# Patient Record
Sex: Male | Born: 1975
Health system: Southern US, Community
[De-identification: ages and names within clinical notes are randomized; demographics above are authoritative.]

## PROBLEM LIST (undated history)

## (undated) DIAGNOSIS — Z8669 Personal history of other diseases of the nervous system and sense organs: Secondary | ICD-10-CM

## (undated) DIAGNOSIS — F909 Attention-deficit hyperactivity disorder, unspecified type: Secondary | ICD-10-CM

## (undated) HISTORY — PX: NASAL SEPTUM SURGERY: SHX37

## (undated) HISTORY — DX: Attention-deficit hyperactivity disorder, unspecified type: F90.9

---

## 2007-07-04 ENCOUNTER — Ambulatory Visit: Admission: RE | Admit: 2007-07-04 | Payer: Self-pay | Source: Ambulatory Visit | Admitting: Plastic Surgery

## 2011-04-16 NOTE — Op Note (Unsigned)
SURGEON:             Babs Bertin, MD      ADMITTED:            07/04/2007      PROCEDURE DATE: 07/04/2007      ROOM NUMBER:         [id number]            CLINICAL NOTE:  This is a pleasant, 35 year old gentleman who has an      annoying enlarging mass above his left brow.  He comes to the operating      room for excision of this.            The patient fully understands the nature of the proposed procedure.  He      understands the possibility of bleeding, infection, scarring, necessity for      secondary surgery, distortion, problems with wound healing, recurrence and      other risks.  Having thus been duly informed, the patient comes in for      indicated procedure.            PREOPERATIVE DIAGNOSIS:  Lipomatous mass, left brow.            PROCEDURE:  Excision of mass.            POSTOPERATIVE DIAGNOSIS:  Lipomatous mass, left brow.            ANESTHESIA:  Local.            OPERATIVE TECHNIQUE:  This patient's face was appropriately prepped and      draped.  An incision was made over the mass.  Deeper dissection was done.      It was submuscular and deep.  This lipomatous-like mass was delivered into      the wound from its deep location over the periosteum and submitted for      pathological study.            Irrigation was done and hemostasis made to be perfect.  Closure was done in      a multilayered plastic manner.  An appropriate dressing was placed, and the      patient returned to the holding area in good condition.            This procedure was done under local anesthesia.  It did go quite smoothly      and a good result is certainly hoped for.  The patient was instructed on      the local care of the wound and will be followed in my office as an      outpatient.                                    ___________________________________     Date Signed: _______________      Babs Bertin, MD                  D 07/04/2007  2:03 P; T 07/05/2007  7:52 A; 4132 - - , [id number], #[id number]      CC:   Moody Bruins, MD           Babs Bertin, MD

## 2011-05-10 ENCOUNTER — Ambulatory Visit (INDEPENDENT_AMBULATORY_CARE_PROVIDER_SITE_OTHER): Payer: No Typology Code available for payment source | Admitting: Family

## 2011-05-10 ENCOUNTER — Ambulatory Visit (INDEPENDENT_AMBULATORY_CARE_PROVIDER_SITE_OTHER): Payer: Self-pay | Admitting: Family Medicine

## 2011-05-10 ENCOUNTER — Encounter (INDEPENDENT_AMBULATORY_CARE_PROVIDER_SITE_OTHER): Payer: Self-pay | Admitting: Family

## 2011-05-10 VITALS — BP 128/84 | HR 79 | Temp 97.8°F | Resp 16 | Ht 68.0 in | Wt 155.8 lb

## 2011-05-10 DIAGNOSIS — J029 Acute pharyngitis, unspecified: Secondary | ICD-10-CM

## 2011-05-10 LAB — POCT RAPID STREP A

## 2011-05-10 MED ORDER — MAGIC MOUTHWASH ORAL SUSPENSION
15.0000 mL | Freq: Three times a day (TID) | ORAL | Status: DC | PRN
Start: 2011-05-10 — End: 2012-02-04

## 2011-05-10 MED ORDER — AMOXICILLIN 875 MG PO TABS
875.00 mg | ORAL_TABLET | Freq: Two times a day (BID) | ORAL | Status: AC
Start: 2011-05-10 — End: 2011-05-20

## 2011-05-10 NOTE — Progress Notes (Signed)
Subjective:       Patient ID: Christopher Trevino is a 35 y.o. male.    Sore Throat   This is a new problem. The current episode started yesterday. The problem has been gradually worsening. There has been no fever. The pain is at a severity of 10/10. The pain is severe. Pertinent negatives include no abdominal pain, congestion, coughing, ear pain, headaches, shortness of breath, trouble swallowing or vomiting. He has had exposure to strep. He has tried nothing for the symptoms. The treatment provided no relief.       The following portions of the patient's history were reviewed and updated as appropriate: allergies, current medications, past family history, past medical history, past social history, past surgical history and problem list.    Review of Systems   Constitutional: Negative for fever, chills and fatigue.   HENT: Negative for ear pain, congestion and trouble swallowing.    Respiratory: Negative for cough and shortness of breath.    Gastrointestinal: Negative for vomiting and abdominal pain.   Musculoskeletal: Positive for myalgias.   Neurological: Negative for headaches.   All other systems reviewed and are negative.            Objective:    Physical Exam   Vitals reviewed.  Constitutional: He is oriented to person, place, and time. He appears well-developed and well-nourished. No distress.   HENT:   Head: Normocephalic.   Right Ear: Tympanic membrane normal.   Left Ear: Tympanic membrane normal.   Mouth/Throat: Uvula is midline and mucous membranes are normal. Posterior oropharyngeal erythema present.   Cardiovascular: Normal rate, regular rhythm and normal heart sounds.    Pulmonary/Chest: Effort normal and breath sounds normal.   Neurological: He is alert and oriented to person, place, and time.   Skin: Skin is warm and dry.   Psychiatric: He has a normal mood and affect. His behavior is normal.           Assessment:       1. Sore throat  Rapid Group A Strep, Upper Respiratory Culture, amoxicillin (AMOXIL)  875 MG tablet, DiphenhydrAMINE HCl (MAGIC MOUTHWASE) suspension           Plan:       Will call with throat culture results  Follow up with PCP within the next week.  Return sooner for any new or concerning symptoms.  Do not take amoxicillin unless you spike a fever of greater than 100.4 or we call you with a positive throat culture.      MEDICATION: AMOXICILLIN  You have been prescribed amoxicillin. Brand names for this drug include: Amoxil and Trimox. This is an antibiotic related to penicillin.  DIRECTIONS FOR USE:  Amoxicillin may be taken with food to prevent upset stomach. Take the medicine at regular intervals. If the label says "every 8 hours", this means3 times per day. Doses do not have to be exactly eight hours apart, but should be spaced out evenly three times a day.  Take all of the medicine until it is gone, even if you are feeling better. This will ensure that the infection is completely cleared up.  WHAT TO WATCH FOR:  POSSIBLE SIDE EFFECTS: Nausea, diarrhea, abdominal pain (contact your doctor if any of these symptoms persist or become severe). Vaginal itching or discharge (contact your doctor).  ALLERGIC REACTION: Rash, itching, swelling, trouble breathing or swallowing (contact your doctor or return to this facility promptly).  MEDICAL CONDITIONS: Before starting this medicine, be sure your  doctor knows if you have any of the following conditions:   Allergic reaction to cephalosporin or penicillin-type drugs in the past   Current infection with mononucleosis   Breastfeeding  DRUG INTERACTION: If you are already taking any of the following medicines, be sure to notify your doctor before taking this drug:   Allopurinol, methotrexate, birth control pills (see below)  WARNING: In rare cases, this medicine may block the effect of birth control pills. Therefore, if you are a woman using birth control pills, you should use another form of contraception during the menstrual cycle(s) in which you are  taking this medicine. Do not stop taking your birth control pills.  [NOTE: This information topic may not include all directions, precautions, medical conditions, drug/food interactions and warnings for this drug. Check with your doctor, nurse, or pharmacist for any questions that you may have.]   2000-2011 [employer], [address] [place], [place] [address]. All rights reserved. This information is not intended as a substitute for professional medical care. Always follow your healthcare professional's instructions.    Self-Care for Sore Throats  Call Your Doctor If You Have:   A temperature over 101.51F   White spots on the throat   Great difficulty swallowing    Trouble breathing   A skin rash   Recent exposure to someone else with strep bacteria   Severe hoarseness and swollen glands in the neck or jaw   Sore throats occur for many reasons, such as colds, allergies, and infections caused by viruses or bacteria. In any case, your throat becomes red and sore. Your goal for self-care is to reduce your discomfort while giving your throat a chance to heal.  Moisten and Soothe Your Throat   Try a sip of water first thing after waking up.   Keep your throat moist by drinking 6 or more glasses of clear liquids every day.   Run a cool-air humidifier in your room overnight.   Suck on throat lozenges, cough drops, hard candy, ice chips, or frozen fruit-juice bars.  Gargle to Ease Irritation  Gargling every hour or two can ease irritation. Try gargling with one of these solutions:   1/4 teaspoon of salt in 1/2 cup of warm water   An over-the-counter anesthetic gargle    Use Medication for More Relief  Over-the-counter medication can reduce sore throat symptoms. Ask your pharmacist if you have questions about which medication to use.   Ease pain with anesthetic sprays. Aspirin or an aspirin substitute also helps. Remember, never give aspirin to anyone 56 or younger.   For sore throats caused by  allergies, try antihistamines to block the allergic reaction.   Remember: unless a sore throat is caused by a bacterial infection, antibiotics won't help you.  Prevent Future Sore Throats   Stop smoking or reduce contact with secondhand smoke. Smoke irritates the tender throat lining.   Limit contact with pets and with allergy-causing substances such as pollen and mold.   When you're around someone with a sore throat or cold, wash your hands frequently to keep viruses or bacteria from spreading.   Don't strain your vocal cords.   [address] [place], [place] [address]. All rights reserved. This information is not intended as a substitute for professional medical care. Always follow your healthcare professional's instructions.

## 2011-05-10 NOTE — Patient Instructions (Addendum)
Will call with throat culture results  Follow up with PCP within the next week.  Return sooner for any new or concerning symptoms.  Do not take amoxicillin unless you spike a fever of greater than 100.4 or we call you with a positive throat culture.      MEDICATION: AMOXICILLIN  You have been prescribed amoxicillin. Brand names for this drug include: Amoxil and Trimox. This is an antibiotic related to penicillin.  DIRECTIONS FOR USE:  Amoxicillin may be taken with food to prevent upset stomach. Take the medicine at regular intervals. If the label says "every 8 hours", this means3 times per day. Doses do not have to be exactly eight hours apart, but should be spaced out evenly three times a day.  Take all of the medicine until it is gone, even if you are feeling better. This will ensure that the infection is completely cleared up.  WHAT TO WATCH FOR:  POSSIBLE SIDE EFFECTS: Nausea, diarrhea, abdominal pain (contact your doctor if any of these symptoms persist or become severe). Vaginal itching or discharge (contact your doctor).  ALLERGIC REACTION: Rash, itching, swelling, trouble breathing or swallowing (contact your doctor or return to this facility promptly).  MEDICAL CONDITIONS: Before starting this medicine, be sure your doctor knows if you have any of the following conditions:   Allergic reaction to cephalosporin or penicillin-type drugs in the past   Current infection with mononucleosis   Breastfeeding  DRUG INTERACTION: If you are already taking any of the following medicines, be sure to notify your doctor before taking this drug:   Allopurinol, methotrexate, birth control pills (see below)  WARNING: In rare cases, this medicine may block the effect of birth control pills. Therefore, if you are a woman using birth control pills, you should use another form of contraception during the menstrual cycle(s) in which you are taking this medicine. Do not stop taking your birth control pills.  [NOTE: This  information topic may not include all directions, precautions, medical conditions, drug/food interactions and warnings for this drug. Check with your doctor, nurse, or pharmacist for any questions that you may have.]   2000-2011 [employer], [address], [place], [place] [address]. All rights reserved. This information is not intended as a substitute for professional medical care. Always follow your healthcare professional's instructions.    Self-Care for Sore Throats  Call Your Doctor If You Have:   A temperature over 101.66F   White spots on the throat   Great difficulty swallowing    Trouble breathing   A skin rash   Recent exposure to someone else with strep bacteria   Severe hoarseness and swollen glands in the neck or jaw   Sore throats occur for many reasons, such as colds, allergies, and infections caused by viruses or bacteria. In any case, your throat becomes red and sore. Your goal for self-care is to reduce your discomfort while giving your throat a chance to heal.  Moisten and Soothe Your Throat   Try a sip of water first thing after waking up.   Keep your throat moist by drinking 6 or more glasses of clear liquids every day.   Run a cool-air humidifier in your room overnight.   Suck on throat lozenges, cough drops, hard candy, ice chips, or frozen fruit-juice bars.  Gargle to Ease Irritation  Gargling every hour or two can ease irritation. Try gargling with one of these solutions:   1/4 teaspoon of salt in 1/2 cup of warm  water   An over-the-counter anesthetic gargle    Use Medication for More Relief  Over-the-counter medication can reduce sore throat symptoms. Ask your pharmacist if you have questions about which medication to use.   Ease pain with anesthetic sprays. Aspirin or an aspirin substitute also helps. Remember, never give aspirin to anyone 78 or younger.   For sore throats caused by allergies, try antihistamines to block the allergic reaction.   Remember: unless a sore  throat is caused by a bacterial infection, antibiotics won't help you.  Prevent Future Sore Throats   Stop smoking or reduce contact with secondhand smoke. Smoke irritates the tender throat lining.   Limit contact with pets and with allergy-causing substances such as pollen and mold.   When you're around someone with a sore throat or cold, wash your hands frequently to keep viruses or bacteria from spreading.   Don't strain your vocal cords.   [address], [address], [place], [place] [address]. All rights reserved. This information is not intended as a substitute for professional medical care. Always follow your healthcare professional's instructions.

## 2011-05-12 LAB — UPPER RESPIRATORY CULTURE

## 2011-05-13 ENCOUNTER — Telehealth (INDEPENDENT_AMBULATORY_CARE_PROVIDER_SITE_OTHER): Payer: Self-pay

## 2011-05-14 NOTE — Telephone Encounter (Signed)
#   listed is disconnected so unable to leave a message on labs

## 2011-07-19 ENCOUNTER — Other Ambulatory Visit (INDEPENDENT_AMBULATORY_CARE_PROVIDER_SITE_OTHER): Payer: Self-pay

## 2011-07-19 NOTE — Telephone Encounter (Signed)
Please advise pt that I do not refill adderall prescription without office visit. Pt will need to be seen before getting rx, due to nature of Rx being controlled category.

## 2011-07-23 ENCOUNTER — Encounter (INDEPENDENT_AMBULATORY_CARE_PROVIDER_SITE_OTHER): Payer: Self-pay | Admitting: Family Medicine

## 2011-07-23 ENCOUNTER — Ambulatory Visit (INDEPENDENT_AMBULATORY_CARE_PROVIDER_SITE_OTHER): Payer: BC Managed Care – PPO | Admitting: Family Medicine

## 2011-07-23 VITALS — BP 132/86 | HR 92 | Ht 67.9 in | Wt 161.2 lb

## 2011-07-23 DIAGNOSIS — F988 Other specified behavioral and emotional disorders with onset usually occurring in childhood and adolescence: Secondary | ICD-10-CM

## 2011-07-23 MED ORDER — AMPHETAMINE-DEXTROAMPHET ER 15 MG PO CP24
15.00 mg | ORAL_CAPSULE | Freq: Every morning | ORAL | Status: DC
Start: 2011-07-23 — End: 2011-08-22

## 2011-07-23 NOTE — Progress Notes (Signed)
Subjective:       Patient ID: Christopher Trevino is a 36 y.o. male.    HPI  This is 36 y/o male with hx of ADD here for first visit with me. Pt states he's taken Adderall during teenage years but has been off for long time. Lately been having difficulty with organizing as well as focusing in afternoons. Pt states he has been involved with multiple projects at same time and having increased stresses as well.   The following portions of the patient's history were reviewed and updated as appropriate: allergies, current medications, past family history, past medical history, past social history, past surgical history and problem list.    Review of Systems  No acute fatigue, no significiant weight changes. Pt denies HA/visual changes/dizziness. No chest pain/SOB/palpitation symptoms. Denies any GI changes. No acute neurologic changes.         Objective:    Physical Exam  Filed Vitals:    07/23/11 1609   BP: 132/86   Pulse: 92   Height: 1.725 m (5' 7.9")   Weight: 73.12 kg (161 lb 3.2 oz)     Normal affect and conversation. NAD. CV: RRR, no murmur or gallops. Resp: CTAB, no rhonchi or crackles. Abdom: Soft, NT/ND. Positive BS. No masses. Ext: no edema. Neuro: Grossly intact.         Assessment:       Encounter Diagnosis   Name Primary?   . Attention deficit disorder (ADD) Yes           Plan:       1. Will start Adderall XR 15mg  daily, take at 9am.   2. Discussed potential effect of insomnia and appetite changes.   3. Return in 1 month for follow up.     Current outpatient prescriptions:amphetamine-dextroamphetamine (ADDERALL XR, 15MG ,) 15 MG 24 hr capsule, Take 1 capsule (15 mg total) by mouth every morning., Disp: 30 capsule, Rfl: 0;  DiphenhydrAMINE HCl (MAGIC MOUTHWASE) suspension, Take 15 mLs by mouth 3 (three) times daily as needed., Disp: 120 mL, Rfl: 0

## 2011-07-27 ENCOUNTER — Telehealth (INDEPENDENT_AMBULATORY_CARE_PROVIDER_SITE_OTHER): Payer: Self-pay

## 2011-07-27 NOTE — Telephone Encounter (Signed)
PA approved for Adderall XR 15 mg daily Dx: ADD.  Pt not currently or recently on MAOI.  Effective 07-27-2011 to 07-26-2012 per [facility]im[facility] @ [facility]

## 2011-07-27 NOTE — Telephone Encounter (Signed)
Message copied by Nonda Lou on Tue Jul 27, 2011  2:44 PM  ------       Message from: Brunilda Payor       Created: Mon Jul 26, 2011  9:38 AM       Regarding: prior auth        Contact: [phone]         PRIOR AUTH REQUIRED- the pharmacy told him to call us and give the nurse the number for [facility].               amphetamine-dextroamphetamine (ADDERALL XR, 15MG ,) 15 MG 24 hr capsule              [facility] [phone]              See's Selena Batten

## 2011-07-28 ENCOUNTER — Encounter (INDEPENDENT_AMBULATORY_CARE_PROVIDER_SITE_OTHER): Payer: Self-pay

## 2011-09-07 ENCOUNTER — Ambulatory Visit (INDEPENDENT_AMBULATORY_CARE_PROVIDER_SITE_OTHER): Payer: No Typology Code available for payment source | Admitting: Family Medicine

## 2011-09-07 ENCOUNTER — Encounter (INDEPENDENT_AMBULATORY_CARE_PROVIDER_SITE_OTHER): Payer: Self-pay | Admitting: Family Medicine

## 2011-09-07 VITALS — BP 136/94 | HR 88 | Wt 157.0 lb

## 2011-09-07 DIAGNOSIS — F988 Other specified behavioral and emotional disorders with onset usually occurring in childhood and adolescence: Secondary | ICD-10-CM

## 2011-09-07 DIAGNOSIS — F909 Attention-deficit hyperactivity disorder, unspecified type: Secondary | ICD-10-CM

## 2011-09-07 DIAGNOSIS — R002 Palpitations: Secondary | ICD-10-CM

## 2011-09-07 MED ORDER — AMPHETAMINE-DEXTROAMPHET ER 15 MG PO CP24
15.00 mg | ORAL_CAPSULE | Freq: Every morning | ORAL | Status: DC
Start: 2011-11-07 — End: 2011-12-07

## 2011-09-07 MED ORDER — AMPHETAMINE-DEXTROAMPHET ER 15 MG PO CP24
15.00 mg | ORAL_CAPSULE | Freq: Every morning | ORAL | Status: DC
Start: 2011-09-07 — End: 2011-10-07

## 2011-09-07 MED ORDER — AMPHETAMINE-DEXTROAMPHET ER 15 MG PO CP24
15.00 mg | ORAL_CAPSULE | Freq: Every morning | ORAL | Status: DC
Start: 2011-10-08 — End: 2011-11-07

## 2011-09-07 NOTE — Patient Instructions (Signed)
VIRAL GASTROENTERITIS (6yr-Adult)    Gastroenteritis is another name for the"stomach flu."It is most often caused by a virus that affects the stomach and intestinal tract. Symptoms include stomach cramping and fever, vomiting and/or diarrhea, and can last from 2 to 7 days.  The danger from repeated vomiting or diarrhea is dehydration. This is the loss of too much water and minerals from the body. When this occurs, body fluids must be replaced. Antibiotics are not effective for this illness, but simple home treatment will be helpful.  HOME CARE   If symptoms are severe, rest at home for the next 24 hours.   Avoid tobacco, caffeine, and alcohol use, which can worsen symptoms.   Acetaminophen (Tylenol) or ibuprofen (Motrin, Advil) may be usedfor fever or pain unless another medication was prescribed. NOTE: If you have chronic liver or kidney disease or ever had a stomach ulcer or GI bleeding, talk with your doctor before using these medicines. Aspirin should never be used in anyone under 18 years of age who is ill with a fever. It may cause severe liver damage.   If medicines for diarrhea or vomiting were prescribed, be sure they are takenonly as directed.   If vomiting, drink small amounts of clear fluids (such as water, sports drinks, clear sodas) at frequent intervals to prevent dehydration. Start with 1 to 2 tablespoons every 10 minutes. Once vomiting stops, follow these guidelines:  DURING THE FIRST 12 to 24 HOURSfollow the diet below:   Beverages: Sport drinks like Gatorade, soft drinks without caffeine; ginger ale, mineral water (plain or flavored), decaffeinated tea and coffee.   Soups: Clear broth, consomm and bouillon   Desserts: Plain gelatin (Jell-O), Popsicles and fruit juice bars.  DURING THE NEXT 24 HOURSyou may add the following to the above:   Hot cereal, plain toast, bread, rolls, crackers   Plain noodles, rice, mashed potatoes, chicken noodle or rice soup   Unsweetened canned  fruit (avoid pineapple), bananas   Limit fat intake to less than 15 grams per day by avoiding margarine, butter, oils, mayonnaise, sauces, gravies, fried foods, peanut butter, meat, poultry, and fish.   Limit fiber; avoid raw or cooked vegetables, fresh fruits (except bananas), and bran cereals.   Limit caffeine and chocolate. Do not use spices or seasonings except salt.  DURING THE NEXT 24 HOURS  The patient can gradually resume a normal diet as symptoms lessen.  PREVENTING SPREAD   Hand washing with soap and water is the best way to prevent the spread of viruses. Caregivers should wash their hands before andafter touching the sick person.   The sick person, as well as everyone in the family,should wash their hands after using the toilet and before meals.   Clean the toilet after each use.   People with diarrhea should not prepare food for others. If you are preparing your own foods, wash your hands before and after.  FOLLOW UP with your doctor as advised. Call your doctor if you are not improving over the next 2 to 3 days. If a stool (diarrhea) sample was taken, you may call in 2 days (or as directed) for the results.  GET PROMPT MEDICAL ATTENTION if any of the following occur:   Increasing abdominal pain   Continued vomiting (unable to keep liquids down)   Frequent diarrhea (more than 5 times a day)   Blood in vomit or stool (black or red color)   Dark urine, reduced urine output, or extreme thirst   Weakness,   dizziness, fainting   Drowsiness, confusion, stiff neck, or seizure   Fever of 100.4F (38C) oral or higher, not better with fever medication   New rash   2000-2011 [employer], [address], [place] [address]. All rights reserved. This information is not intended as a substitute for professional medical care. Always follow your healthcare professional's instructions.

## 2011-09-07 NOTE — Progress Notes (Signed)
Subjective:       Patient ID: Christopher Trevino is a 36 y.o. male.    HPI  This is 36 y/o male with hx of ADD here for follow up. Last seen 1 month ago, was started on Adderall XR 15mg  daily last visit. Pt states he was feeling some anxiety with palpitation last few days, improved after decreasing caffinated beverages. No CP/SOB.   Pt denies any appetite changes, no symptoms of insomnia issues. Pt states that he has been able to concentrate better, and more productive at work. Able to focus more clearly during tasks.     The following portions of the patient's history were reviewed and updated as appropriate: allergies, current medications, past family history, past medical history, past social history, past surgical history and problem list.    Review of Systems  No acute fatigue, no significiant weight changes. Pt denies HA/visual changes/dizziness. No chest pain/SOB/palpitation symptoms. Denies any GI changes. No acute neurologic changes.         Objective:    Physical Exam  BP 136/94  Pulse 88  Wt 71.215 kg (157 lb)  Repeat 124/80     NAD. CV: RRR, no murmur or gallops. Resp: CTAB, no rhonchi or crackles. Abdom: Soft, NT/ND. Positive BS. No masses. Ext: no edema. Neuro: Grossly intact.      Assessment:          Encounter Diagnosis   Name Primary?   . ADD (attention deficit disorder with hyperactivity) Yes     # Palpitation - likely due to caffinated beverages.      Plan:       1. Will continue adderall at same dose.   2. Advised to decrease stimulatory beverages.   3. Return in 3 months for follow up.   I have spent 25 minutes during this visit, more than half of visit spent on patient education and instructions.     Current outpatient prescriptions:amphetamine-dextroamphetamine (ADDERALL XR, 15MG ,) 15 MG 24 hr capsule, Take 1 capsule (15 mg total) by mouth every morning., Disp: 30 capsule, Rfl: 0;  amphetamine-dextroamphetamine (ADDERALL XR, 15MG ,) 15 MG 24 hr capsule, Take 1 capsule (15 mg total) by mouth  every morning., Disp: 30 capsule, Rfl: 0  amphetamine-dextroamphetamine (ADDERALL XR, 15MG ,) 15 MG 24 hr capsule, Take 1 capsule (15 mg total) by mouth every morning., Disp: 30 capsule, Rfl: 0;  DiphenhydrAMINE HCl (MAGIC MOUTHWASE) suspension, Take 15 mLs by mouth 3 (three) times daily as needed., Disp: 120 mL, Rfl: 0

## 2011-11-29 ENCOUNTER — Telehealth (INDEPENDENT_AMBULATORY_CARE_PROVIDER_SITE_OTHER): Payer: Self-pay

## 2011-11-29 ENCOUNTER — Other Ambulatory Visit (INDEPENDENT_AMBULATORY_CARE_PROVIDER_SITE_OTHER): Payer: Self-pay | Admitting: Family Medicine

## 2011-11-29 DIAGNOSIS — J0100 Acute maxillary sinusitis, unspecified: Secondary | ICD-10-CM

## 2011-11-29 MED ORDER — CEFUROXIME AXETIL 500 MG PO TABS
500.00 mg | ORAL_TABLET | Freq: Two times a day (BID) | ORAL | Status: AC
Start: 2011-11-29 — End: 2011-12-09

## 2011-11-29 NOTE — Telephone Encounter (Signed)
Pt adv rx sent to pharm, adv to follow up in 4-5 days if no better.

## 2011-11-29 NOTE — Telephone Encounter (Signed)
Message copied by Tomie China on Mon Nov 29, 2011 12:28 PM  ------       Message from: Brunilda Payor       Created: Mon Nov 29, 2011  8:41 AM       Regarding: antibiotic needed today        Contact: [phone]         Sinus Infection; head pressure, stuffy nose, drainage. Cant come in today, requested an antibiotic called in today               See's Kim               [facility] - [place], [place] - [address] [facility] [address] [phone] (Phone)

## 2011-11-29 NOTE — Telephone Encounter (Signed)
Please advise, thank you, [name]

## 2011-11-29 NOTE — Telephone Encounter (Signed)
Please advise pt that Rx for Ceftin abx transmitted for him to take for 10 days. If not improved in 4-5 days, need to be seen in office.

## 2011-12-10 ENCOUNTER — Encounter (INDEPENDENT_AMBULATORY_CARE_PROVIDER_SITE_OTHER): Payer: BC Managed Care – PPO | Admitting: Family Medicine

## 2011-12-10 ENCOUNTER — Encounter (INDEPENDENT_AMBULATORY_CARE_PROVIDER_SITE_OTHER): Payer: Self-pay | Admitting: Family Medicine

## 2012-02-04 ENCOUNTER — Ambulatory Visit (INDEPENDENT_AMBULATORY_CARE_PROVIDER_SITE_OTHER): Payer: No Typology Code available for payment source | Admitting: Family Medicine

## 2012-02-04 ENCOUNTER — Encounter (INDEPENDENT_AMBULATORY_CARE_PROVIDER_SITE_OTHER): Payer: Self-pay | Admitting: Family Medicine

## 2012-02-04 VITALS — BP 137/83 | HR 88 | Temp 98.3°F | Resp 18 | Ht 67.0 in | Wt 152.0 lb

## 2012-02-04 DIAGNOSIS — Z0000 Encounter for general adult medical examination without abnormal findings: Secondary | ICD-10-CM

## 2012-02-04 DIAGNOSIS — F419 Anxiety disorder, unspecified: Secondary | ICD-10-CM

## 2012-02-04 DIAGNOSIS — F411 Generalized anxiety disorder: Secondary | ICD-10-CM

## 2012-02-04 MED ORDER — SERTRALINE HCL 25 MG PO TABS
25.00 mg | ORAL_TABLET | Freq: Every day | ORAL | Status: DC
Start: 2012-02-04 — End: 2012-09-01

## 2012-02-04 NOTE — Progress Notes (Signed)
Have you self referred yourself since we last saw you? No  -Refer to care team-   Or  List Specialists:    __________________________________________________________          1.  Over the last two weeks, have you been bothered by feeling  down, depressed, or hopeless?  No    2.  Over the last two weeks, have you been bothered by little  interest or pleasure in doing things? No        Scoring:    Not at all: 0  Several days: 1  More than half the days:2  Nearly every day: 3      ONLY IF A 3 IS SCORED ON EITHER OF THE 2 QUESTIONS, THEN GIVE OUT PHQ9

## 2012-02-04 NOTE — Progress Notes (Signed)
Subjective:       Patient ID: Christopher Trevino is a 36 y.o. male.    HPI  This is  36 y/o male with hx of  ADD ,here for CPE. Last PE  1   years ago. Pt state that he is having increased anxiety at work, does not think Adderall is helping too much with his anxiety, may be worsening.     Diet: decreased appetite. Good dinners. Cereal in am. Packs lunch.    Exercise: riding bike. 10-18 miles few times per week.   Sleep: gets about 6-7 hours. occ with stresses.   Mood: some anxiety due to stress. Mental draining but no depression.   Smoking: none             Etoh: beer per night     Illicit drugs: none  Tdap:   Flu Vaccine: none last year.     The following portions of the patient's history were reviewed and updated as appropriate:   No Known Allergies    Past Medical History   Diagnosis Date   . ADHD (attention deficit hyperactivity disorder)    . Depression screening negative 02-04-12     Neg       History     Social History   . Marital Status: Married     Spouse Name: N/A     Number of Children: N/A   . Years of Education: N/A     Occupational History   . Not on file.     Social History Main Topics   . Smoking status: Former Smoker -- 0.5 packs/day for 3 years     Quit date: 05/10/1999   . Smokeless tobacco: Not on file   . Alcohol Use: 3.6 oz/week     6 Cans of beer per week   . Drug Use:    . Sexually Active:      Other Topics Concern   . Not on file     Social History Narrative   . No narrative on file       History reviewed. No pertinent past surgical history.    Patient Active Problem List   Diagnosis   . ADD (attention deficit disorder) without hyperactivity       Current outpatient prescriptions:amphetamine-dextroamphetamine (ADDERALL) 10 MG tablet, Active, Take 10 mg by mouth daily., Disp: , Rfl: ;  sertraline (ZOLOFT) 25 MG tablet, Active, Take 1 tablet (25 mg total) by mouth daily., Disp: 30 tablet, Rfl: 2;  DiphenhydrAMINE HCl (MAGIC MOUTHWASE) suspension, Discontinued, Take 15 mLs by mouth 3 (three) times  daily as needed., Disp: 120 mL, Rfl: 0      Review of Systems  No acute fatigue, no significiant weight changes. Pt denies HA/visual changes/dizziness. No chest pain/SOB/palpitation symptoms. Denies any GI changes. No acute neurologic changes. All other systems were reviewed and was negative.         Objective:    Physical Exam  BP 137/83  Pulse 88  Temp(Src) 98.3 F (36.8 C) (Oral)  Resp 18  Ht 1.702 m (5\' 7" )  Wt 68.947 kg (152 lb)  BMI 23.81 kg/m2  BMI    NAD. HEENT: PERRL. CV: RRR, no murmur or gallops. Resp: CTAB, no rhonchi or crackles. Abdom: Soft, NT/ND. Positive BS. No masses. Ext: no edema. No LAD in cervical/axillary/inguinal area. GU: normal genitals as well as testicular exam bilaterally. No evidence of inguinal hernia.  Neuro: CNII-XII intact, sensation intact. MS 5/5 upper and lower extremities symmetrically.  Assessment:       Encounter Diagnoses   Name Primary?   Marland Kitchen Anxiety    . Routine general medical examination at a health care facility Yes         Plan:       1. Tdap, pt will return to get it done.   2. We have discussed with patient concerning age appropriate health risk factors such as smoking, alcohol intake, and also advised on appropriate diet and exercise patterns. Recommended age appropriate screening exams including yearly PSA, FOBT screening and flu vaccines. Pt was advised to return for repeat PE in 1-2 years for follow up.   3. Will start Zoloft 25mg  daily. Will stop Adderall as pt wants to do trial of SSRI. Pt aware and understands of benefit and risk of med. Return in 6-8 weeks for follow up.     No orders of the defined types were placed in this encounter.

## 2012-02-09 ENCOUNTER — Encounter (INDEPENDENT_AMBULATORY_CARE_PROVIDER_SITE_OTHER): Payer: Self-pay | Admitting: Family Medicine

## 2012-02-09 ENCOUNTER — Ambulatory Visit (INDEPENDENT_AMBULATORY_CARE_PROVIDER_SITE_OTHER): Payer: No Typology Code available for payment source | Admitting: Family Medicine

## 2012-02-09 VITALS — BP 145/95 | HR 67 | Temp 98.3°F | Resp 16 | Wt 150.6 lb

## 2012-02-09 DIAGNOSIS — R221 Localized swelling, mass and lump, neck: Secondary | ICD-10-CM

## 2012-02-09 DIAGNOSIS — Z733 Stress, not elsewhere classified: Secondary | ICD-10-CM

## 2012-02-09 DIAGNOSIS — R220 Localized swelling, mass and lump, head: Secondary | ICD-10-CM

## 2012-02-09 DIAGNOSIS — F439 Reaction to severe stress, unspecified: Secondary | ICD-10-CM

## 2012-02-09 NOTE — Progress Notes (Signed)
Subjective:       Patient ID: Christopher Trevino is a 36 y.o. male.    HPI  This is 36 y/o male otherwise healthy here for evaluation of anterior neck mass for last 2 weeks, has felt some swelling/mass which has been somewhat tender to touch, has gotten mildly smaller. No fever/chills. Has had some neck soreness for last few days but no coughs. Pt currently is a non-smoker. NO recent weight changes.   Anxiety: pt has not started Zoloft, pt would like to cut down on caffinated beverages and reduce stress.     The following portions of the patient's history were reviewed and updated as appropriate:   No Known Allergies    Past Medical History   Diagnosis Date   . ADHD (attention deficit hyperactivity disorder)    . Depression screening negative 02-04-12     Neg       History     Social History   . Marital Status: Married     Spouse Name: N/A     Number of Children: N/A   . Years of Education: N/A     Occupational History   . Not on file.     Social History Main Topics   . Smoking status: Former Smoker -- 0.5 packs/day for 3 years     Quit date: 05/10/1999   . Smokeless tobacco: Not on file   . Alcohol Use: 3.6 oz/week     6 Cans of beer per week   . Drug Use:    . Sexually Active:      Other Topics Concern   . Not on file     Social History Narrative   . No narrative on file       History reviewed. No pertinent past surgical history.    Patient Active Problem List   Diagnosis   (none) - all problems resolved or deleted       Current outpatient prescriptions:amphetamine-dextroamphetamine (ADDERALL) 10 MG tablet, Active, Take 10 mg by mouth daily., Disp: , Rfl: ;  sertraline (ZOLOFT) 25 MG tablet, Active, Take 1 tablet (25 mg total) by mouth daily., Disp: 30 tablet, Rfl: 2      Review of Systems  No acute fatigue, no significiant weight changes. Pt denies HA/visual changes/dizziness. No chest pain/SOB/palpitation symptoms. Denies any GI changes. No acute neurologic changes. All other systems were reviewed and was negative.          Objective:    Physical Exam  BP 145/95  Pulse 67  Temp(Src) 98.3 F (36.8 C) (Oral)  Resp 16  Wt 68.312 kg (150 lb 9.6 oz)  NAD. Neck: Positive semi-mobile mass 1.5 cm nontender in submental area, positive submandibular lymph nodes as well. CV: RRR, no murmur or gallops. Resp: CTAB, no rhonchi or crackles. Abdom: Soft, NT/ND. Positive BS. No masses. Ext: no edema. Skin: no abnormal rash. Neuro: normal gait, normal motor function exam, sensation intact.         Assessment:       Encounter Diagnosis   Name Primary?   . Neck mass Yes           Plan:       1. Discussed with patient concerning need for U/S of neck.   2. Will consider FNA if needed.   3. Pt would like to do trial of no medication for his anxiety. Consider counseling if needed.   4. Return in 2-3 weeks if needed.

## 2012-02-11 ENCOUNTER — Telehealth (INDEPENDENT_AMBULATORY_CARE_PROVIDER_SITE_OTHER): Payer: Self-pay | Admitting: Family Medicine

## 2012-02-11 NOTE — Telephone Encounter (Signed)
L/M on pt home phone to call back

## 2012-02-11 NOTE — Telephone Encounter (Signed)
Advise pt that u/s showed nodule under chin came back as lymph node with benign shape and morphology. I suggest no drastic procedures at this time, will consider biopsy if having continued sx in 1-2 months. Pt to call back if continuing to enlarge during this time.

## 2012-02-14 NOTE — Telephone Encounter (Signed)
I spoke to the pt about his u/s results, he was advised to come into the office if his sx cont in the next 1-2 mos, or if it conts to enlarge.

## 2012-02-21 ENCOUNTER — Encounter (INDEPENDENT_AMBULATORY_CARE_PROVIDER_SITE_OTHER): Payer: Self-pay

## 2012-04-27 ENCOUNTER — Ambulatory Visit (INDEPENDENT_AMBULATORY_CARE_PROVIDER_SITE_OTHER): Payer: No Typology Code available for payment source | Admitting: Family Medicine

## 2012-09-01 ENCOUNTER — Ambulatory Visit (INDEPENDENT_AMBULATORY_CARE_PROVIDER_SITE_OTHER): Payer: No Typology Code available for payment source | Admitting: Family Medicine

## 2012-09-01 ENCOUNTER — Encounter (INDEPENDENT_AMBULATORY_CARE_PROVIDER_SITE_OTHER): Payer: Self-pay | Admitting: Family Medicine

## 2012-09-01 VITALS — BP 138/88 | HR 66 | Temp 98.3°F | Resp 16 | Ht 66.93 in | Wt 147.2 lb

## 2012-09-01 DIAGNOSIS — R030 Elevated blood-pressure reading, without diagnosis of hypertension: Secondary | ICD-10-CM | POA: Insufficient documentation

## 2012-09-01 DIAGNOSIS — Z131 Encounter for screening for diabetes mellitus: Secondary | ICD-10-CM

## 2012-09-01 DIAGNOSIS — Z13220 Encounter for screening for lipoid disorders: Secondary | ICD-10-CM

## 2012-09-01 NOTE — Progress Notes (Signed)
Have you self referred yourself since we last saw you?   No    Refer to care team or add specialist:

## 2012-09-01 NOTE — Progress Notes (Signed)
Subjective:       Patient ID: Christopher Trevino is a 37 y.o. male.    HPI  This is  37  y/o male otherwise healthy ,here for CPE. Last PE  1  years ago.     Diet: 1 cup of coffe in am.   Exercise: biking 3-4 times per week.   Sleep: gets about 7 hours. No OSA.   Mood: good. Improved with diet changes. Level.   Smoking:  none            Etoh: 4 beers per week.      Illicit drugs: none.   Last Colonoscopy: no family hx of colon ca.   Tdap: 2002.   Flu Vaccine: none      The following portions of the patient's history were reviewed and updated as appropriate:   Current/Home Medications    No medications on file       No Known Allergies    Past Medical History   Diagnosis Date   . ADHD (attention deficit hyperactivity disorder)    . Depression screening negative 02-04-12     Neg       History     Social History   . Marital Status: Married     Spouse Name: N/A     Number of Children: N/A   . Years of Education: N/A     Occupational History   . Not on file.     Social History Main Topics   . Smoking status: Former Smoker -- 0.5 packs/day for 3 years     Quit date: 05/10/1999   . Smokeless tobacco: Not on file   . Alcohol Use: 3.6 oz/week     6 Cans of beer per week   . Drug Use:    . Sexually Active:      Other Topics Concern   . Not on file     Social History Narrative   . No narrative on file       No past surgical history on file.    Patient Active Problem List   Diagnosis   (none) - all problems resolved or deleted           Review of Systems  Pt denies any significant weight changes, fatigue or weakness. Denies acute SOB/Palp/CP. No PND or orthopnea symptoms. No abdominal pain or discomfort. Denies any bowel changes such as contipation/diarrhea/melena/hematochezia. Denies any acute skin sensation or muscular strength changes or problems. All other systems were reviewed and were negative.         Objective:    Physical Exam  BP 138/88  Pulse 66  Temp 98.3 F (36.8 C) (Oral)  Resp 16  Ht 1.7 m (5' 6.93")  Wt 66.769  kg (147 lb 3.2 oz)  BMI 23.10 kg/m2  SpO2 99%  BMI    NAD. HEENT: PERRL. CV: RRR, no murmur or gallops. Resp: CTAB, no rhonchi or crackles. Abdom: Soft, NT/ND. Positive BS. No masses. Ext: no edema. No LAD in cervical/axillary/inguinal area. GU: normal genitals as well as testicular exam bilaterally. No evidence of inguinal hernia. Rectal exam: No evidence of external hemorrhoids, bilateral prostate wnl without nodules/masses/tenderness. Neuro: CNII-XII intact, sensation intact. MS 5/5 upper and lower extremities symmetrically.           Assessment:       Encounter Diagnoses   Name Primary?   . Routine general medical examination at a health care facility Yes   . Encounter for screening  for lipoid disorders    . Screening for diabetes mellitus    . Need for diphtheria-tetanus-pertussis (Tdap) vaccine, adult/adolescent    . Elevated blood pressure reading without diagnosis of hypertension            Plan:       1. Will update tetanus today.  2. We'll check following labs.  3. We have discussed with patient concerning age appropriate health risk factors such as smoking, alcohol intake, and also advised on appropriate diet and exercise patterns. Recommended age appropriate screening exams including yearly flu vaccines. Pt was advised to return for repeat PE in 1-2 years for follow up.     Orders Placed This Encounter   Procedures   . Tdap vaccine greater than or equal to 7yo IM   . Comprehensive metabolic panel     Order Specific Question:  Has the patient fasted?     Answer:  Yes   . Lipid panel     Order Specific Question:  Has the patient fasted?     Answer:  Yes     r

## 2012-09-02 LAB — COMPREHENSIVE METABOLIC PANEL
ALT: 14 U/L (ref 9–46)
AST (SGOT): 14 U/L (ref 10–40)
Albumin/Globulin Ratio: 2 (ref 1.0–2.5)
Albumin: 4.8 G/DL (ref 3.6–5.1)
Alkaline Phosphatase: 95 U/L (ref 40–115)
BUN: 13 MG/DL (ref 7–25)
Bilirubin, Total: 0.9 MG/DL (ref 0.2–1.2)
CO2: 27 mmol/L (ref 19–30)
Calcium: 10.1 MG/DL (ref 8.6–10.3)
Chloride: 103 mmol/L (ref 98–110)
Creatinine: 0.87 mg/dL (ref 0.60–1.35)
EGFR African American: 129 mL/min/{1.73_m2} (ref >=60)
EGFR: 111 mL/min/{1.73_m2} (ref >=60)
Globulin: 2.4 G/DL (ref 1.9–3.7)
Glucose: 100 MG/DL — ABNORMAL HIGH (ref 65–99)
Potassium: 4.5 mmol/L (ref 3.5–5.3)
Protein, Total: 7.2 G/DL (ref 6.1–8.1)
Sodium: 139 mmol/L (ref 135–146)

## 2012-09-02 LAB — LIPID PANEL
Cholesterol / HDL Ratio: 2 (ref 0.0–5.0)
Cholesterol: 133 MG/DL (ref 125–200)
HDL: 65 MG/DL (ref >=40)
LDL Calculated: 51 MG/DL (ref <130)
Non HDL Cholesterol (LDL and VLDL): 68 mg/dL
Triglycerides: 83 MG/DL (ref <150)

## 2012-09-03 ENCOUNTER — Encounter (INDEPENDENT_AMBULATORY_CARE_PROVIDER_SITE_OTHER): Payer: Self-pay | Admitting: Family Medicine

## 2013-04-17 ENCOUNTER — Encounter (INDEPENDENT_AMBULATORY_CARE_PROVIDER_SITE_OTHER): Payer: Self-pay

## 2013-09-05 ENCOUNTER — Ambulatory Visit (INDEPENDENT_AMBULATORY_CARE_PROVIDER_SITE_OTHER): Payer: No Typology Code available for payment source | Admitting: Family Medicine

## 2013-09-05 ENCOUNTER — Other Ambulatory Visit (INDEPENDENT_AMBULATORY_CARE_PROVIDER_SITE_OTHER): Payer: Self-pay | Admitting: Family Medicine

## 2013-09-05 ENCOUNTER — Encounter (INDEPENDENT_AMBULATORY_CARE_PROVIDER_SITE_OTHER): Payer: Self-pay | Admitting: Family Medicine

## 2013-09-05 VITALS — BP 133/76 | HR 62 | Temp 98.7°F | Ht 67.0 in | Wt 143.0 lb

## 2013-09-05 DIAGNOSIS — Z131 Encounter for screening for diabetes mellitus: Secondary | ICD-10-CM

## 2013-09-05 DIAGNOSIS — Z0000 Encounter for general adult medical examination without abnormal findings: Secondary | ICD-10-CM

## 2013-09-05 DIAGNOSIS — Z13220 Encounter for screening for lipoid disorders: Secondary | ICD-10-CM

## 2013-09-05 NOTE — Progress Notes (Signed)
Subjective:       Patient ID: Christopher Trevino is a 38 y.o. male.    HPI    This is  38  y/o male otherwise healthy ,here for CPE. Last PE  1  years ago. HTN in family including father, mom.     Diet: 1 cup of coffe in am. Vegan/vegetarian. occ fish.   Exercise: biking 3 times per week. 80 miles a week. Has joined gym 1 week ago.   Sleep: gets about 7-8 hours. No OSA. No urinary issues.   Mood: good. Improved with diet changes. Not much stressor.   Smoking:  none            Etoh: 4 beers/wine per week.      Illicit drugs: none.   Last Colonoscopy: no family hx of colon ca.   Tdap: 2014.   Flu Vaccine: 03/2013      The following portions of the patient's history were reviewed and updated as appropriate:   Current/Home Medications    No medications on file       No Known Allergies    Past Medical History   Diagnosis Date   . ADHD (attention deficit hyperactivity disorder)    . Depression screening negative 02-04-12     Neg       History     Social History   . Marital Status: Married     Spouse Name: N/A     Number of Children: N/A   . Years of Education: N/A     Occupational History   . Not on file.     Social History Main Topics   . Smoking status: Former Smoker -- 0.5 packs/day for 3 years     Quit date: 05/10/1999   . Smokeless tobacco: Not on file   . Alcohol Use: 3.6 oz/week     6 Cans of beer per week   . Drug Use:    . Sexually Active:      Other Topics Concern   . Not on file     Social History Narrative   . No narrative on file       No past surgical history on file.    Patient Active Problem List   Diagnosis   . Elevated blood pressure reading without diagnosis of hypertension           Review of Systems    Pt denies any significant weight changes, fatigue or weakness. Denies acute SOB/Palp/CP. No PND or orthopnea symptoms. No abdominal pain or discomfort. Denies any bowel changes such as contipation/diarrhea/melena/hematochezia. Denies any acute skin sensation or muscular strength changes or problems. All  other systems were reviewed and were negative.         Objective:    Physical Exam    BP 133/76  Pulse 62  Temp 98.7 F (37.1 C) (Oral)  Ht 1.702 m (5\' 7" )  Wt 64.864 kg (143 lb)  BMI 22.39 kg/m2  BMI    NAD. HEENT: PERRL. CV: RRR, no murmur or gallops. Resp: CTAB, no rhonchi or crackles. Abdom: Soft, NT/ND. Positive BS. No masses. Ext: no edema. No LAD in cervical/axillary/inguinal area. GU: normal genitals as well as testicular exam bilaterally. No evidence of inguinal hernia.  Neuro: CNII-XII intact, sensation intact. MS 5/5 upper and lower extremities symmetrically.           Assessment:       Encounter Diagnoses   Name Primary?   . Routine general medical  examination at a health care facility Yes   . Screening for diabetes mellitus    . Screening for lipoid disorders            Plan:         1.  We have discussed with patient concerning age appropriate health risk factors such as smoking, alcohol intake, and also advised on appropriate diet and exercise patterns. Recommended age appropriate screening exams including yearly flu vaccines. Pt was advised to return for repeat PE in 1-2 years for follow up.     Orders Placed This Encounter   Procedures   . Comprehensive metabolic panel     Order Specific Question:  Has the patient fasted?     Answer:  Yes   . Lipid panel     Order Specific Question:  Has the patient fasted?     Answer:  Yes

## 2013-09-05 NOTE — Patient Instructions (Signed)
Low-Salt Diet (2 Grams/Day)  This diet eliminates foods that are high in salt and restricts the amount of salt that you cook with. It is most often used for patients with high blood pressure, edema (fluid retention), kidney, liver, and heart disease.  Table salt contains the mineral sodium. The body needs sodium to work normally. But too much sodium can make your health problems worse. Your healthcare provider is recommending a low-salt (also called low-sodium) diet for you. Your total daily allowance of salt (sodium) is 2 grams. This equals 2,000 milligrams (mg). It is less than 1 teaspoon of table salt. This means you can have only about 700 mg of sodium at each meal.  When you cook, limit the salt you use. And if you can avoid using salt, even better. Do not add salt at the table. So, throw away the saltshaker!  When shopping, read the package labels. Salt is often called "sodium" on the label. Choose foods that are Salt-Free, Low Salt, or Very Low Salt. Note that foods with Reduced Salt may not lower your salt intake enough.    Beverages  OK: Tea, coffee, carbonated beverages, juices  AVOID: Flavored international coffees, electrolyte replacement drinks, sports beverages  Bread & Cereals  OK: All regular bread, rolls, cereals, cakes; low-salt crackers, matzoh crackers  AVOID: Salted crackers, pretzels, popcorn; french toast, pancakes, muffins  Fruits & Desserts  OK: Ice cream, frozen yogurt, juice bars, gelatin (Jell-O), cookies and pies, sugar, honey, jelly, hard candy  AVOID: Most pies, cakes and cookies prepared or processed with salt, instant pudding  Meats  OK: All fresh meat, fish, poultry, low-salt tuna  AVOID: Smoked, pickled, brine-cured, or salted meats or fish. This includes bacon, chipped beef, corned beef, hot dogs, luncheon meats, ham, kosher meats, salt pork, sausage, canned tuna, salted codfish, smoked salmon, herring, sardines, or anchovies.  Dairy  OK: Milk, chocolate milk, hot chocolate mix;  eggs, "Low Salt" cheeses, yogurt, egg substitute  AVOID: Processed cheese, cheese spreads, Roquefort, Camembert, and cottage cheese, buttermilk, instant breakfast drink  Beans, Potatoes & Pasta  OK: Dry beans, split peas, lentils, potatoes, rice, macaroni, noodles, spaghetti without added salt  AVOID: Potato chips, tortilla chips, and similar products  Soups  OK: Low-salt soups and broths made with allowed foods  AVOID: Bouillon cubes, soups with smoked or salted meats, regular soup and broth  Vegetables  OK: Most are okay; low-salt tomato and vegetable juices  AVOID: Sauerkraut and other brine-soaked vegetables, pickles and other pickled vegetables, tomato juice, olives  Seasoning & Spices  OK: Most seasonings are okay. Good substitutes for salt include: fresh herb blends, Tabasco, lemon, garlic, curry, vinegar, dry mustard, parsley, cilantro, horseradish, tomato paste, regular margarine, mayonnaise, butter, cream cheese, vegetable oil, cream, low-salt salad dressing and gravy  AVOID: Regular ketchup, relishes, pickles, soy sauce, teriyaki sauce, Worcestershire sauce, BBQ sauce, tartar sauce, meat tenderizer, chili sauce, regular gravy, regular salad dressing   2000-2014 [employer], [address], [place] [address]. All rights reserved. This information is not intended as a substitute for professional medical care. Always follow your healthcare professional's instructions.

## 2013-09-06 LAB — LIPID PANEL
Cholesterol / HDL Ratio: 2.1 ratio units (ref 0.0–5.0)
Cholesterol: 143 mg/dL (ref 100–199)
HDL: 68 mg/dL (ref >39)
LDL Calculated: 62 mg/dL (ref 0–99)
Triglycerides: 63 mg/dL (ref 0–149)
VLDL Calculated: 13 mg/dL (ref 5–40)

## 2013-09-06 LAB — COMPREHENSIVE METABOLIC PANEL
ALT: 13 IU/L (ref 0–44)
AST (SGOT): 15 IU/L (ref 0–40)
Albumin/Globulin Ratio: 2.8 — ABNORMAL HIGH (ref 1.1–2.5)
Albumin: 4.8 g/dL (ref 3.5–5.5)
Alkaline Phosphatase: 79 IU/L (ref 39–117)
BUN / Creatinine Ratio: 12 (ref 8–19)
BUN: 12 mg/dL (ref 6–20)
Bilirubin, Total: 0.8 mg/dL (ref 0.0–1.2)
CO2: 24 mmol/L (ref 18–29)
Calcium: 9.8 mg/dL (ref 8.7–10.2)
Chloride: 101 mmol/L (ref 97–108)
Creatinine: 0.99 mg/dL (ref 0.76–1.27)
EGFR: 112 mL/min/{1.73_m2} (ref >59)
EGFR: 97 mL/min/{1.73_m2} (ref >59)
Globulin, Total: 1.7 g/dL (ref 1.5–4.5)
Glucose: 103 mg/dL — ABNORMAL HIGH (ref 65–99)
Potassium: 4.4 mmol/L (ref 3.5–5.2)
Protein, Total: 6.5 g/dL (ref 6.0–8.5)
Sodium: 142 mmol/L (ref 134–144)

## 2014-11-14 ENCOUNTER — Encounter (INDEPENDENT_AMBULATORY_CARE_PROVIDER_SITE_OTHER): Payer: No Typology Code available for payment source | Admitting: Family Medicine

## 2015-07-03 ENCOUNTER — Encounter (INDEPENDENT_AMBULATORY_CARE_PROVIDER_SITE_OTHER): Payer: Self-pay | Admitting: Family Medicine

## 2015-07-03 ENCOUNTER — Ambulatory Visit (FREE_STANDING_LABORATORY_FACILITY): Payer: No Typology Code available for payment source | Admitting: Family Medicine

## 2015-07-03 VITALS — BP 132/86 | HR 60 | Temp 98.2°F | Ht 67.0 in | Wt 146.6 lb

## 2015-07-03 DIAGNOSIS — Z0000 Encounter for general adult medical examination without abnormal findings: Secondary | ICD-10-CM

## 2015-07-03 DIAGNOSIS — Z131 Encounter for screening for diabetes mellitus: Secondary | ICD-10-CM

## 2015-07-03 DIAGNOSIS — G44209 Tension-type headache, unspecified, not intractable: Secondary | ICD-10-CM

## 2015-07-03 DIAGNOSIS — Z13220 Encounter for screening for lipoid disorders: Secondary | ICD-10-CM

## 2015-07-03 DIAGNOSIS — Z130 Encounter for screening for diseases of the blood and blood-forming organs and certain disorders involving the immune mechanism: Secondary | ICD-10-CM

## 2015-07-03 DIAGNOSIS — Z125 Encounter for screening for malignant neoplasm of prostate: Secondary | ICD-10-CM

## 2015-07-03 LAB — COMPREHENSIVE METABOLIC PANEL
ALT: 15 U/L (ref 0–55)
AST (SGOT): 15 U/L (ref 5–34)
Albumin/Globulin Ratio: 1.6 (ref 0.9–2.2)
Albumin: 4.2 g/dL (ref 3.5–5.0)
Alkaline Phosphatase: 86 U/L (ref 38–106)
BUN: 12 mg/dL (ref 9.0–28.0)
Bilirubin, Total: 0.6 mg/dL (ref 0.1–1.2)
CO2: 27 mEq/L (ref 21–30)
Calcium: 9.6 mg/dL (ref 8.5–10.5)
Chloride: 105 mEq/L (ref 100–111)
Creatinine: 0.9 mg/dL (ref 0.5–1.5)
Globulin: 2.7 g/dL (ref 2.0–3.7)
Glucose: 102 mg/dL — ABNORMAL HIGH (ref 70–100)
Potassium: 4.6 mEq/L (ref 3.5–5.3)
Protein, Total: 6.9 g/dL (ref 6.0–8.3)
Sodium: 140 mEq/L (ref 135–146)

## 2015-07-03 LAB — PSA: Prostate Specific Antigen, Total: 0.319 ng/mL (ref 0.000–4.000)

## 2015-07-03 LAB — LIPID PANEL
Cholesterol / HDL Ratio: 2.5
Cholesterol: 162 mg/dL (ref 0–199)
HDL: 64 mg/dL (ref 40–9999)
LDL Calculated: 85 mg/dL (ref 0–99)
Triglycerides: 67 mg/dL (ref 34–149)
VLDL Calculated: 13 mg/dL (ref 10–40)

## 2015-07-03 LAB — CBC AND DIFFERENTIAL
Basophils Absolute Automated: 0.03 10*3/uL (ref 0.00–0.20)
Basophils Automated: 1 %
Eosinophils Absolute Automated: 0.07 10*3/uL (ref 0.00–0.70)
Eosinophils Automated: 2 %
Hematocrit: 46.8 % (ref 42.0–52.0)
Hgb: 15.7 g/dL (ref 13.0–17.0)
Immature Granulocytes Absolute: 0.01 10*3/uL
Immature Granulocytes: 0 %
Lymphocytes Absolute Automated: 1.66 10*3/uL (ref 0.50–4.40)
Lymphocytes Automated: 37 %
MCH: 31 pg (ref 28.0–32.0)
MCHC: 33.5 g/dL (ref 32.0–36.0)
MCV: 92.3 fL (ref 80.0–100.0)
MPV: 12.2 fL (ref 9.4–12.3)
Monocytes Absolute Automated: 0.34 10*3/uL (ref 0.00–1.20)
Monocytes: 8 %
Neutrophils Absolute: 2.35 10*3/uL (ref 1.80–8.10)
Neutrophils: 53 %
Nucleated RBC: 0 /100 WBC (ref 0–1)
Platelets: 220 10*3/uL (ref 140–400)
RBC: 5.07 10*6/uL (ref 4.70–6.00)
RDW: 12 % (ref 12–15)
WBC: 4.46 10*3/uL (ref 3.50–10.80)

## 2015-07-03 LAB — GFR: EGFR: >60

## 2015-07-03 LAB — HEMOLYSIS INDEX: Hemolysis Index: 8 (ref 0–18)

## 2015-07-03 MED ORDER — CYCLOBENZAPRINE HCL 10 MG PO TABS
10.0000 mg | ORAL_TABLET | Freq: Every evening | ORAL | Status: DC
Start: 2015-07-03 — End: 2016-04-30

## 2015-07-03 NOTE — Progress Notes (Signed)
Subjective:       Patient ID: Christopher Trevino is a 40 y.o. male.    Headache       This is  40  y/o male otherwise healthy ,here for CPE. Last PE  1  years ago. HTN in family including father, mom. Having headache sensation, left upper posterior headache sensation. Coming on in the afternoon.     Diet: 2 cups coffee per day. Water also.  Vegan/vegetarian. Fish as well.    Exercise: biking.    Sleep: gets about 7-8 hours. No OSA. No urinary issues.   Mood:  Improved with diet changes. Not much stresses.    Smoking:  none            Etoh: minimal.       Illicit drugs: none.   Last Colonoscopy: no family hx of colon ca.   Tdap: 2014.   Flu Vaccine: none.     occ elbow popping.,   '  Chemistry        Component Value Date/Time    NA 142 09/05/2013 0906    K 4.4 09/05/2013 0906    CL 101 09/05/2013 0906    CL 103 09/01/2012 1003    CO2 24 09/05/2013 0906    BUN 12 09/05/2013 0906    CREAT 0.99 09/05/2013 0906    CREAT 0.87 09/01/2012 1003    GLU 103* 09/05/2013 0906    GLU 100* 09/01/2012 1003        Component Value Date/Time    CA 9.8 09/05/2013 0906    ALKPHOS 79 09/05/2013 0906    AST 15 09/05/2013 0906    ALT 13 09/05/2013 0906    BILITOTAL 0.8 09/05/2013 0906        Lab Results   Component Value Date    CHOL 143 09/05/2013    CHOL 133 09/01/2012     Lab Results   Component Value Date    HDL 68 09/05/2013    HDL 65 09/01/2012     Lab Results   Component Value Date    LDL 62 09/05/2013    LDL 51 09/01/2012     Lab Results   Component Value Date    TRIG 63 09/05/2013    TRIG 83 09/01/2012         The following portions of the patient's history were reviewed and updated as appropriate:   Current/Home Medications    No medications on file       No Known Allergies    Past Medical History   Diagnosis Date   . ADHD (attention deficit hyperactivity disorder)    . Depression screening negative 02-04-12     Neg       Social History     Social History   . Marital Status: Married     Spouse Name: N/A   . Number of Children: N/A   .  Years of Education: N/A     Occupational History   . Not on file.     Social History Main Topics   . Smoking status: Former Smoker -- 0.50 packs/day for 3 years     Quit date: 05/10/1999   . Smokeless tobacco: Not on file   . Alcohol Use: 3.6 oz/week     6 Cans of beer per week   . Drug Use: Not on file   . Sexual Activity: Not on file     Other Topics Concern   . Not on file  Social History Narrative       No past surgical history on file.    Patient Active Problem List   Diagnosis   . Elevated blood pressure reading without diagnosis of hypertension           Review of Systems   Neurological: Positive for headaches.     Pt denies any significant weight changes, fatigue or weakness. Denies acute SOB/Palp/CP. No PND or orthopnea symptoms. No abdominal pain or discomfort. Denies any bowel changes such as contipation/diarrhea/melena/hematochezia. Denies any acute skin sensation or muscular strength changes or problems. All other systems were reviewed and were negative.         Objective:    Physical Exam    BP 132/86 mmHg  Pulse 60  Temp(Src) 98.2 F (36.8 C) (Oral)  Ht 1.702 m (5\' 7" )  Wt 66.497 kg (146 lb 9.6 oz)  BMI 22.96 kg/m2  BMI    NAD. HEENT: PERRL. CV: RRR, no murmur or gallops. Resp: CTAB, no rhonchi or crackles. Abdom: Soft, NT/ND. Positive BS. No masses. Ext: no edema. No LAD in cervical/axillary/inguinal area. GU: normal genitals as well as testicular exam bilaterally. No evidence of inguinal hernia. normal prostate exam. Neuro: CNII-XII intact, sensation intact. MS 5/5 upper and lower extremities symmetrically. TTP along right medial epicondyle process.           Assessment:       Encounter Diagnoses   Name Primary?   . Annual physical exam Yes   . Screening for deficiency anemia    . Tension-type headache, not intractable, unspecified chronicity pattern    . Screening for lipoid disorders    . Screening for prostate cancer    . Screening for diabetes mellitus      # right golfer's elbow         Plan:         1.  We have discussed with patient concerning age appropriate health risk factors such as smoking, alcohol intake, and also advised on appropriate diet and exercise patterns. Recommended age appropriate screening exams including yearly flu vaccines. Pt was advised to return for repeat PE in 1-2 years for follow up.   2. Exercise for neck as well as elbow information given to patient. Consider imaging if having persisting issues.     Orders Placed This Encounter   Procedures   . Comprehensive metabolic panel     Order Specific Question:  Has the patient fasted?     Answer:  Yes   . Lipid panel     Order Specific Question:  Has the patient fasted?     Answer:  Yes   . PSA   . CBC and differential   . Hemolysis index     Has the patient fasted?->Yes   . GFR     Has the patient fasted?->Yes                Procedures

## 2015-07-04 ENCOUNTER — Encounter (INDEPENDENT_AMBULATORY_CARE_PROVIDER_SITE_OTHER): Payer: Self-pay | Admitting: Family Medicine

## 2016-01-02 ENCOUNTER — Telehealth (INDEPENDENT_AMBULATORY_CARE_PROVIDER_SITE_OTHER): Payer: Self-pay | Admitting: Family Medicine

## 2016-01-02 NOTE — Telephone Encounter (Signed)
Patient states that he is going to stop cipro because he is having joint pain. States that his wound looks good and does not feel the need to be on it. He states that his wound is "scabbed over" and he has a water proof bandage on it because [place] the beach and will f/u in the office for a wound check and stitch removal.

## 2016-01-02 NOTE — Telephone Encounter (Signed)
Please advise 

## 2016-01-02 NOTE — Telephone Encounter (Signed)
Patient called back states he just spoke with the nurse (see encounter below) and that he would like to go on another antibiotic.  Please call patient ASAP to advise at [phone]

## 2016-01-02 NOTE — Telephone Encounter (Signed)
Pt got stitches over the weekend and was also put on Cipro. He has questions about the Cipro and would like to speak to someone. He can be reached at [phone].    Thanks,  [employer]

## 2016-01-02 NOTE — Telephone Encounter (Signed)
Tc with patient, advised okay to stop Cipro as wound is looking very clean. Will follow up in office next week for suture removal.

## 2016-01-08 ENCOUNTER — Ambulatory Visit (INDEPENDENT_AMBULATORY_CARE_PROVIDER_SITE_OTHER): Payer: No Typology Code available for payment source | Admitting: Family Medicine

## 2016-03-22 ENCOUNTER — Other Ambulatory Visit: Payer: Self-pay | Admitting: Otolaryngology

## 2016-03-22 DIAGNOSIS — M779 Enthesopathy, unspecified: Secondary | ICD-10-CM

## 2016-03-23 ENCOUNTER — Ambulatory Visit: Payer: No Typology Code available for payment source | Attending: Otolaryngology

## 2016-03-23 DIAGNOSIS — M779 Enthesopathy, unspecified: Secondary | ICD-10-CM

## 2016-03-23 DIAGNOSIS — H9312 Tinnitus, left ear: Secondary | ICD-10-CM | POA: Insufficient documentation

## 2016-03-23 DIAGNOSIS — H905 Unspecified sensorineural hearing loss: Secondary | ICD-10-CM | POA: Insufficient documentation

## 2016-03-23 MED ORDER — GADOBUTROL 1 MMOL/ML IV SOLN
6.5000 mL | Freq: Once | INTRAVENOUS | Status: AC | PRN
Start: 2016-03-23 — End: 2016-03-23
  Administered 2016-03-23: 6.5 mmol via INTRAVENOUS
  Filled 2016-03-23: qty 7.5

## 2016-04-30 ENCOUNTER — Encounter (INDEPENDENT_AMBULATORY_CARE_PROVIDER_SITE_OTHER): Payer: Self-pay | Admitting: Family Medicine

## 2016-04-30 ENCOUNTER — Ambulatory Visit (INDEPENDENT_AMBULATORY_CARE_PROVIDER_SITE_OTHER): Payer: No Typology Code available for payment source | Admitting: Family Medicine

## 2016-04-30 VITALS — BP 121/79 | HR 71 | Temp 98.0°F | Wt 149.2 lb

## 2016-04-30 DIAGNOSIS — R030 Elevated blood-pressure reading, without diagnosis of hypertension: Secondary | ICD-10-CM

## 2016-04-30 DIAGNOSIS — F900 Attention-deficit hyperactivity disorder, predominantly inattentive type: Secondary | ICD-10-CM

## 2016-04-30 MED ORDER — LISDEXAMFETAMINE DIMESYLATE 30 MG PO CAPS
30.0000 mg | ORAL_CAPSULE | Freq: Every morning | ORAL | 0 refills | Status: DC
Start: 2016-04-30 — End: 2016-04-30

## 2016-04-30 MED ORDER — AMPHETAMINE-DEXTROAMPHET ER 10 MG PO CP24
10.0000 mg | ORAL_CAPSULE | Freq: Every day | ORAL | 0 refills | Status: DC
Start: 2016-04-30 — End: 2016-05-25

## 2016-04-30 NOTE — Progress Notes (Signed)
Have you seen any new outside specialists? "No" pt aware due for flu vacc

## 2016-04-30 NOTE — Progress Notes (Signed)
Subjective:      Patient ID:  Christopher Trevino is a 40  y/o male     Christopher Trevino is a 40  y/o mal presenting for No chief complaint on file.      HPI    Attention deficit and hyperactivity disorder  The patient reports that he has been treated with adderall in the pas which was helpful and he has been off of it recently and would like to restart the medication.    The patient reports that their ADHD medication has been very effective in controlling their symptoms.  Specifically, it is helpful with improving concentration and ability to get tasks done throughout the day.  It is helpful with not being forgetful with the details of tasks and helps complete work over sustained periods of time.      History of elevated blood pressure  The patient reports that he has not had chest pain, shortness of breath and lightheadedness with this medication.  The patient denies any elevated blood pressures.    The following portions of the patient's history were reviewed and updated as appropriate: current medications, allergies, past family history, past medical history, past social history, past surgical history and problem list.     Review of Systems   Constitutional: Negative for chills, diaphoresis and fever.   Eyes: Negative for visual disturbance.   Respiratory: Negative for cough and shortness of breath.    Cardiovascular: Negative for chest pain, palpitations and leg swelling.   Gastrointestinal: Negative for abdominal pain and blood in stool.   Genitourinary: Negative for dysuria, hematuria, pelvic pain and vaginal bleeding.   Skin: Negative for color change.   Neurological: Negative for dizziness, syncope, light-headedness and headaches.   Psychiatric/Behavioral: Negative for confusion and dysphoric mood.        BP 121/79 (BP Site: Left arm, Patient Position: Sitting, Cuff Size: Medium)   Pulse 71   Temp 98 F (36.7 C) (Oral)   Wt 67.7 kg (149 lb 3.2 oz)   SpO2 98%   BMI 23.37 kg/m     Objective:     Physical Exam    Constitutional: She is oriented to person, place, and time. She appears well-developed and well-nourished. No distress.   HENT:   Head: Normocephalic and atraumatic.   Right Ear: External ear normal.   Left Ear: External ear normal.   Nose: Nose normal.   Mouth/Throat: Oropharynx is clear and moist.   Eyes: Conjunctivae and EOM are normal. Pupils are equal, round, and reactive to light.   Neck: Normal range of motion. Neck supple.   Cardiovascular: Normal rate, regular rhythm, normal heart sounds and intact distal pulses.    Pulmonary/Chest: Effort normal and breath sounds normal.   Abdominal: Soft. Bowel sounds are normal. There is no tenderness.   Musculoskeletal: Normal range of motion.   Lymphadenopathy:     She has no cervical adenopathy.   Neurological: She is alert and oriented to person, place, and time. She has normal reflexes.   Skin: Skin is warm and dry. She is not diaphoretic.   Psychiatric: She has a normal mood and affect.   Able to focus during conversation and pay attention when spoken to directly.  No fidgeting or restlessness during exam   Nursing note and vitals reviewed.        Assessment/Plan:     1. Attention deficit hyperactivity disorder (ADHD), predominantly inattentive type    Chronic, stable   we will transition the patient back  Adderall XR 10 mg is working well for him, we will no longer use Concerta.  - amphetamine-dextroamphetamine (ADDERALL XR) 10 MG 24 hr capsule; Take 1 capsule (10 mg total) by mouth daily.  Dispense: 30 capsule; Refill: 0    2. Elevated blood pressure reading without diagnosis of hypertension    Chronic, stable   the patient's blood pressure is well controlled in the office today.  We did not need to discontinue use of stimulants since his heart rate and blood pressure are within normal limits.      Call if you develop any new or worsening symptoms  Proceed to urgent care or ED after hours if necessary    Daisey Must, D.O.      Follow up in 1-3 months for  ADHD.

## 2016-05-05 ENCOUNTER — Encounter (INDEPENDENT_AMBULATORY_CARE_PROVIDER_SITE_OTHER): Payer: Self-pay | Admitting: Family Medicine

## 2016-05-25 ENCOUNTER — Other Ambulatory Visit (INDEPENDENT_AMBULATORY_CARE_PROVIDER_SITE_OTHER): Payer: Self-pay | Admitting: Family Medicine

## 2016-05-25 DIAGNOSIS — F900 Attention-deficit hyperactivity disorder, predominantly inattentive type: Secondary | ICD-10-CM

## 2016-05-25 NOTE — Telephone Encounter (Signed)
Pt called in regards to medication refills for amphetamine-dextroamphetamine (ADDERALL XR) 10 MG 24 hr capsule. Pt requested 20 pills instead of 30. Rx can be sent to   [facility] [place], [place] - [address] [facility] [phone] (Phone)  [phone] (Fax)     Pt can be reached at [phone]

## 2016-05-26 MED ORDER — AMPHETAMINE-DEXTROAMPHET ER 10 MG PO CP24
10.0000 mg | ORAL_CAPSULE | Freq: Every day | ORAL | 0 refills | Status: DC
Start: 2016-05-26 — End: 2016-06-15

## 2016-06-09 ENCOUNTER — Telehealth (INDEPENDENT_AMBULATORY_CARE_PROVIDER_SITE_OTHER): Payer: Self-pay | Admitting: Family Medicine

## 2016-06-09 DIAGNOSIS — F900 Attention-deficit hyperactivity disorder, predominantly inattentive type: Secondary | ICD-10-CM

## 2016-06-09 NOTE — Telephone Encounter (Signed)
Patient called because he requested that his prescription for amphetamine-dextroamphetamine (ADDERALL Xr) 10 mg 24 hr capsule, be increased to 20 mg extended release, and it wasn't.  Wants to know if the Dr. Rito Ehrlich not to increase his dosage or did he not notice the request?  Patient can be reached at [phone].

## 2016-06-15 MED ORDER — AMPHETAMINE-DEXTROAMPHET ER 20 MG PO CP24
20.0000 mg | ORAL_CAPSULE | Freq: Every day | ORAL | 0 refills | Status: DC
Start: 2016-06-15 — End: 2017-06-29

## 2016-06-15 NOTE — Telephone Encounter (Signed)
Have the patient make an appointment to see me in the office after one month of being on the new dose.

## 2016-06-23 NOTE — Telephone Encounter (Signed)
Called patient to advise at [phone]. Left message.

## 2016-07-12 ENCOUNTER — Telehealth (INDEPENDENT_AMBULATORY_CARE_PROVIDER_SITE_OTHER): Payer: Self-pay | Admitting: Family Medicine

## 2016-07-12 NOTE — Telephone Encounter (Signed)
Pt picked up RX for Adderall in office today.

## 2017-05-27 ENCOUNTER — Encounter (INDEPENDENT_AMBULATORY_CARE_PROVIDER_SITE_OTHER): Payer: Self-pay

## 2017-05-27 ENCOUNTER — Ambulatory Visit (INDEPENDENT_AMBULATORY_CARE_PROVIDER_SITE_OTHER): Payer: No Typology Code available for payment source

## 2017-05-27 VITALS — BP 134/83 | HR 67 | Temp 97.9°F | Ht 67.25 in | Wt 153.6 lb

## 2017-05-27 DIAGNOSIS — Z23 Encounter for immunization: Secondary | ICD-10-CM

## 2017-05-27 DIAGNOSIS — Z0000 Encounter for general adult medical examination without abnormal findings: Secondary | ICD-10-CM

## 2017-05-27 DIAGNOSIS — G43909 Migraine, unspecified, not intractable, without status migrainosus: Secondary | ICD-10-CM

## 2017-05-27 LAB — CBC AND DIFFERENTIAL
Absolute NRBC: 0 10*3/uL
Basophils Absolute Automated: 0.07 10*3/uL (ref 0.00–0.20)
Basophils Automated: 1.6 %
Eosinophils Absolute Automated: 0.08 10*3/uL (ref 0.00–0.70)
Eosinophils Automated: 1.9 %
Hematocrit: 48.8 % (ref 42.0–52.0)
Hgb: 16.7 g/dL (ref 13.0–17.0)
Immature Granulocytes Absolute: 0.01 10*3/uL
Immature Granulocytes: 0.2 %
Lymphocytes Absolute Automated: 1.65 10*3/uL (ref 0.50–4.40)
Lymphocytes Automated: 38.6 %
MCH: 31.5 pg (ref 28.0–32.0)
MCHC: 34.2 g/dL (ref 32.0–36.0)
MCV: 91.9 fL (ref 80.0–100.0)
MPV: 11.5 fL (ref 9.4–12.3)
Monocytes Absolute Automated: 0.43 10*3/uL (ref 0.00–1.20)
Monocytes: 10.1 %
Neutrophils Absolute: 2.03 10*3/uL (ref 1.80–8.10)
Neutrophils: 47.6 %
Nucleated RBC: 0 /100 WBC (ref 0.0–1.0)
Platelets: 209 10*3/uL (ref 140–400)
RBC: 5.31 10*6/uL (ref 4.70–6.00)
RDW: 11 % — ABNORMAL LOW (ref 12–15)
WBC: 4.27 10*3/uL (ref 3.50–10.80)

## 2017-05-27 LAB — COMPREHENSIVE METABOLIC PANEL
ALT: 18 U/L (ref 0–55)
AST (SGOT): 18 U/L (ref 5–34)
Albumin/Globulin Ratio: 1.5 (ref 0.9–2.2)
Albumin: 4.5 g/dL (ref 3.5–5.0)
Alkaline Phosphatase: 80 U/L (ref 38–106)
BUN: 13 mg/dL (ref 9.0–28.0)
Bilirubin, Total: 0.9 mg/dL (ref 0.1–1.2)
CO2: 27 mEq/L (ref 21–29)
Calcium: 10.3 mg/dL (ref 8.5–10.5)
Chloride: 105 mEq/L (ref 100–111)
Creatinine: 0.9 mg/dL (ref 0.5–1.5)
Globulin: 3 g/dL (ref 2.0–3.7)
Glucose: 102 mg/dL — ABNORMAL HIGH (ref 70–100)
Potassium: 4.5 mEq/L (ref 3.5–5.1)
Protein, Total: 7.5 g/dL (ref 6.0–8.3)
Sodium: 141 mEq/L (ref 136–145)

## 2017-05-27 LAB — HEMOLYSIS INDEX: Hemolysis Index: 10 (ref 0–18)

## 2017-05-27 LAB — LIPID PANEL
Cholesterol / HDL Ratio: 2.9
Cholesterol: 192 mg/dL (ref 0–199)
HDL: 67 mg/dL (ref 40–9999)
LDL Calculated: 112 mg/dL — ABNORMAL HIGH (ref 0–99)
Triglycerides: 63 mg/dL (ref 34–149)
VLDL Calculated: 13 mg/dL (ref 10–40)

## 2017-05-27 LAB — GFR: EGFR: >60

## 2017-05-27 LAB — PSA: Prostate Specific Antigen, Total: 0.275 ng/mL (ref 0.000–4.000)

## 2017-05-27 LAB — TSH: TSH: 1.17 u[IU]/mL (ref 0.35–4.94)

## 2017-05-27 MED ORDER — SUMATRIPTAN SUCCINATE 100 MG PO TABS
100.00 mg | ORAL_TABLET | ORAL | 5 refills | Status: DC | PRN
Start: 2017-05-27 — End: 2018-02-06

## 2017-05-27 NOTE — Progress Notes (Signed)
Patient presented to the office for flu vaccine administration.    Vaccine was given to right deltoid.    No reaction was noted and patient left in good condition.

## 2017-05-27 NOTE — Progress Notes (Signed)
Subjective:      Patient ID: Christopher Trevino is a 41 y.o. male.    Chief Complaint:  Chief Complaint   Patient presents with   . Annual Exam     fasting for blood work       HPI:  HPI  Physical for screening.  Lab is due. He enjoys good health most of the time. He  Gets migraines that occur about once every week and are usually relatedto being dehydrated. He does not smoke or use alcohol.     Problem List:  Patient Active Problem List   Diagnosis   . Attention deficit hyperactivity disorder (ADHD), predominantly inattentive type   . Elevated blood pressure reading without diagnosis of hypertension       Current Medications:  Current Outpatient Prescriptions   Medication Sig Dispense Refill   . amphetamine-dextroamphetamine (ADDERALL XR) 20 MG 24 hr capsule Take 1 capsule (20 mg total) by mouth daily. 30 capsule 0   . SUMAtriptan (IMITREX) 100 MG tablet Take 1 tablet (100 mg total) by mouth every 2 (two) hours as needed for Migraine.for up to 9 doses May repeat one time in 24 hours. 9 tablet 5     No current facility-administered medications for this visit.        Allergies:  No Known Allergies    Past Medical History:  Past Medical History:   Diagnosis Date   . ADHD (attention deficit hyperactivity disorder)    . Depression screening negative 02-04-12    Neg       Past Surgical History:  History reviewed. No pertinent surgical history.    Family History:  Family History   Problem Relation Age of Onset   . Hyperlipidemia Mother    . Hypertension Mother    . Hyperlipidemia Father    . Hypertension Father        Social History:  Social History     Social History   . Marital status: Married     Spouse name: N/A   . Number of children: N/A   . Years of education: N/A     Occupational History   . Not on file.     Social History Main Topics   . Smoking status: Former Smoker     Packs/day: 0.50     Years: 3.00     Quit date: 05/10/1999   . Smokeless tobacco: Never Used   . Alcohol use 3.6 oz/week     6 Cans of beer per week    . Drug use: No   . Sexual activity: Not on file     Other Topics Concern   . Not on file     Social History Narrative   . No narrative on file       The following sections were reviewed this encounter by the provider:   Allergies  Meds  Problems         ROS:  Review of Systems   Constitutional: Negative for activity change, appetite change, fatigue, fever and unexpected weight change.   HENT: Negative.    Eyes: Negative.    Respiratory: Negative.  Negative for chest tightness.    Cardiovascular: Negative for chest pain, palpitations and leg swelling.   Gastrointestinal: Negative.    Genitourinary: Negative.    Neurological: Negative.  Negative for dizziness, speech difficulty, weakness and headaches.   Hematological: Negative for adenopathy.   Psychiatric/Behavioral: Negative.  Negative for agitation and behavioral problems.  Vitals:  BP 134/83 (BP Site: Left arm)   Pulse 67   Temp 97.9 F (36.6 C) (Oral)   Ht 1.708 m (5' 7.25")   Wt 69.7 kg (153 lb 9.6 oz)   BMI 23.88 kg/m      Objective:     Physical Exam:  Physical Exam   Constitutional: He is oriented to person, place, and time. He appears well-developed and well-nourished. No distress.   HENT:   Head: Normocephalic and atraumatic.   Right Ear: External ear normal.   Left Ear: External ear normal.   Nose: Nose normal.   Mouth/Throat: Oropharynx is clear and moist.   Eyes: Pupils are equal, round, and reactive to light. Conjunctivae and EOM are normal. Right eye exhibits no discharge. Left eye exhibits no discharge.   Neck: Normal range of motion. No tracheal deviation present. No thyromegaly present.   Cardiovascular: Normal rate, regular rhythm and normal heart sounds.    No murmur heard.  Pulmonary/Chest: Effort normal and breath sounds normal. No respiratory distress. He has no wheezes. He has no rales. He exhibits no tenderness.   Abdominal: Soft. He exhibits no distension. There is no tenderness. There is no rebound and no guarding.    Musculoskeletal: Normal range of motion.   Lymphadenopathy:     He has no cervical adenopathy.   Neurological: He is alert and oriented to person, place, and time. He displays normal reflexes. No cranial nerve deficit. Coordination normal.   Skin: Skin is warm and dry. Capillary refill takes less than 2 seconds. He is not diaphoretic. No erythema.   Psychiatric: He has a normal mood and affect. His behavior is normal.   Nursing note and vitals reviewed.       Assessment:     1. Routine general medical examination at a health care facility  - PSA  - Lipid panel  - Comprehensive metabolic panel  - CBC and differential  - TSH  - Urinalysis; Future    2. Need for influenza vaccination  - Flu vaccine QUAD PRES FREE 19YRS & GREATER    3. Migraine without status migrainosus, not intractable, unspecified migraine type  - SUMAtriptan (IMITREX) 100 MG tablet; Take 1 tablet (100 mg total) by mouth every 2 (two) hours as needed for Migraine.for up to 9 doses May repeat one time in 24 hours.  Dispense: 9 tablet; Refill: 5      Plan:     Normal physical  Lab due.  Migraines. Discussed treatment options. There is a definite link between diet and activity so he will adjust.  Additionally will trial Maxalt.     Terrilyn Saver., MD

## 2017-05-27 NOTE — Progress Notes (Signed)
Have you seen any specialists/other providers since your last visit with us?    No    Arm preference verified?   Yes    The patient is due for depression screening and influenza vaccine

## 2017-06-28 ENCOUNTER — Emergency Department: Payer: No Typology Code available for payment source

## 2017-06-28 ENCOUNTER — Emergency Department
Admission: EM | Admit: 2017-06-28 | Discharge: 2017-06-28 | Disposition: A | Discharge status: Home / Self Care | Payer: No Typology Code available for payment source | Attending: Emergency Medicine | Admitting: Emergency Medicine

## 2017-06-28 DIAGNOSIS — Z79899 Other long term (current) drug therapy: Secondary | ICD-10-CM | POA: Insufficient documentation

## 2017-06-28 DIAGNOSIS — J36 Peritonsillar abscess: Secondary | ICD-10-CM | POA: Insufficient documentation

## 2017-06-28 LAB — BASIC METABOLIC PANEL
Anion Gap: 11 (ref 5.0–15.0)
BUN: 8.5 mg/dL — ABNORMAL LOW (ref 9.0–28.0)
CO2: 22 mEq/L (ref 22–29)
Calcium: 9.6 mg/dL (ref 8.5–10.5)
Chloride: 108 mEq/L (ref 100–111)
Creatinine: 0.9 mg/dL (ref 0.7–1.3)
Glucose: 107 mg/dL — ABNORMAL HIGH (ref 70–100)
Potassium: 4.1 mEq/L (ref 3.5–5.1)
Sodium: 141 mEq/L (ref 136–145)

## 2017-06-28 LAB — CBC AND DIFFERENTIAL
Absolute NRBC: 0 10*3/uL
Basophils Absolute Automated: 0.03 10*3/uL (ref 0.00–0.20)
Basophils Automated: 0.3 %
Eosinophils Absolute Automated: 0.04 10*3/uL (ref 0.00–0.70)
Eosinophils Automated: 0.5 %
Hematocrit: 46.4 % (ref 42.0–52.0)
Hgb: 15.9 g/dL (ref 13.0–17.0)
Immature Granulocytes Absolute: 0.03 10*3/uL
Immature Granulocytes: 0.3 %
Lymphocytes Absolute Automated: 0.72 10*3/uL (ref 0.50–4.40)
Lymphocytes Automated: 8.2 %
MCH: 30.8 pg (ref 28.0–32.0)
MCHC: 34.3 g/dL (ref 32.0–36.0)
MCV: 89.7 fL (ref 80.0–100.0)
MPV: 11 fL (ref 9.4–12.3)
Monocytes Absolute Automated: 0.57 10*3/uL (ref 0.00–1.20)
Monocytes: 6.5 %
Neutrophils Absolute: 7.38 10*3/uL (ref 1.80–8.10)
Neutrophils: 84.2 %
Nucleated RBC: 0 /100 WBC (ref 0.0–1.0)
Platelets: 168 10*3/uL (ref 140–400)
RBC: 5.17 10*6/uL (ref 4.70–6.00)
RDW: 11 % — ABNORMAL LOW (ref 12–15)
WBC: 8.77 10*3/uL (ref 3.50–10.80)

## 2017-06-28 LAB — GFR: EGFR: >60

## 2017-06-28 MED ORDER — ONDANSETRON HCL 4 MG/2ML IJ SOLN
4.00 mg | Freq: Once | INTRAMUSCULAR | Status: AC
Start: 2017-06-28 — End: 2017-06-28
  Filled 2017-06-28: qty 2

## 2017-06-28 MED ORDER — KETOROLAC TROMETHAMINE 30 MG/ML IJ SOLN
30.00 mg | Freq: Once | INTRAMUSCULAR | Status: AC
Start: 2017-06-28 — End: 2017-06-28
  Administered 2017-06-28: 10:00:00 30 mg via INTRAVENOUS
  Filled 2017-06-28: qty 1

## 2017-06-28 MED ORDER — CLINDAMYCIN HCL 150 MG PO CAPS
300.00 mg | ORAL_CAPSULE | Freq: Four times a day (QID) | ORAL | 0 refills | Status: DC
Start: 2017-06-28 — End: 2017-07-02

## 2017-06-28 MED ORDER — HYDROCODONE-ACETAMINOPHEN 5-325 MG PO TABS
1.00 | ORAL_TABLET | Freq: Once | ORAL | Status: DC
Start: 2017-06-28 — End: 2017-06-28

## 2017-06-28 MED ORDER — ONDANSETRON HCL 4 MG/2ML IJ SOLN
INTRAMUSCULAR | Status: AC
Start: 2017-06-28 — End: 2017-06-28
  Administered 2017-06-28: 10:00:00 4 mg via INTRAVENOUS
  Filled 2017-06-28: qty 2

## 2017-06-28 MED ORDER — OXYCODONE-ACETAMINOPHEN 5-325 MG PO TABS
1.00 | ORAL_TABLET | Freq: Four times a day (QID) | ORAL | 0 refills | Status: DC | PRN
Start: 2017-06-28 — End: 2017-07-02

## 2017-06-28 MED ORDER — METHYLPREDNISOLONE 4 MG PO TBPK
ORAL_TABLET | ORAL | 0 refills | Status: DC
Start: 2017-06-28 — End: 2017-07-02

## 2017-06-28 MED ORDER — IOHEXOL 350 MG/ML IV SOLN
100.00 mL | Freq: Once | INTRAVENOUS | Status: AC | PRN
Start: 2017-06-28 — End: 2017-06-28
  Administered 2017-06-28: 11:00:00 100 mL via INTRAVENOUS

## 2017-06-28 MED ORDER — SODIUM CHLORIDE 0.9 % IV BOLUS
1000.00 mL | Freq: Once | INTRAVENOUS | Status: AC
Start: 2017-06-28 — End: 2017-06-28
  Administered 2017-06-28: 10:00:00 1000 mL via INTRAVENOUS

## 2017-06-28 MED ORDER — DEXAMETHASONE SOD PHOSPHATE PF 10 MG/ML IJ SOLN
10.00 mg | Freq: Once | INTRAMUSCULAR | Status: AC
Start: 2017-06-28 — End: 2017-06-28
  Administered 2017-06-28: 10:00:00 10 mg via INTRAVENOUS
  Filled 2017-06-28: qty 1

## 2017-06-28 MED ORDER — MORPHINE SULFATE 4 MG/ML IJ/IV SOLN (WRAP)
4.0000 mg | Freq: Once | Status: AC
Start: 2017-06-28 — End: 2017-06-28
  Administered 2017-06-28: 10:00:00 4 mg via INTRAVENOUS
  Filled 2017-06-28: qty 1

## 2017-06-28 MED ORDER — METHYLPREDNISOLONE 4 MG PO TBPK
ORAL_TABLET | ORAL | 0 refills | Status: DC
Start: 2017-06-28 — End: 2017-06-29

## 2017-06-28 MED ORDER — CLINDAMYCIN PHOSPHATE IN D5W 900 MG/50ML IV SOLN
900.00 mg | Freq: Once | INTRAVENOUS | Status: AC
Start: 2017-06-28 — End: 2017-06-28
  Administered 2017-06-28: 14:00:00 900 mg via INTRAVENOUS
  Filled 2017-06-28: qty 50

## 2017-06-28 MED ORDER — OXYCODONE-ACETAMINOPHEN 5-325 MG PO TABS
1.00 | ORAL_TABLET | Freq: Once | ORAL | Status: AC
Start: 2017-06-28 — End: 2017-06-28
  Administered 2017-06-28: 13:00:00 1 via ORAL
  Filled 2017-06-28: qty 1

## 2017-06-28 NOTE — ED Triage Notes (Signed)
Pt sts dx with left ear infection on Sunday, on ABT therapy. Pt c/o left ear pain and sore throat worse today.

## 2017-06-28 NOTE — Discharge Instructions (Signed)
Peritonsillar Abscess    You have been diagnosed with a peritonsillar abscess.    An abscess is a collection of pus that can form a large, hard mass. A peritonsillar abscess is located next to the tonsils. An abscess can become very dangerous. If it gets too large, it can block off your windpipe. This can cause serious problems with breathing.    Your abscess was not drained, but there is a chance it will grow in size and need to be drained.    Your doctor has prescribed antibiotics. It is VERY important that you take these as directed until they are gone.    The doctor who took care of you today feels it is OK for you to go home. You must be seen again to be sure the infection is getting better. If you can't see your regular doctor or the referral doctor, come back here for a recheck.    YOU SHOULD SEEK MEDICAL ATTENTION IMMEDIATELY, EITHER HERE OR AT THE NEAREST EMERGENCY DEPARTMENT, IF ANY OF THE FOLLOWING OCCURS:   You have fever (temperature higher than 100.4F / 38C) that doesn t go away, are having shaking chills, or are vomiting.   You cannot swallow liquids.   You have trouble breathing.   The pain gets worse.   You are unable to talk.

## 2017-06-28 NOTE — ED Provider Notes (Signed)
EMERGENCY DEPARTMENT NOTE    Physician/Midlevel provider first contact with patient: 06/28/17 0858         HISTORY OF PRESENT ILLNESS   Historian:Patient  Translator Used: No    Chief Complaint: Sore Throat and Otalgia     Mechanism of Injury:       42 y.o. male with no pmh presents with worsening  L sided throat pain and L ear ache for the past 5 days. When symptoms started on Saturday, he went to an urgent care and was prescribed Augmentin for tonsilitis. HE has been taking tylenol and advil without any improvement, in fact it feels worse. Can't sleep due to pain. Can't eat much due to pain. Tolerating saliva and secretions. No fever, chills.       1. What was patient doing when symptoms started (Context): see above  2. Radiation of symptoms: Yes  3. Associated signs and Symptoms: see above  4. Are symptoms worsening? Yes    MEDICAL HISTORY     Past Medical History:  Past Medical History:   Diagnosis Date   . Depression screening negative 02-04-12    Neg   . History of migraine        Past Surgical History:  History reviewed. No pertinent surgical history.    Social History:  Social History     Social History   . Marital status: Married     Spouse name: N/A   . Number of children: N/A   . Years of education: N/A     Occupational History   . Not on file.     Social History Main Topics   . Smoking status: Former Smoker     Packs/day: 0.50     Years: 3.00     Quit date: 05/10/1999   . Smokeless tobacco: Never Used   . Alcohol use 3.6 oz/week     6 Cans of beer per week   . Drug use: No   . Sexual activity: Not on file     Other Topics Concern   . Not on file     Social History Narrative   . No narrative on file       Family History:  Family History   Problem Relation Age of Onset   . Hyperlipidemia Mother    . Hypertension Mother    . Hyperlipidemia Father    . Hypertension Father        Outpatient Medication:  Discharge Medication List as of 06/28/2017  1:29 PM      CONTINUE these medications which have NOT CHANGED     Details   SUMAtriptan (IMITREX) 100 MG tablet Take 1 tablet (100 mg total) by mouth every 2 (two) hours as needed for Migraine.for up to 9 doses May repeat one time in 24 hours., Starting Fri 05/27/2017, Print      amphetamine-dextroamphetamine (ADDERALL XR) 20 MG 24 hr capsule Take 1 capsule (20 mg total) by mouth daily., Starting Tue 06/15/2016, Print               REVIEW OF SYSTEMS   Review of Systems   Constitutional: Positive for chills and fever.   HENT: Positive for ear discharge, ear pain and sore throat.    Respiratory: Positive for cough.    Cardiovascular: Positive for chest pain.   Gastrointestinal: Positive for abdominal pain, nausea and vomiting.        All other systems reviewed and negative.      PHYSICAL EXAM  ED Triage Vitals [06/28/17 0836]   Enc Vitals Group      BP (!) 139/93      Heart Rate 82      Resp Rate 16      Temp 97.6 F (36.4 C)      Temp Source Temporal Art      SpO2 97 %      Weight 68.5 kg      Height 1.702 m      Head Circumference       Peak Flow       Pain Score 10      Pain Loc       Pain Edu?       Excl. in GC?          Nursing note and vitals reviewed.   Constitutional:  Well developed, well nourished.  Awake & alert.  No acute distress.  Eyes:  PERRL.  Conjunctivae are not pale.  Ears: No right TM erythema. No left TM erythema. No dullness, bulging, or loss of landmarks.   Nose: No nasal congestion noted.  Throat: L tonsillar erythema, edema, no exudate. Uvula appears midline without shift. NO trismus noted.   Neck:  Supple.  Full ROM.  No JVD.  + L cervical lymphadenopathy. No nuchal rigidity or meningismus.  Cardiovascular:  Regular rate.  Regular rhythm.  No murmurs, rubs, or gallops.  Distal pulses are 2+ and symmetric.  Pulmonary/Chest:  No evidence of respiratory distress.  Clear to auscultation bilaterally.  No wheezing, rales or rhonchi. Chest non-tender to palpation.   Abdominal:  Soft and non-distended.  There is no tenderness.  No rebound, guarding, or  rigidity. No hepatomegaly. No splenomegaly.   Extremities:  No peripheral edema.   No cyanosis.  No clubbing.  Full range of motion in all extremities.  No calf tenderness.  Skin:  Skin is warm and dry.  No petechiae.  No purpura.    Neurological:  Alert, awake, and appropriate.  Normal speech.    Psychiatric:  Good eye contact.  Normal interaction, affect, and behavior.         MEDICAL DECISION MAKING     LABORATORY RESULTS: Ordered and independently interpreted available laboratory tests. Please see results section in chart for full details.  Labs Reviewed   CBC AND DIFFERENTIAL - Abnormal; Notable for the following:        Result Value    RDW 11 (*)     All other components within normal limits   BASIC METABOLIC PANEL - Abnormal; Notable for the following:     Glucose 107 (*)     BUN 8.5 (*)     All other components within normal limits   GFR       IMAGING STUDIES:  Imaging studies were ordered and results reviewed by me.      CT Soft Tissue Neck with Contrast   Final Result       1. Findings consistent with acute tonsillitis, with asymmetric   enlargement and heterogeneous enhancement of the left palatine tonsil.   There is a tiny collection involving the peritonsillar soft tissues   inferiorly on the left which measures up to 1 cm, suspicious for a tiny   abscess in this setting.   2. There is asymmetric soft tissue fullness involving the nasopharynx on   the left which likely reflects associated inflammatory change. No   discrete mass lesion is identified. Clinical correlation is recommended.   3. Asymmetrically enlarged  cervical lymph nodes on the left are likely   reactive.       Nicoletta Dress, MD    06/28/2017 11:46 AM          MEDICAL DECISION MAKING:    Oxygen Saturation by Pulse Oximetry: 99%  Interventions: None Needed.  Interpretation: Normal    Vital Signs: Reviewed the patient?s vital signs.     Nursing Notes: Reviewed and utilized available nursing notes.    Medical Records Reviewed: Reviewed  available past medical records.            DDX: tonsilitis, peritonsilar abscess, tonsillar abscess, mass  Plan: labs, pain med, decadron, ct neck    PT feeling better. Workup reveal 1cm L peritonsillar abscess. Will consult ENT.    Consultations:  . Discussed patient case with Dr. Skip Mayer, states it is too small to drain, recommend clindamycin and medrol dose pack. Will see in Office on Friday.     Updated pt on plan of care, he agrees with plan to f/u with ENT. RTER precautions given.       Counseling: The emergency provider has spoken with the patient and discussed today?s findings, in addition to providing specific details for the plan of care.  Questions are answered and there is agreement with the plan.            EMERGENCY DEPT. MEDICATIONS      ED Medication Orders     Start Ordered     Status Ordering Provider    06/28/17 1300 06/28/17 1200  clindamycin (CLEOCIN) 900mg  in D5W 50mL IVPB (premix)  Once     Route: Intravenous  Ordered Dose: 900 mg     Last MAR action:  Stopped Nell Schrack MEI    06/28/17 1235 06/28/17 1234    Once     Route: Oral  Ordered Dose: 1 tablet     Discontinued Marnell Mcdaniel MEI    06/28/17 1235 06/28/17 1234  oxyCODONE-acetaminophen (PERCOCET) 5-325 MG per tablet 1 tablet  Once     Route: Oral  Ordered Dose: 1 tablet     Last MAR action:  Given Danaisha Celli MEI    06/28/17 0940 06/28/17 0939  sodium chloride 0.9 % bolus 1,000 mL  Once     Route: Intravenous  Ordered Dose: 1,000 mL     Last MAR action:  Stopped Chynah Orihuela MEI    06/28/17 0940 06/28/17 0939  ondansetron (ZOFRAN) injection 4 mg  Once     Route: Intravenous  Ordered Dose: 4 mg     Last MAR action:  Given Jahrell Hamor MEI    06/28/17 0940 06/28/17 0939  ketorolac (TORADOL) injection 30 mg  Once     Route: Intravenous  Ordered Dose: 30 mg     Last MAR action:  Given Dallin Mccorkel MEI    06/28/17 0940 06/28/17 0939  dexamethasone (DECADRON) 10 mg/mL injection 10 mg  Once     Route: Intravenous  Ordered Dose: 10 mg     Last MAR action:   Given Jamylah Marinaccio MEI    06/28/17 0940 06/28/17 0939  morphine injection 4 mg  Once     Route: Intravenous  Ordered Dose: 4 mg     Last MAR action:  Given Werner Labella MEI            DIAGNOSIS      Diagnosis:  Final diagnoses:   Peritonsillar abscess       Disposition:  ED Disposition  ED Disposition Condition Date/Time Comment    Discharge  Tue Jun 28, 2017  1:29 PM Jacklynn Lewis discharge to home/self care.    Condition at disposition: Stable          Prescriptions:  Discharge Medication List as of 06/28/2017  1:29 PM      START taking these medications    Details   clindamycin (CLEOCIN) 150 MG capsule Take 2 capsules (300 mg total) by mouth 4 (four) times daily.for 7 days, Starting Tue 06/28/2017, Until Tue 07/05/2017, Print      methylPREDNISolone (MEDROL DOSPACK) 4 MG tablet Use as directed, Normal      oxyCODONE-acetaminophen (PERCOCET) 5-325 MG per tablet Take 1 tablet by mouth every 6 (six) hours as needed for Pain., Starting Tue 06/28/2017, Print         CONTINUE these medications which have NOT CHANGED    Details   SUMAtriptan (IMITREX) 100 MG tablet Take 1 tablet (100 mg total) by mouth every 2 (two) hours as needed for Migraine.for up to 9 doses May repeat one time in 24 hours., Starting Fri 05/27/2017, Print      amphetamine-dextroamphetamine (ADDERALL XR) 20 MG 24 hr capsule Take 1 capsule (20 mg total) by mouth daily., Starting Tue 06/15/2016, Print         STOP taking these medications       amoxicillin-clavulanate (AUGMENTIN) 875-125 MG per tablet Comments:   Reason for Stopping:                   Procedures       Ollen Barges, MD  07/01/17 [phone]

## 2017-06-29 ENCOUNTER — Telehealth (INDEPENDENT_AMBULATORY_CARE_PROVIDER_SITE_OTHER): Payer: Self-pay

## 2017-06-29 ENCOUNTER — Inpatient Hospital Stay
Admission: EM | Admit: 2017-06-29 | Discharge: 2017-07-02 | DRG: 137 | Disposition: A | Discharge status: Home / Self Care | Payer: No Typology Code available for payment source | Attending: Internal Medicine | Admitting: Internal Medicine

## 2017-06-29 DIAGNOSIS — Z8669 Personal history of other diseases of the nervous system and sense organs: Secondary | ICD-10-CM

## 2017-06-29 DIAGNOSIS — Z7289 Other problems related to lifestyle: Secondary | ICD-10-CM

## 2017-06-29 DIAGNOSIS — J36 Peritonsillar abscess: Secondary | ICD-10-CM | POA: Diagnosis present

## 2017-06-29 DIAGNOSIS — Z7952 Long term (current) use of systemic steroids: Secondary | ICD-10-CM

## 2017-06-29 DIAGNOSIS — Z79891 Long term (current) use of opiate analgesic: Secondary | ICD-10-CM

## 2017-06-29 DIAGNOSIS — Z789 Other specified health status: Secondary | ICD-10-CM

## 2017-06-29 DIAGNOSIS — R030 Elevated blood-pressure reading, without diagnosis of hypertension: Secondary | ICD-10-CM | POA: Diagnosis present

## 2017-06-29 DIAGNOSIS — J391 Other abscess of pharynx: Principal | ICD-10-CM | POA: Diagnosis present

## 2017-06-29 DIAGNOSIS — Z87891 Personal history of nicotine dependence: Secondary | ICD-10-CM

## 2017-06-29 DIAGNOSIS — H6692 Otitis media, unspecified, left ear: Secondary | ICD-10-CM | POA: Diagnosis present

## 2017-06-29 DIAGNOSIS — J0510 Acute epiglottitis without obstruction: Secondary | ICD-10-CM | POA: Diagnosis present

## 2017-06-29 DIAGNOSIS — Z79899 Other long term (current) drug therapy: Secondary | ICD-10-CM

## 2017-06-29 DIAGNOSIS — R739 Hyperglycemia, unspecified: Secondary | ICD-10-CM | POA: Diagnosis present

## 2017-06-29 HISTORY — DX: Personal history of other diseases of the nervous system and sense organs: Z86.69

## 2017-06-29 LAB — COMPREHENSIVE METABOLIC PANEL
ALT: 14 U/L (ref 0–55)
AST (SGOT): 13 U/L (ref 5–34)
Albumin/Globulin Ratio: 1.5 (ref 0.9–2.2)
Albumin: 4.4 g/dL (ref 3.5–5.0)
Alkaline Phosphatase: 76 U/L (ref 38–106)
Anion Gap: 10 (ref 5.0–15.0)
BUN: 8.4 mg/dL — ABNORMAL LOW (ref 9.0–28.0)
Bilirubin, Total: 0.6 mg/dL (ref 0.2–1.2)
CO2: 26 mEq/L (ref 22–29)
Calcium: 9.8 mg/dL (ref 8.5–10.5)
Chloride: 105 mEq/L (ref 100–111)
Creatinine: 0.9 mg/dL (ref 0.7–1.3)
Globulin: 2.9 g/dL (ref 2.0–3.6)
Glucose: 112 mg/dL — ABNORMAL HIGH (ref 70–100)
Potassium: 4.2 mEq/L (ref 3.5–5.1)
Protein, Total: 7.3 g/dL (ref 6.0–8.3)
Sodium: 141 mEq/L (ref 136–145)

## 2017-06-29 LAB — CBC AND DIFFERENTIAL
Absolute NRBC: 0 10*3/uL
Basophils Absolute Automated: 0.04 10*3/uL (ref 0.00–0.20)
Basophils Automated: 0.5 %
Eosinophils Absolute Automated: 0.01 10*3/uL (ref 0.00–0.70)
Eosinophils Automated: 0.1 %
Hematocrit: 42.9 % (ref 42.0–52.0)
Hgb: 15 g/dL (ref 13.0–17.0)
Immature Granulocytes Absolute: 0.03 10*3/uL
Immature Granulocytes: 0.4 %
Lymphocytes Absolute Automated: 0.81 10*3/uL (ref 0.50–4.40)
Lymphocytes Automated: 9.8 %
MCH: 31.6 pg (ref 28.0–32.0)
MCHC: 35 g/dL (ref 32.0–36.0)
MCV: 90.3 fL (ref 80.0–100.0)
MPV: 11.2 fL (ref 9.4–12.3)
Monocytes Absolute Automated: 0.72 10*3/uL (ref 0.00–1.20)
Monocytes: 8.7 %
Neutrophils Absolute: 6.62 10*3/uL (ref 1.80–8.10)
Neutrophils: 80.5 %
Nucleated RBC: 0 /100 WBC (ref 0.0–1.0)
Platelets: 198 10*3/uL (ref 140–400)
RBC: 4.75 10*6/uL (ref 4.70–6.00)
RDW: 11 % — ABNORMAL LOW (ref 12–15)
WBC: 8.23 10*3/uL (ref 3.50–10.80)

## 2017-06-29 LAB — GFR: EGFR: >60

## 2017-06-29 MED ORDER — RISAQUAD PO CAPS
1.00 | ORAL_CAPSULE | Freq: Every day | ORAL | Status: DC
Start: 2017-06-29 — End: 2017-06-29
  Administered 2017-06-29: 18:00:00 1 via ORAL
  Filled 2017-06-29: qty 1

## 2017-06-29 MED ORDER — SUMATRIPTAN SUCCINATE 50 MG PO TABS
50.00 mg | ORAL_TABLET | ORAL | Status: DC | PRN
Start: 2017-06-29 — End: 2017-07-02
  Filled 2017-06-29: qty 1

## 2017-06-29 MED ORDER — CLINDAMYCIN PHOSPHATE IN D5W 600 MG/50ML IV SOLN
600.00 mg | Freq: Three times a day (TID) | INTRAVENOUS | Status: DC
Start: 2017-06-29 — End: 2017-06-29

## 2017-06-29 MED ORDER — CALCIUM CARBONATE ANTACID 500 MG PO CHEW
1000.00 mg | CHEWABLE_TABLET | Freq: Three times a day (TID) | ORAL | Status: DC | PRN
Start: 2017-06-29 — End: 2017-07-02
  Filled 2017-06-29: qty 2

## 2017-06-29 MED ORDER — MORPHINE SULFATE 2 MG/ML IJ/IV SOLN (WRAP)
2.0000 mg | Status: DC | PRN
Start: 2017-06-29 — End: 2017-06-30
  Administered 2017-06-29 – 2017-06-30 (×2): 1 mg via INTRAVENOUS
  Administered 2017-06-30: 2 mg via INTRAVENOUS
  Filled 2017-06-29 (×3): qty 1

## 2017-06-29 MED ORDER — DOCUSATE SODIUM 100 MG PO CAPS
100.00 mg | ORAL_CAPSULE | Freq: Two times a day (BID) | ORAL | Status: DC
Start: 2017-06-29 — End: 2017-07-02
  Administered 2017-06-29 – 2017-07-02 (×5): 100 mg via ORAL
  Filled 2017-06-29 (×5): qty 1

## 2017-06-29 MED ORDER — ACETAMINOPHEN 325 MG PO TABS
650.00 mg | ORAL_TABLET | ORAL | Status: DC | PRN
Start: 2017-06-29 — End: 2017-07-02
  Administered 2017-06-29 – 2017-07-01 (×3): 650 mg via ORAL
  Filled 2017-06-29 (×3): qty 2

## 2017-06-29 MED ORDER — KETOROLAC TROMETHAMINE 30 MG/ML IJ SOLN
30.00 mg | Freq: Once | INTRAMUSCULAR | Status: AC
Start: 2017-06-29 — End: 2017-06-29
  Administered 2017-06-29: 17:00:00 30 mg via INTRAVENOUS
  Filled 2017-06-29: qty 1

## 2017-06-29 MED ORDER — ZOLPIDEM TARTRATE 5 MG PO TABS
10.00 mg | ORAL_TABLET | Freq: Every evening | ORAL | Status: DC | PRN
Start: 2017-06-29 — End: 2017-07-02

## 2017-06-29 MED ORDER — OXYCODONE HCL 5 MG PO TABS
10.00 mg | ORAL_TABLET | ORAL | Status: DC | PRN
Start: 2017-06-29 — End: 2017-06-29

## 2017-06-29 MED ORDER — RISAQUAD PO CAPS
1.00 | ORAL_CAPSULE | Freq: Every day | ORAL | Status: DC
Start: 2017-06-30 — End: 2017-07-02
  Administered 2017-07-01 – 2017-07-02 (×2): 1 via ORAL
  Filled 2017-06-29 (×2): qty 1

## 2017-06-29 MED ORDER — ALPRAZOLAM 0.5 MG PO TABS
0.50 mg | ORAL_TABLET | ORAL | Status: DC | PRN
Start: 2017-06-29 — End: 2017-07-02

## 2017-06-29 MED ORDER — SODIUM CHLORIDE 0.9 % IV BOLUS
1000.00 mL | Freq: Once | INTRAVENOUS | Status: AC
Start: 2017-06-29 — End: 2017-06-29
  Administered 2017-06-29: 17:00:00 1000 mL via INTRAVENOUS

## 2017-06-29 MED ORDER — DEXAMETHASONE SOD PHOSPHATE PF 10 MG/ML IJ SOLN
10.00 mg | Freq: Once | INTRAMUSCULAR | Status: AC
Start: 2017-06-29 — End: 2017-06-29
  Administered 2017-06-29: 18:00:00 10 mg via INTRAVENOUS
  Filled 2017-06-29: qty 1

## 2017-06-29 MED ORDER — CLINDAMYCIN PHOSPHATE IN D5W 600 MG/50ML IV SOLN
600.00 mg | Freq: Three times a day (TID) | INTRAVENOUS | Status: DC
Start: 2017-06-30 — End: 2017-06-30
  Administered 2017-06-30 (×2): 600 mg via INTRAVENOUS
  Filled 2017-06-29 (×3): qty 50

## 2017-06-29 MED ORDER — ALUM & MAG HYDROXIDE-SIMETH 200-200-20 MG/5ML PO SUSP
15.00 mL | ORAL | Status: DC | PRN
Start: 2017-06-29 — End: 2017-07-02

## 2017-06-29 MED ORDER — AMPICILLIN-SULBACTAM SODIUM 3 (2-1) G IJ SOLR
3.00 g | Freq: Four times a day (QID) | INTRAMUSCULAR | Status: DC
Start: 2017-06-29 — End: 2017-06-29
  Filled 2017-06-29: qty 20

## 2017-06-29 MED ORDER — ONDANSETRON HCL 4 MG/2ML IJ SOLN
4.00 mg | INTRAMUSCULAR | Status: DC | PRN
Start: 2017-06-29 — End: 2017-07-02

## 2017-06-29 MED ORDER — SODIUM CHLORIDE 0.9 % IV SOLN
INTRAVENOUS | Status: DC
Start: 2017-06-29 — End: 2017-07-02

## 2017-06-29 MED ORDER — MORPHINE SULFATE 2 MG/ML IJ/IV SOLN (WRAP)
2.0000 mg | Freq: Once | Status: AC
Start: 2017-06-29 — End: 2017-06-29
  Administered 2017-06-29: 18:00:00 2 mg via INTRAVENOUS
  Filled 2017-06-29: qty 1

## 2017-06-29 MED ORDER — CLINDAMYCIN PHOSPHATE IN D5W 600 MG/50ML IV SOLN
600.00 mg | Freq: Once | INTRAVENOUS | Status: AC
Start: 2017-06-29 — End: 2017-06-29
  Administered 2017-06-29: 600 mg via INTRAVENOUS
  Filled 2017-06-29: qty 50

## 2017-06-29 MED ORDER — PROMETHAZINE HCL 25 MG/ML IJ SOLN
12.50 mg | INTRAMUSCULAR | Status: DC | PRN
Start: 2017-06-29 — End: 2017-07-02

## 2017-06-29 MED ORDER — FAMOTIDINE 20 MG PO TABS
20.00 mg | ORAL_TABLET | Freq: Two times a day (BID) | ORAL | Status: DC
Start: 2017-06-29 — End: 2017-07-02
  Administered 2017-06-29 – 2017-07-02 (×6): 20 mg via ORAL
  Filled 2017-06-29 (×6): qty 1

## 2017-06-29 MED ORDER — ALBUTEROL-IPRATROPIUM 2.5-0.5 (3) MG/3ML IN SOLN
3.00 mL | RESPIRATORY_TRACT | Status: DC | PRN
Start: 2017-06-29 — End: 2017-07-02

## 2017-06-29 MED ORDER — DEXAMETHASONE SOD PHOSPHATE PF 10 MG/ML IJ SOLN
10.00 mg | Freq: Every day | INTRAMUSCULAR | Status: DC
Start: 2017-06-30 — End: 2017-07-01
  Administered 2017-06-30: 09:00:00 10 mg via INTRAVENOUS
  Filled 2017-06-29 (×2): qty 1

## 2017-06-29 MED ORDER — ONDANSETRON HCL 4 MG/2ML IJ SOLN
4.00 mg | Freq: Once | INTRAMUSCULAR | Status: AC
Start: 2017-06-29 — End: 2017-06-29
  Administered 2017-06-29: 18:00:00 4 mg via INTRAVENOUS
  Filled 2017-06-29: qty 2

## 2017-06-29 NOTE — UM Notes (Signed)
IP Review 06/29/17    42 yr old presented to ED on 06/29/17 and placed on IP status @ 1721 for difficulty swollowing         MD Summary:      Assessment:  Seen in ER yesterday for peritonsillar abscess, sent home and given rx for abx and steroids. States that today he feels worse and that the swelling in his throat has greatly increased. Has trouble swallowing, barely able to drink any fluids    VS: t 98.7, HR 75, RR 18, 146/84, pain 10/10    Labs: WNL    ED Meds:  NS bolus  toradol 30mg  IV        Inpatient status on unit    DX:  Peritonsillar abscess    Medicine H/P:  1. Trouble swallowing with increased swelling in his throat for which patient was diagnosed with tonsillitis yesterday in the ER. Ct scan showed small 1 cm tiny collection of peritonsillar soft tissue inferiorly on the left could be an abscess.  Case discussed yesterday with Dr. Skip Mayer, states it is too small to drain, recommend clindamycin and medrol dose pack and was told she would see the patient in the office, 1/4. Even after taking clindamycin and his 1st dose of Medrol Dosepak.  He feels like his swelling is increasing and unable to take by mouth. No leukocytosis on admission.  He had been on Augmentin since 06/26/17 prior to yesterday with no relief    We will start an IV clindamycin and will consult infectious disease in house.   Start on IV Decadron    Start on clear liquids and advance diet as tolerated   ENT Dr. Skip Mayer will see tomorrowso will leave nothing by mouth after midnight   IVF   Morphine as needed as patient says oxycodone did not work for him.  Outpatient   Continue to monitor complete blood count and for spiking temps    2. Hyperglycemia without diagnosis of diabetes, most likely steroid induced, as well as stress-induced secondary to number 1.  Monitor BMP at this point      Orders:  NS @ 17ml/hr  Clindamycin 600mg  IV  Decadron 10mg  IV  BMP, CBC, mag, phos draws  NPO 06/30/16 @ 0001  VS Q 4 H

## 2017-06-29 NOTE — Plan of Care (Signed)
Problem: Pain  Goal: Pain at adequate level as identified by patient  Pt reports that morphine and toradol helped his pain in ED, pt placed on pulse ox for monitoring

## 2017-06-29 NOTE — Telephone Encounter (Signed)
ED Visit Follow Up Call      [facility]     Discharge Date:  [id number]    Primary Discharge Dx:  Peritonsillar abscess    Prior ER Visits/Hospitalizations (past yr):  N/A    Follow Up Appt with PCP/Specialist :       Outside care ordered:  [facility], PT, OT)     Have you been contacted to arrange care? No    Patient Questions:    What symptoms made you go to the ED?:  Sore throat, Otalgia    How are you feeling today after your recent ED visit?:  "Terrible"     Do you have any new or worsening symptoms since being seen?  If so, describe your symptoms and what you have tried to relieve your symptoms.  Throat still hurting    What instructions did they give you at discharge?: f/u PCP    Do you have your printed copy of your discharge instructions?:  Yes     What questions do you have regarding your instructions? None      What new medications were prescribed at your visit or what changes to your medications were made?  Dlindamycin 150mg , Medrol Dospack, Percocet 5-325     What questions do you have regarding your discharge medications?  None    Do you have assistance at home and transportation to appointments?      Yes        Advised to follow up as instructed.  Patient / family member verbalizes understanding and has no other questions or concerns.   Instructed to call back if symptoms change or worsen and/or go to the emergency room or call 911 for emergency symptoms.

## 2017-06-29 NOTE — ED Notes (Signed)
Admit to [facility] for peritonsillar abscess. A&ox4. No isolation. Ambulatory.

## 2017-06-29 NOTE — ED Provider Notes (Signed)
Physician/Midlevel provider first contact with patient: 06/29/17 1611         History     Chief Complaint   Patient presents with   . Abscess   . Oral Swelling       Chief Complaint: Pain  Onset/Duration: Several days  Quality/Location: Throat  Severity: Moderate  Aggravating Factors: Swallowing  Alleviating Factors: None  Associated Symptoms/ Additional Comments: Seen@ED  yesterday for same    Several day history of continued and worsening sore throat/left facial/neck pain.  Seen at urgent care and started on Augmentin 2 days, then at ED yesterday and changed to clindamycin times one day secondary to PTA noted CT scan.          The history is provided by the patient and the spouse.            Past Medical History:   Diagnosis Date   . ADHD (attention deficit hyperactivity disorder)    . Depression screening negative 02-04-12    Neg       History reviewed. No pertinent surgical history.    Family History   Problem Relation Age of Onset   . Hyperlipidemia Mother    . Hypertension Mother    . Hyperlipidemia Father    . Hypertension Father        Social  Social History   Substance Use Topics   . Smoking status: Former Smoker     Packs/day: 0.50     Years: 3.00     Quit date: 05/10/1999   . Smokeless tobacco: Never Used   . Alcohol use 3.6 oz/week     6 Cans of beer per week       .     No Known Allergies    Home Medications     Med List Status:  In Progress Set By: Lennon Alstrom, RN at 06/29/2017  4:02 PM                amphetamine-dextroamphetamine (ADDERALL XR) 20 MG 24 hr capsule     Take 1 capsule (20 mg total) by mouth daily.     clindamycin (CLEOCIN) 150 MG capsule     Take 2 capsules (300 mg total) by mouth 4 (four) times daily.for 7 days     methylPREDNISolone (MEDROL DOSPACK) 4 MG tablet     Use as directed     methylPREDNISolone (MEDROL DOSPACK) 4 MG tablet     Use as directed     oxyCODONE-acetaminophen (PERCOCET) 5-325 MG per tablet     Take 1 tablet by mouth every 6 (six) hours as needed for Pain.      SUMAtriptan (IMITREX) 100 MG tablet     Take 1 tablet (100 mg total) by mouth every 2 (two) hours as needed for Migraine.for up to 9 doses May repeat one time in 24 hours.           Review of Systems   Constitutional: Negative for fatigue and fever.   HENT: Positive for sore throat. Negative for congestion.    Eyes: Negative for redness and visual disturbance.   Respiratory: Negative for cough and shortness of breath.    Cardiovascular: Negative for chest pain and palpitations.   Gastrointestinal: Negative for abdominal pain, diarrhea, nausea and vomiting.   Genitourinary: Negative for difficulty urinating and dysuria.   Musculoskeletal: Negative for back pain.   Skin: Negative for rash.   Neurological: Negative for dizziness, weakness and headaches.   Psychiatric/Behavioral: Negative for suicidal ideas.  The patient is not nervous/anxious.        Physical Exam    BP: 146/84, Heart Rate: 75, Temp: 98.7 F (37.1 C), Resp Rate: 18, SpO2: 96 %, Weight: 68 kg    Physical Exam   Constitutional: He appears well-developed. No distress.   Comfortable, no apparent distress, without signs of airway compromise   HENT:   Head: Normocephalic and atraumatic.   Mouth/Throat:       Eyes: Pupils are equal, round, and reactive to light. Conjunctivae and EOM are normal.   Neck: Normal range of motion. Neck supple.   Cardiovascular: Normal rate and regular rhythm.    No murmur heard.  Pulmonary/Chest: Effort normal and breath sounds normal. No stridor. No respiratory distress.   Abdominal: Soft. He exhibits no mass. There is no tenderness.   Musculoskeletal: Normal range of motion. He exhibits no edema.        Right shoulder: He exhibits no deformity.   Lymphadenopathy:     He has no cervical adenopathy.   Neurological: He is alert. He has normal strength.   Skin: Skin is warm. No rash noted.   Psychiatric: He has a normal mood and affect. His speech is normal.         MDM and ED Course     ED Medication Orders     Start Ordered      Status Ordering Provider    06/29/17 1712 06/29/17 1711  clindamycin (CLEOCIN) 600mg  in D5W 50mL IVPB (premix)  Once     Route: Intravenous  Ordered Dose: 600 mg     Ordered Charley Miske JEFFREY    06/29/17 1636 06/29/17 1635  sodium chloride 0.9 % bolus 1,000 mL  Once     Route: Intravenous  Ordered Dose: 1,000 mL     Last MAR action:  New Bag Donnavan Covault JEFFREY    06/29/17 1636 06/29/17 1635  ketorolac (TORADOL) injection 30 mg  Once     Route: Intravenous  Ordered Dose: 30 mg     Last MAR action:  Given Audriana Aldama JEFFREY             MDM  Number of Diagnoses or Management Options  Peritonsillar abscess: established and worsening  Diagnosis management comments: 5:10 PM  ENT consulted, discussed with Dr. Rondel Jumbo, who suggest IV antibiotics (clindamycin) and admission to hospitalist service and she will follow patient.    5:20 PM  Admission accepted by hospitalist.       Amount and/or Complexity of Data Reviewed  Clinical lab tests: ordered and reviewed  Obtain history from someone other than the patient: yes  Review and summarize past medical records: yes (Review to recent CT scan neck yesterday, with 1 cm diameter left PTA)  Discuss the patient with other providers: yes    Risk of Complications, Morbidity, and/or Mortality  Presenting problems: high  Diagnostic procedures: high  Management options: high    Patient Progress  Patient progress: stable                   Procedures    Clinical Impression & Disposition     Clinical Impression  Final diagnoses:   Peritonsillar abscess        ED Disposition     ED Disposition Condition Date/Time Comment    Admit to [facility]  Wed Jun 29, 2017  5:11 PM            New Prescriptions    No medications on  file                 Herma Ard, MD  06/29/17 [phone]

## 2017-06-29 NOTE — H&P (Signed)
[facility] HOSPITALIST  H&P    Patient Info:   Date/Time: 06/29/2017 / 5:32 PM   Admit Date:06/29/2017  Patient Name:Christopher Trevino   ZOX:[id number]   PCP: Daisey Must, DO  Attending Physician:Traficante, Diane Tula Nakayama, DO     Assessment and Plan:     1. Trouble swallowing with increased swelling in his throat for which patient was diagnosed with tonsillitis yesterday in the ER. Ct scan showed small 1 cm tiny collection of peritonsillar soft tissue inferiorly on the left could be an abscess.  Case discussed yesterday with Dr. Skip Mayer, states it is too small to drain, recommend clindamycin and medrol dose pack and was told she would see the patient in the office, 1/4. Even after taking clindamycin and his 1st dose of Medrol Dosepak.  He feels like his swelling is increasing and unable to take by mouth. No leukocytosis on admission.  He had been on Augmentin since 06/26/17 prior to yesterday with no relief    We will start an IV clindamycin and will consult infectious disease in house.   Start on IV Decadron    Start on clear liquids and advance diet as tolerated   ENT Dr. Skip Mayer will see tomorrowso will leave nothing by mouth after midnight   IVF   Morphine as needed as patient says oxycodone did not work for him.  Outpatient   Continue to monitor complete blood count and for spiking temps    2. Hyperglycemia without diagnosis of diabetes, most likely steroid induced, as well as stress-induced secondary to number 1.  Monitor BMP at this point    3. History of migraines, which we will order Imitrex as needed.  No current complaints at the bedside    DVT Prophylaxis: SCD's    Code Status: Full  Disposition: Home  Type of Admission:Inpatient  Estimated Length of Stay (including stay in the ER receiving treatment): Greater then 2 midnights due to failing outpatient antibiotics   Foley and Central Lines: none  Medical Necessity for stay: IV antibiotics and steroids with ID and ENT consult   Milestones:   clearance from ENT and ID     Clinical Presentation:   History of Presenting Illness:   Christopher Trevino is a 42 y.o. male who has history of History reviewed. No pertinent surgical history.   Past Medical History:   Diagnosis Date   . Depression screening negative 02-04-12    Neg   . History of migraine      This Caucasian 42 year old male with a past medical history see above comes in to the ER complaining of increasing throat discomfort and swelling.  Patient says for the last few weeks she has been struggling with postnasal drainage but on Sunday 12/30, he started having left ear discomfort and pressure, so he went to urgent care and was prescribed Augmentin twice a day for any earache.  He started taking it, but his symptoms did not improve, so he came to the ER 06/28/17 and was diagnosed with tonsillitis and a small left periaortic tonsil abscess that was 1 cm. He was prescribed Medrol Dosepak and clindamycin and told to follow-up with ENT on Friday. Patient said he went home and was feeling okay, but he feels the medications wore off from the ER, he started having increasing throat discomfort again.  He was unable to sleep all night long, but did take his 1st dose of his Medrol dose pack this morning with the clindamycin. He felt as the afternoon  went on his throat was swelling more and he was unable to take any by mouth intake, so came back to the ER for evaluation.  He does feel better after IV morphine in the ER.  He denies any chest pain, shortness of breath, palpitation, abdominal pain, nausea, vomiting, diarrhea, constipation, melena, hematuria, focal weakness, numbness, dizziness, syncope, overt fever or any other complaints    Review of Systems:   A comprehensive review of systems was negative except for the underlying highlighted in Bold     Constitutional: chills, weight loss, malaise/fatigue and diaphoresis.   HENT: hearing loss, ear pain, nosebleeds, congestion, sore throat, neck pain, tinnitus and ear  discharge. Throat pain   Eyes: blurred vision, double vision, photophobia, pain, discharge and redness.   Respiratory: cough, hemoptysis, sputum production, shortness of breath, wheezing and stridor.   Cardiovascular: chest pain, palpitations, orthopnea, claudication, leg swelling and PND.   Gastrointestinal: heartburn, nausea, vomiting, abdominal pain, diarrhea, constipation, blood in stool and melena.   Genitourinary: dysuria, urgency, frequency, hematuria and flank pain.   Musculoskeletal: myalgias, back pain, joint pain and falls.   Skin: itching and rash.   Neurological: dizziness, tingling, tremors, sensory change, speech change, focal weakness, seizures, loss of consciousness, weakness and headaches.   Endo/Heme/Allergies: environmental allergies and polydipsia. Does not bruise/bleed easily.   Psychiatric/Behavioral: depression, suicidal ideas, hallucinations, memory loss and substance abuse. The patient is not nervous/anxious and does not have insomnia.     Physical Exam:     Vitals:    06/29/17 1559   BP: 146/84   Pulse: 75   Resp: 18   Temp: 98.7 F (37.1 C)   TempSrc: Temporal Artery   SpO2: 96%   Weight: 68 kg (150 lb)   Height: 1.702 m (5\' 7" )       Constitutional: Patient is oriented to person, place, and time. Patient appears well-developed and well-nourished.   Head: Normocephalic and atraumatic.  Eyes- pupils equal and reactive, extraocular eye movements intact, sclera anicteric  Nose - normal and patent, no erythema, discharge or polyps and normal nontender sinuses  Mouth - mucous membranes moist, pharynx erythemic with postnasal drainage noted and +2-3 left-sided tonsillar edema with uvula shifted   Neck: Normal range of motion. Neck supple. No JVD present. No bruit or thrill noted on bilateral carotids.   Cardiovascular: Normal rate, regular rhythm, normal heart sounds and intact distal pulses. Exam reveals no gallop and no friction rub. No murmur heard.  Pulmonary/Chest: Effort normal and  breath sounds normal. No stridor. No respiratory distress. Patient has no wheezes. No crackles were present. Exhibits no tenderness on palpation.   Abdominal: Soft. Bowel sounds are normal. Patient exhibits no distension and no mass was palpable. There is no tenderness. There is no rebound and no guarding.   Musculoskeletal: Normal range of motion. Patient exhibits no edema and no tenderness.   Lymphadenopathy: Patient has cervical adenopathy.   Neurological: Patient is alert and oriented to person, place, and time and has normal reflexes. No cranial nerve deficit. Normal muscle tone. Coordination normal.   Skin: Skin is warm. No rash noted. Patient is not diaphoretic. No erythema. No pallor.   Psychiatric: Has normal mood and affect. Behavior is normal. Judgment and thought content normal    Physical Exam  Clinical Information and History:   Chief Complaint:  Chief Complaint   Patient presents with   . Abscess   . Oral Swelling     Past Medical History:  Past Medical  History:   Diagnosis Date   . Depression screening negative 02-04-12    Neg   . History of migraine      Past Surgical History:History reviewed. No pertinent surgical history.  Family History:  Family History   Problem Relation Age of Onset   . Hyperlipidemia Mother    . Hypertension Mother    . Hyperlipidemia Father    . Hypertension Father      Social History:  History   Alcohol Use   . 3.6 oz/week   . 6 Cans of beer per week     History   Drug Use No     History   Smoking Status   . Former Smoker   . Packs/day: 0.50   . Years: 3.00   . Quit date: 05/10/1999   Smokeless Tobacco   . Never Used     Social History     Social History   . Marital status: Married     Spouse name: N/A   . Number of children: N/A   . Years of education: N/A     Social History Main Topics   . Smoking status: Former Smoker     Packs/day: 0.50     Years: 3.00     Quit date: 05/10/1999   . Smokeless tobacco: Never Used   . Alcohol use 3.6 oz/week     6 Cans of beer per week   .  Drug use: No   . Sexual activity: Not Asked     Other Topics Concern   . None     Social History Narrative   . None     Allergies:No Known Allergies  Medications: See med rec    Results of Labs/imaging   Labs have been reviewed:   Coagulation Profile:       CBC review:   Recent Labs  Lab 06/29/17  1649 06/28/17  1007   WBC 8.23 8.77   Hgb 15.0 15.9   Hematocrit 42.9 46.4   Platelets 198 168   MCV 90.3 89.7   RDW 11* 11*   Neutrophils 80.5 84.2   Lymphocytes Automated 9.8 8.2   Eosinophils Automated 0.1 0.5   Immature Granulocyte 0.4 0.3   Neutrophils Absolute 6.62 7.38   Absolute Immature Granulocyte 0.03 0.03     Chem Review:  Recent Labs  Lab 06/29/17  1649 06/28/17  1007   Sodium 141 141   Potassium 4.2 4.1   Chloride 105 108   CO2 26 22   BUN 8.4* 8.5*   Creatinine 0.9 0.9   Glucose 112* 107*   Calcium 9.8 9.6   Bilirubin, Total 0.6  --    AST (SGOT) 13  --    ALT 14  --    Alkaline Phosphatase 76  --      Results     Procedure Component Value Units Date/Time    Comprehensive metabolic panel [[id number]]  (Abnormal) Collected:  06/29/17 1649    Specimen:  Blood Updated:  06/29/17 1722     Glucose 112 (H) mg/dL      BUN 8.4 (L) mg/dL      Creatinine 0.9 mg/dL      Sodium 660 mEq/L      Potassium 4.2 mEq/L      Chloride 105 mEq/L      CO2 26 mEq/L      Calcium 9.8 mg/dL      Protein, Total 7.3 g/dL      Albumin 4.4  g/dL      AST (SGOT) 13 U/L      ALT 14 U/L      Alkaline Phosphatase 76 U/L      Bilirubin, Total 0.6 mg/dL      Globulin 2.9 g/dL      Albumin/Globulin Ratio 1.5     Anion Gap 10.0    GFR [[id number]] Collected:  06/29/17 1649     Updated:  06/29/17 1722     EGFR >60.0    CBC with differential [[id number]]  (Abnormal) Collected:  06/29/17 1649    Specimen:  Blood from Blood Updated:  06/29/17 1707     WBC 8.23 x10 3/uL      Hgb 15.0 g/dL      Hematocrit 78.2 %      Platelets 198 x10 3/uL      RBC 4.75 x10 6/uL      MCV 90.3 fL      MCH 31.6 pg      MCHC 35.0 g/dL      RDW 11 (L) %      MPV 11.2 fL       Neutrophils 80.5 %      Lymphocytes Automated 9.8 %      Monocytes 8.7 %      Eosinophils Automated 0.1 %      Basophils Automated 0.5 %      Immature Granulocyte 0.4 %      Nucleated RBC 0.0 /100 WBC      Neutrophils Absolute 6.62 x10 3/uL      Abs Lymph Automated 0.81 x10 3/uL      Abs Mono Automated 0.72 x10 3/uL      Abs Eos Automated 0.01 x10 3/uL      Absolute Baso Automated 0.04 x10 3/uL      Absolute Immature Granulocyte 0.03 x10 3/uL      Absolute NRBC 0.00 x10 3/uL         Radiology reports have been reviewed:  Radiology Results (24 Hour)     ** No results found for the last 24 hours. **          Hospitalist   Signed by:   Danella Maiers Renkes  06/29/2017 5:32 PM    *This note was generated by the Epic EMR system/ Dragon speech recognition and may contain inherent errors or omissions not intended by the user. Grammatical errors, random word insertions, deletions, pronoun errors and incomplete sentences are occasional consequences of this technology due to software limitations. Not all errors are caught or corrected. If there are questions or concerns about the content of this note or information contained within the body of this dictation they should be addressed directly with the author for clarification

## 2017-06-29 NOTE — Plan of Care (Signed)
Problem: Infection  Goal: Free from infection  Outcome: Progressing   06/29/17 1847   OTHER   Free from infection  Assess for signs/symptoms of infection;Utilize isolation precautions per protocol/policy;Assess immunization status;Consult/collaborate with Infection Preventionist;Utilize sepsis protocol       Comments: Clindamycin ordered, steroids, will assess pain and administer pain medications as needed

## 2017-06-29 NOTE — UM Notes (Signed)
UR Review 06/29/17    42 yr old presented to ED on 06/29/17 and placed on IP status @ 1721 for difficulty swollowing         MD Summary:      Assessment:  Seen in ER yesterday for peritonsillar abscess, sent home and given rx for abx and steroids.  States that today he feels worse and that the swelling in his throat has greatly increased.  Has trouble swallowing, barely able to drink any fluids    VS: t 98.7, HR 75, RR 18, 146/84, pain 10/10    Labs: WNL    ED Meds:  NS bolus  toradol 30mg  IV    DX:  Peritonsillar abscess

## 2017-06-29 NOTE — ED Triage Notes (Signed)
Seen in ER yesterday for peritonsillar abscess, sent home and given rx for abx and steroids.  States that today he feels worse and that the swelling in his throat has greatly increased.  Has trouble swallowing, barely able to drink any fluids.  No bleeding noted.  No CP/TB but states that it is getting harder to get a good breath in.  No weapons noted.

## 2017-06-30 LAB — BASIC METABOLIC PANEL
Anion Gap: 8 (ref 5.0–15.0)
BUN: 8.4 mg/dL — ABNORMAL LOW (ref 9.0–28.0)
CO2: 25 mEq/L (ref 22–29)
Calcium: 8.8 mg/dL (ref 8.5–10.5)
Chloride: 107 mEq/L (ref 100–111)
Creatinine: 0.9 mg/dL (ref 0.7–1.3)
Glucose: 101 mg/dL — ABNORMAL HIGH (ref 70–100)
Potassium: 4.4 mEq/L (ref 3.5–5.1)
Sodium: 140 mEq/L (ref 136–145)

## 2017-06-30 LAB — CBC
Absolute NRBC: 0 10*3/uL
Hematocrit: 38.4 % — ABNORMAL LOW (ref 42.0–52.0)
Hgb: 13.3 g/dL (ref 13.0–17.0)
MCH: 31.1 pg (ref 28.0–32.0)
MCHC: 34.6 g/dL (ref 32.0–36.0)
MCV: 89.7 fL (ref 80.0–100.0)
MPV: 10.9 fL (ref 9.4–12.3)
Nucleated RBC: 0 /100 WBC (ref 0.0–1.0)
Platelets: 175 10*3/uL (ref 140–400)
RBC: 4.28 10*6/uL — ABNORMAL LOW (ref 4.70–6.00)
RDW: 11 % — ABNORMAL LOW (ref 12–15)
WBC: 6.7 10*3/uL (ref 3.50–10.80)

## 2017-06-30 LAB — GFR: EGFR: >60

## 2017-06-30 LAB — PHOSPHORUS: Phosphorus: 4.3 mg/dL (ref 2.3–4.7)

## 2017-06-30 LAB — MAGNESIUM: Magnesium: 2 mg/dL (ref 1.6–2.6)

## 2017-06-30 MED ORDER — HYDROMORPHONE HCL 1 MG/ML IJ SOLN
1.00 mg | INTRAMUSCULAR | Status: AC | PRN
Start: 2017-06-30 — End: 2017-07-02
  Administered 2017-06-30 – 2017-07-01 (×4): 1 mg via INTRAVENOUS
  Filled 2017-06-30 (×5): qty 1

## 2017-06-30 MED ORDER — SODIUM CHLORIDE 0.9 % IV MBP
2.00 g | INTRAVENOUS | Status: DC
Start: 2017-07-01 — End: 2017-07-02
  Administered 2017-07-01 – 2017-07-02 (×2): 2 g via INTRAVENOUS
  Filled 2017-06-30 (×2): qty 2000

## 2017-06-30 MED ORDER — KETOROLAC TROMETHAMINE 30 MG/ML IJ SOLN
15.00 mg | Freq: Four times a day (QID) | INTRAMUSCULAR | Status: DC | PRN
Start: 2017-06-30 — End: 2017-07-02
  Administered 2017-06-30 – 2017-07-02 (×7): 15 mg via INTRAVENOUS
  Filled 2017-06-30 (×7): qty 1

## 2017-06-30 MED ORDER — POLYETHYLENE GLYCOL 3350 17 G PO PACK
17.00 g | PACK | Freq: Every day | ORAL | Status: DC
Start: 2017-06-30 — End: 2017-07-02
  Administered 2017-06-30 – 2017-07-02 (×2): 17 g via ORAL
  Filled 2017-06-30 (×2): qty 1

## 2017-06-30 MED ORDER — CLINDAMYCIN PHOSPHATE IN D5W 900 MG/50ML IV SOLN
900.00 mg | Freq: Three times a day (TID) | INTRAVENOUS | Status: DC
Start: 2017-06-30 — End: 2017-07-02
  Administered 2017-06-30 – 2017-07-02 (×6): 900 mg via INTRAVENOUS
  Filled 2017-06-30 (×7): qty 50

## 2017-06-30 MED ORDER — SODIUM CHLORIDE 0.9 % IV MBP
1.00 g | INTRAVENOUS | Status: DC
Start: 2017-06-30 — End: 2017-06-30
  Administered 2017-06-30: 13:00:00 1 g via INTRAVENOUS
  Filled 2017-06-30: qty 1000

## 2017-06-30 NOTE — UM Notes (Signed)
42 yr old presented to ED on 06/29/17 and placed on IP status @ 1721 for difficulty swollowing     MD Summary:      Assessment:  Seen in ER yesterday for peritonsillar abscess, sent home and given rx for abx and steroids. States that today he feels worse and that the swelling in his throat has greatly increased. Has trouble swallowing, barely able to drink any fluids    VS: t 98.7, HR 75, RR 18, 146/84, pain 10/10    Labs: WNL    ED Meds:  NS bolus  toradol 30mg  IV        Inpatient status on unit    DX:  Peritonsillar abscess    Medicine H/P:  1. Trouble swallowing with increased swelling in his throat for which patient was diagnosed with tonsillitis yesterday in the ER. Ct scan showed small 1 cm tiny collection of peritonsillar soft tissue inferiorly on the left could be an abscess. Case discussed yesterdaywith Dr. Skip Mayer, states it is too small to drain, recommend clindamycin and medrol dose packand was told she would see the patient in the office, 1/4. Even after taking clindamycin and his 1st dose of Medrol Dosepak. He feels like his swelling is increasing and unable to take by mouth.No leukocytosis on admission. He had been on Augmentin since 06/26/17 prior to yesterday with no relief    We will start an IV clindamycin and will consult infectious disease in house.   Start on IV Decadron    Start on clear liquids and advance diet as tolerated   ENT Dr. Skip Mayer will see tomorrowso will leave nothing by mouth after midnight   IVF   Morphine as needed as patient says oxycodone did not work for him. Outpatient   Continue to monitor complete blood count and for spiking temps    2. Hyperglycemia without diagnosis of diabetes, most likely steroid induced, as well as stress-induced secondary to number 1. Monitor BMP at this point      Orders:  NS @ 139ml/hr  Clindamycin 600mg  IV  Decadron 10mg  IV  BMP, CBC, mag, phos draws  NPO 06/30/16 @ 0001  VS Q 4 H    1/3 remains on  Medical    V/S:  T 98.7, P 84, 02 98%, R 18, BP 138/87, pain 5/10    Abn labs:  hct 38.4, rbc 4.28, glucose 101, BUN 8.4,     ENT EVAL:  Laryngoscopy: epiglottic edema and erythema with left lateral epiglottic exudates, blunting of left vallecula, otherwise no edema/erythema of the endolarynx.    Assessment: Patient with pharyngitis, epiglottitis with small lateral pharyngeal abscess. Admitted with IV clindamycin and Decadron.  Plan:  Would add Rocephin as well  Continue with Decadron 8 mg IV Q8H  Continue pulse ox monitoring  NPO PMN again with re-evaluation in am

## 2017-06-30 NOTE — Plan of Care (Signed)
Problem: Pain  Goal: Pain at adequate level as identified by patient  Outcome: Progressing   06/30/17 1610   Goal/Interventions addressed this shift   Pain at adequate level as identified by patient Identify patient comfort function goal;Assess for risk of opioid induced respiratory depression, including snoring/sleep apnea. Alert healthcare team of risk factors identified.;Assess pain on admission, during daily assessment and/or before any "as needed" intervention(s);Reassess pain within 30-60 minutes of any procedure/intervention, per Pain Assessment, Intervention, Reassessment (AIR) Cycle;Evaluate if patient comfort function goal is met;Evaluate patient's satisfaction with pain management progress;Offer non-pharmacological pain management interventions;Include patient/patient care companion in decisions related to pain management as needed   Patient reports minimal pain relief from medication management, that his throat just hurts all the time and it is causing ear and head pain as well

## 2017-06-30 NOTE — Plan of Care (Signed)
Problem: Safety  Goal: Patient will be free from injury during hospitalization  Outcome: Progressing   06/30/17 1018   Goal/Interventions addressed this shift   Patient will be free from injury during hospitalization  Assess patient's risk for falls and implement fall prevention plan of care per policy;Provide and maintain safe environment;Ensure appropriate safety devices are available at the bedside;Include patient/ family/ care giver in decisions related to safety;Assess for patients risk for elopement and implement Elopement Risk Plan per policy;Hourly rounding     Goal: Patient will be free from infection during hospitalization  Outcome: Progressing   06/30/17 1018   Goal/Interventions addressed this shift   Free from Infection during hospitalization Assess and monitor for signs and symptoms of infection;Monitor lab/diagnostic results;Monitor all insertion sites (i.e. indwelling lines, tubes, urinary catheters, and drains)       Problem: Pain  Goal: Pain at adequate level as identified by patient  Outcome: Progressing   06/30/17 1018   Goal/Interventions addressed this shift   Pain at adequate level as identified by patient Identify patient comfort function goal;Assess for risk of opioid induced respiratory depression, including snoring/sleep apnea. Alert healthcare team of risk factors identified.;Assess pain on admission, during daily assessment and/or before any "as needed" intervention(s);Reassess pain within 30-60 minutes of any procedure/intervention, per Pain Assessment, Intervention, Reassessment (AIR) Cycle;Evaluate if patient comfort function goal is met;Evaluate patient's satisfaction with pain management progress;Offer non-pharmacological pain management interventions       Problem: Side Effects from Pain Analgesia  Goal: Patient will experience minimal side effects of analgesic therapy  Outcome: Progressing   06/30/17 1018   Goal/Interventions addressed this shift   Patient will experience minimal  side effects of analgesic therapy Monitor/assess patient's respiratory status (RR depth, effort, breath sounds);Assess for changes in cognitive function;Prevent/manage side effects per LIP orders (i.e. nausea, vomiting, pruritus, constipation, urinary retention, etc.);Evaluate for opioid-induced sedation with appropriate assessment tool (i.e. POSS)

## 2017-06-30 NOTE — Consults (Signed)
INFECTIOUS DISEASE CONSULT  Alfonzo Beers, MD, FACP          Date Time: 06/30/17 5:34 PM  Patient Name: Christopher Trevino  Referring Physician: Zada Finders, MD; Freddi Che, D.O.      Reason for consult:     Tonsillitis, peritonsillar abscess    History of present illness:     Christopher Trevino CSN:[id number],[id number] is a 42 y.o. male, with history significant for migraines and depression, who was admitted with worsening shortness of breath, for last 5 days, associated with, left otalgia plan, dysphagia, and muffled voice, but no drooling. His CT scan was consistent with acute tonsillitis with possible small abscess. He's been followed by ENT. Still complains of sore throat, 7/10, constant, dull, worsens with eating, no relieving factors, associated with, generalized fatigue, malaise, and husky voice. Denies any hematemesis, hemoptysis, melena. Denies any seizure activity or hallucinations    Review of systems:     Constitutional:  Complains of fevers, chills, malaise/fatigue.  HEENT: Complains of sore throat, ear pain, difficult in swallowing. Complains of ear pain. Denies any blurred vision, double vision, photophobia, pain, discharge and redness.   Respiratory:  Denies any cough, hemoptysis, sputum production, shortness of breath, wheezing and stridor.   Cardiovascular: Denies any chest pain, palpitations, orthopnea, claudication, leg swelling.   Gastrointestinal: Denies any heartburn, nausea, vomiting, abdominal pain, diarrhea, constipation, blood in stool and melena.   Genitourinary: Denies any dysuria, urgency, frequency, hematuria and flank pain.   Musculoskeletal: Denies any myalgias, back pain, joint pain and falls.   Skin: Denies any itching and rash.   Neurological: Denies any dizziness, tingling, tremors, sensory change, speech change, focal weakness, seizures, loss of consciousness, weakness. History of migraines  Endo/Heme/Allergies: Denies any environmental allergies and  polydipsia. Does not bruise/bleed easily.   Psychiatric/Behavioral: History of depression  Other review of systems are noncontributory.    Allergies:     No Known Allergies    Past medical history:     Past Medical History:   Diagnosis Date   . Depression screening negative 02-04-12    Neg   . History of migraine        Past surgical history:     History reviewed. No pertinent surgical history.    Family history:     Family History   Problem Relation Age of Onset   . Hyperlipidemia Mother    . Hypertension Mother    . Hyperlipidemia Father    . Hypertension Father        Social history:     History   Alcohol Use   . 3.6 oz/week   . 6 Cans of beer per week     History   Drug Use No     History   Smoking Status   . Former Smoker   . Packs/day: 0.50   . Years: 3.00   . Quit date: 05/10/1999   Smokeless Tobacco   . Never Used       Medications:     Current Facility-Administered Medications   Medication Dose Route Frequency   . [START ON 07/01/2017] cefTRIAXone  2 g Intravenous Q24H   . clindamycin  900 mg Intravenous Q8H   . dexamethasone  10 mg Intravenous Daily   . docusate sodium  100 mg Oral BID   . famotidine  20 mg Oral Q12H SCH   . lactobacillus/streptococcus  1 capsule Oral Daily   . polyethylene glycol  17 g Oral Daily  Physical Exam:     Blood pressure 123/81, pulse 68, temperature 97.7 F (36.5 C), temperature source Temporal Artery, resp. rate 18, height 1.702 m (5' 7.01"), weight 68 kg (150 lb), SpO2 97 %.    General Appearance:  Sick-looking  HEENT: Pallor negative, Anicteric sclera, bilateral tonsillar enlargement  Neck: Supple  Lungs: Normal respiratory rate and normal effort. Not in respiratory distress. Breath sounds clear to auscultation. No wheezes, rales, rhonchi.   Chest Wall: Symmetric chest wall expansion.   Heart : S1 and S2. no murmurs, rub, gallop  Abdomen: Abdomen is soft, scaphoid and non-distended. There are no signs of ascites. Bowel sounds are normal. There is no abdominal  tenderness. There is no mass. There is no splenomegaly or hepatomegaly.  Neurological: Alert and oriented to person, place and time. Normal strength. No gross defect.   Extremities: Normal range of motion.  Skin: Warm and dry. No rash or ecchymosis   Psychiatric:  Looks little anxious    Labs:     Recent Labs      06/30/17   0531  06/29/17   1649   WBC  6.70  8.23   Hgb  13.3  15.0   Hematocrit  38.4*  42.9   Platelets  175  198   MCV  89.7  90.3       Recent Labs      06/30/17   0531  06/29/17   1649   Sodium  140  141   Potassium  4.4  4.2   Chloride  107  105   CO2  25  26   BUN  8.4*  8.4*   Creatinine  0.9  0.9   Glucose  101*  112*   Calcium  8.8  9.8   Magnesium  2.0   --    Phosphorus  4.3   --        Recent Labs      06/29/17   1649   AST (SGOT)  13   ALT  14   Alkaline Phosphatase  76   Protein, Total  7.3   Albumin  4.4   Bilirubin, Total  0.6       Imaging studies:     CT: 1. Findings consistent with acute tonsillitis, with asymmetric  enlargement and heterogeneous enhancement of the left palatine tonsil.  There is a tiny collection involving the peritonsillar soft tissues  inferiorly on the left which measures up to 1 cm, suspicious for a tiny  abscess in this setting.  2. There is asymmetric soft tissue fullness involving the nasopharynx on  the left which likely reflects associated inflammatory change. No  discrete mass lesion is identified. Clinical correlation is recommended.  3. Asymmetrically enlarged cervical lymph nodes on the left are likely  reactive.     Assessment :     Christopher Trevino is a 42 y.o. male, with:     Acute tonsillitis   Peritonsillar abscess   Depression   Migraines    Recommendations:     I would like to suggest following approach:     Rocephin 2 g IV q. Daily   Clindamycin 900 mg IV q.8 hours   Correction of electrolytes   ENT follow-up   We'll follow cultures   Consider repeat CT scan if pain persists   Blood cultures if spikes more than 100.5    We'll adjust  the antimicrobials according to the cultures and clinical course     I  will follow this patient closely with you    Thank you Diane / Drucilla Schmidt for involving me in care of Christopher Trevino          Signed by: Alfonzo Beers, MD, FACP  Date Time: 06/30/17 5:34 PM      *This note was generated by the Epic EMR system/ Dragon speech recognition and may contain inherent errors or omissions not intended by the user. Grammatical errors, random word insertions, deletions, pronoun errors and incomplete sentences are occasional consequences of this technology due to software limitations. Not all errors are caught or corrected. If there are questions or concerns about the content of this note or information contained within the body of this dictation they should be addressed directly with the author for clarification

## 2017-06-30 NOTE — Consults (Signed)
Cc: sore throat    HPI: Patient is a pleasant 42 y/o gentleman with a progressive sore throat starting on 06/25/17. This has been associated with left otalgia, dysphagia, and odynophagia but no trismus or difficulty breathing.  Mild change in vocal quality but no drooling or significant muffled voice.    PMHx:  Depression, Migraine  PSHx: negative  Meds:  IV clindamycin and decadron  All: NKDA  FHx: non-contributory  Social Hx: 6 cans beer/week, no drugs, former smoker  ROS: As in history otherwise all other systems negative    PE: NAD  Vitals:    06/30/17 0714   BP: 128/80   Pulse: 62   Resp: 16   Temp: 98.4 F (36.9 C)   SpO2: 98%   no trismus  No drooling  No muffled voice  No stridor  Head: normocephalic  Eyes: EOMI  Ears: left tympanic membrane red and bulging  Nose: mild mucosal inflammation and erythema  Oral cavity: clear  Oropharynx: 2-3+ left tonsil/2+ right tonsil, minimal asymmetry but no significant soft palate effacement  Neck: left cervical tender lymphadenopathy  Cutaneous: no head and neck lesions  Psych:normal mood and affect  Neuro: AAOx3  Pulm: no distress  CV: no pitting edema    CT Neck images from 06/28/17 reviewed.      Recent Labs  Lab 06/30/17  0531   WBC 6.70   Hgb 13.3   Hematocrit 38.4*   Platelets 175       Laryngoscopy: epiglottic edema and erythema with left lateral epiglottic exudates, blunting of left vallecula, otherwise no edema/erythema of the endolarynx.    Assessment: Patient with pharyngitis, epiglottitis with small lateral pharyngeal abscess. Admitted with IV clindamycin and Decadron.  Plan:  Would add Rocephin as well  Continue with Decadron 8 mg Q8H  Continue pulse ox monitoring  NPO PMN again with re-evaluation in am

## 2017-06-30 NOTE — Progress Notes (Signed)
[facility]  Progress Note  Patient Info:   Date/Time: 06/30/2017 / 4:24 PM   Admit Date:06/29/2017  Patient Name:Christopher Trevino   ZOX:[id number]   PCP: Daisey Must, DO  Attending Physician:Carlon Chaloux, Drucilla Schmidt, MD     Assessment and Plan:    Patient is having sore throat from last 4 days.  He is also complaining of left ear pain, difficulty swallowing and painful swallowing much better despite he is on IV clindamycin, started on IV Rocephin.  He is on Decadron.  Patient is seen by ENT surgeon .  There is a tiny collection involving the.  Positive soft tissue inferior on the left which measure up to 1 cm suspicious for a tiny abscess in the setting.  Patient is evaluated and started on diet.  Continue with medication.  Keep him nothing by mouth from midnight.  Further plan and recommendation per A and P surgeon.   Severe pain not controlled with 2 mg of IV morphine I will start him on IV Dilaudid 1 mg every 4 hour as needed  DVT Prohylaxis:N/A   Central Line/Foley Catheter/PICC line status: not needed.  Code Status: Full Code  Disposition:home  Type of Admission:Inpatient  Expected Date of Discharge: once stable  Milestones required for discharge:once stable  Hospital Problems:   Principal Problem:    Peritonsillar abscess  Active Problems:    Failure of outpatient treatment    Hyperglycemia    History of migraine    Subjective:   06/30/17  Chief Complaint:  Abscess and Oral Swelling    Review of Systems   Constitutional: Positive for chills. Negative for fever and malaise/fatigue.   HENT: Positive for ear pain, sinus pain and sore throat. Negative for hearing loss and tinnitus.    Eyes: Negative for blurred vision, double vision, photophobia and pain.   Respiratory: Negative for cough, hemoptysis and sputum production.    Cardiovascular: Negative for chest pain, palpitations and orthopnea.   Gastrointestinal: Negative for abdominal pain, heartburn, nausea and vomiting.   Genitourinary: Negative for  dysuria, frequency and urgency.   Musculoskeletal: Negative for back pain, myalgias and neck pain.   Skin: Negative for itching and rash.   Neurological: Negative for dizziness, tingling, tremors, sensory change, speech change, weakness and headaches.   Endo/Heme/Allergies: Negative for environmental allergies. Does not bruise/bleed easily.   Psychiatric/Behavioral: Negative for depression, hallucinations, substance abuse and suicidal ideas.     Objective:     Vitals:    06/30/17 0349 06/30/17 0714 06/30/17 1114 06/30/17 1517   BP: 108/59 128/80 138/87 123/81   Pulse: 71 62 84 68   Resp: 18 16 18 18    Temp: 98.1 F (36.7 C) 98.4 F (36.9 C) 98.7 F (37.1 C) 97.7 F (36.5 C)   TempSrc: Temporal Artery Temporal Artery Temporal Artery Temporal Artery   SpO2: 97% 98% 97% 97%   Weight:       Height:         Physical Exam:   Physical Exam   Constitutional: He is oriented to person, place, and time and well-developed, well-nourished, and in no distress. No distress.   HENT:   Head: Normocephalic and atraumatic.   Right Ear: External ear normal.   Left Ear: External ear normal.   Mouth/Throat: No oropharyngeal exudate.   Eyes: Pupils are equal, round, and reactive to light. Conjunctivae and EOM are normal. Right eye exhibits no discharge. Left eye exhibits no discharge. No scleral icterus.   Neck: Normal range of  motion. Neck supple. No JVD present. No tracheal deviation present. No thyromegaly present.   Cardiovascular: Normal rate, regular rhythm and normal heart sounds.  Exam reveals no gallop and no friction rub.    No murmur heard.  Pulmonary/Chest: Effort normal and breath sounds normal. No stridor. No respiratory distress. He has no wheezes. He has no rales. He exhibits no tenderness.   Abdominal: Soft. Bowel sounds are normal. He exhibits no distension. There is no tenderness. There is no guarding.   Musculoskeletal: He exhibits no edema or tenderness.   Neurological: He is alert and oriented to person, place,  and time. No cranial nerve deficit.   Skin: Skin is dry. No rash noted. He is not diaphoretic. No erythema. No pallor.   Psychiatric: Mood, memory, affect and judgment normal.     Results of Labs/imaging   Labs and radiology reports have been reviewed.    Hospitalist   Signed by:   Zada Finders  06/30/2017 4:24 PM    *This note was generated by the Epic EMR system/ Dragon speech recognition and may contain inherent errors or omissions not intended by the user. Grammatical errors, random word insertions, deletions, pronoun errors and incomplete sentences are occasional consequences of this technology due to software limitations. Not all errors are caught or corrected. If there are questions or concerns about the content of this note or information contained within the body of this dictation they should be addressed directly with the author for clarification

## 2017-06-30 NOTE — Plan of Care (Signed)
Problem: Pain  Goal: Pain at adequate level as identified by patient  Patient states is  Able to swallow so much better after Dilaudid. Mouth appears reddened, but pustules or pockets visible. Patient able to open mouth without any difficulty. Given Dilaudid at the beginning of the shift, but will encourage Torodol next time. Slept after the Dilaudid.

## 2017-07-01 ENCOUNTER — Encounter
Admission: EM | Disposition: A | Discharge status: Home / Self Care | Payer: Self-pay | Source: Home / Self Care | Attending: Internal Medicine

## 2017-07-01 ENCOUNTER — Inpatient Hospital Stay: Payer: No Typology Code available for payment source

## 2017-07-01 ENCOUNTER — Inpatient Hospital Stay: Payer: No Typology Code available for payment source | Admitting: Anesthesiology

## 2017-07-01 HISTORY — PX: INCISION & DRAINAGE, RETROPHARYNGEAL ABSCESS: SHX89005

## 2017-07-01 LAB — CBC AND DIFFERENTIAL
Absolute NRBC: 0 10*3/uL
Basophils Absolute Automated: 0.05 10*3/uL (ref 0.00–0.20)
Basophils Automated: 0.7 %
Eosinophils Absolute Automated: 0.02 10*3/uL (ref 0.00–0.70)
Eosinophils Automated: 0.3 %
Hematocrit: 38.5 % — ABNORMAL LOW (ref 42.0–52.0)
Hgb: 13.5 g/dL (ref 13.0–17.0)
Immature Granulocytes Absolute: 0.02 10*3/uL
Immature Granulocytes: 0.3 %
Lymphocytes Absolute Automated: 1.93 10*3/uL (ref 0.50–4.40)
Lymphocytes Automated: 28.6 %
MCH: 31.6 pg (ref 28.0–32.0)
MCHC: 35.1 g/dL (ref 32.0–36.0)
MCV: 90.2 fL (ref 80.0–100.0)
MPV: 10.8 fL (ref 9.4–12.3)
Monocytes Absolute Automated: 0.97 10*3/uL (ref 0.00–1.20)
Monocytes: 14.4 %
Neutrophils Absolute: 3.76 10*3/uL (ref 1.80–8.10)
Neutrophils: 55.7 %
Nucleated RBC: 0 /100 WBC (ref 0.0–1.0)
Platelets: 177 10*3/uL (ref 140–400)
RBC: 4.27 10*6/uL — ABNORMAL LOW (ref 4.70–6.00)
RDW: 11 % — ABNORMAL LOW (ref 12–15)
WBC: 6.75 10*3/uL (ref 3.50–10.80)

## 2017-07-01 LAB — COMPREHENSIVE METABOLIC PANEL
ALT: 12 U/L (ref 0–55)
AST (SGOT): 10 U/L (ref 5–34)
Albumin/Globulin Ratio: 1.3 (ref 0.9–2.2)
Albumin: 3.5 g/dL (ref 3.5–5.0)
Alkaline Phosphatase: 62 U/L (ref 38–106)
Anion Gap: 8 (ref 5.0–15.0)
BUN: 9.1 mg/dL (ref 9.0–28.0)
Bilirubin, Total: 0.6 mg/dL (ref 0.2–1.2)
CO2: 26 mEq/L (ref 22–29)
Calcium: 9 mg/dL (ref 8.5–10.5)
Chloride: 105 mEq/L (ref 100–111)
Creatinine: 0.9 mg/dL (ref 0.7–1.3)
Globulin: 2.7 g/dL (ref 2.0–3.6)
Glucose: 89 mg/dL (ref 70–100)
Potassium: 3.9 mEq/L (ref 3.5–5.1)
Protein, Total: 6.2 g/dL (ref 6.0–8.3)
Sodium: 139 mEq/L (ref 136–145)

## 2017-07-01 LAB — GFR: EGFR: >60

## 2017-07-01 SURGERY — INCISION AND DRAINAGE, RETROPHARYNGEAL ABSCESS
Anesthesia: Anesthesia General | Laterality: Left | Wound class: Dirty or Infected

## 2017-07-01 MED ORDER — PROPOFOL 10 MG/ML IV EMUL (WRAP)
INTRAVENOUS | Status: DC | PRN
Start: 2017-07-01 — End: 2017-07-01
  Administered 2017-07-01: 150 mg via INTRAVENOUS

## 2017-07-01 MED ORDER — DEXMEDETOMIDINE BOLUS FROM BAG
0.50 ug/kg | Freq: Once | Status: AC
Start: 2017-07-01 — End: 2017-07-01
  Administered 2017-07-01: 18:00:00 35 ug via INTRAVENOUS
  Filled 2017-07-01: qty 8.75

## 2017-07-01 MED ORDER — GENTAMICIN SULFATE 40 MG/ML IJ SOLN
INTRAMUSCULAR | Status: AC
Start: 2017-07-01 — End: ?
  Filled 2017-07-01: qty 2

## 2017-07-01 MED ORDER — BACITRACIN 50000 UNITS IM SOLR
INTRAMUSCULAR | Status: DC | PRN
Start: 2017-07-01 — End: 2017-07-01
  Administered 2017-07-01: 50000 [IU]

## 2017-07-01 MED ORDER — FENTANYL CITRATE (PF) 50 MCG/ML IJ SOLN (WRAP)
25.0000 ug | INTRAMUSCULAR | Status: DC | PRN
Start: 2017-07-01 — End: 2017-07-01

## 2017-07-01 MED ORDER — MIDAZOLAM HCL 2 MG/2ML IJ SOLN
INTRAMUSCULAR | Status: DC | PRN
Start: 2017-07-01 — End: 2017-07-01
  Administered 2017-07-01: 2 mg via INTRAVENOUS

## 2017-07-01 MED ORDER — GLYCOPYRROLATE 0.2 MG/ML IJ SOLN
INTRAMUSCULAR | Status: DC | PRN
Start: 2017-07-01 — End: 2017-07-01
  Administered 2017-07-01: 0.2 mg via INTRAVENOUS

## 2017-07-01 MED ORDER — CEFAZOLIN SODIUM 1 G IJ SOLR
INTRAMUSCULAR | Status: AC
Start: 2017-07-01 — End: ?
  Filled 2017-07-01: qty 1000

## 2017-07-01 MED ORDER — ONDANSETRON HCL 4 MG/2ML IJ SOLN
INTRAMUSCULAR | Status: DC | PRN
Start: 2017-07-01 — End: 2017-07-01
  Administered 2017-07-01: 4 mg via INTRAVENOUS

## 2017-07-01 MED ORDER — LIDOCAINE HCL (PF) 4 % IJ SOLN
5.00 mL | Freq: Once | INTRAMUSCULAR | Status: DC
Start: 2017-07-01 — End: 2017-07-02

## 2017-07-01 MED ORDER — PROPOFOL 10 MG/ML IV EMUL (WRAP)
INTRAVENOUS | Status: AC
Start: 2017-07-01 — End: ?
  Filled 2017-07-01: qty 20

## 2017-07-01 MED ORDER — MIDAZOLAM HCL 2 MG/2ML IJ SOLN
INTRAMUSCULAR | Status: AC
Start: 2017-07-01 — End: ?
  Filled 2017-07-01: qty 2

## 2017-07-01 MED ORDER — ONDANSETRON HCL 4 MG/2ML IJ SOLN
4.0000 mg | Freq: Once | INTRAMUSCULAR | Status: DC | PRN
Start: 2017-07-01 — End: 2017-07-01

## 2017-07-01 MED ORDER — BENZOCAINE 20% MT SOLN (WRAP)
OROMUCOSAL | Status: AC
Start: 2017-07-01 — End: ?
  Filled 2017-07-01: qty 0.57

## 2017-07-01 MED ORDER — BACITRACIN 50000 UNITS IM SOLR
INTRAMUSCULAR | Status: AC
Start: 2017-07-01 — End: ?
  Filled 2017-07-01: qty 50000

## 2017-07-01 MED ORDER — ONDANSETRON HCL 4 MG/2ML IJ SOLN
INTRAMUSCULAR | Status: AC
Start: 2017-07-01 — End: ?
  Filled 2017-07-01: qty 2

## 2017-07-01 MED ORDER — LIDOCAINE HCL 2 % IJ SOLN
INTRAMUSCULAR | Status: DC | PRN
Start: 2017-07-01 — End: 2017-07-01
  Administered 2017-07-01: 100 mg

## 2017-07-01 MED ORDER — CEFAZOLIN SODIUM 1 G IJ SOLR
INTRAMUSCULAR | Status: DC | PRN
Start: 2017-07-01 — End: 2017-07-01
  Administered 2017-07-01: 1 g

## 2017-07-01 MED ORDER — OXYMETAZOLINE HCL 0.05 % NA SOLN
NASAL | Status: AC
Start: 2017-07-01 — End: ?
  Filled 2017-07-01: qty 15

## 2017-07-01 MED ORDER — IOHEXOL 350 MG/ML IV SOLN
100.00 mL | Freq: Once | INTRAVENOUS | Status: AC | PRN
Start: 2017-07-01 — End: 2017-07-01
  Administered 2017-07-01: 14:00:00 100 mL via INTRAVENOUS

## 2017-07-01 MED ORDER — DEXAMETHASONE SODIUM PHOSPHATE 4 MG/ML IJ SOLN (WRAP)
8.00 mg | Freq: Three times a day (TID) | INTRAMUSCULAR | Status: DC
Start: 2017-07-01 — End: 2017-07-02
  Administered 2017-07-01 – 2017-07-02 (×4): 8 mg via INTRAVENOUS
  Filled 2017-07-01: qty 2
  Filled 2017-07-01 (×2): qty 1
  Filled 2017-07-01 (×3): qty 2

## 2017-07-01 MED ORDER — LIDOCAINE HCL (PF) 4 % IJ SOLN
INTRAMUSCULAR | Status: AC
Start: 2017-07-01 — End: ?
  Filled 2017-07-01: qty 5

## 2017-07-01 MED ORDER — GENTAMICIN SULFATE 40 MG/ML IJ SOLN
INTRAMUSCULAR | Status: DC | PRN
Start: 2017-07-01 — End: 2017-07-01
  Administered 2017-07-01: 80 mg

## 2017-07-01 MED ORDER — EPINEPHRINE HCL 1 MG/ML IJ SOLN (WRAP)
Status: AC
Start: 2017-07-01 — End: ?
  Filled 2017-07-01: qty 2

## 2017-07-01 MED ORDER — LIDOCAINE-EPINEPHRINE 1 %-1:100000 IJ SOLN
INTRAMUSCULAR | Status: AC
Start: 2017-07-01 — End: ?
  Filled 2017-07-01: qty 30

## 2017-07-01 MED ORDER — SUCCINYLCHOLINE CHLORIDE 20 MG/ML IJ SOLN
INTRAMUSCULAR | Status: AC
Start: 2017-07-01 — End: ?
  Filled 2017-07-01: qty 10

## 2017-07-01 MED ORDER — LIDOCAINE HCL (PF) 2 % IJ SOLN
INTRAMUSCULAR | Status: AC
Start: 2017-07-01 — End: ?
  Filled 2017-07-01: qty 5

## 2017-07-01 MED ORDER — LACTATED RINGERS IV SOLN
1000.0000 mL | INTRAVENOUS | Status: DC
Start: 2017-07-01 — End: 2017-07-02
  Administered 2017-07-01: 18:00:00 1000 mL via INTRAVENOUS

## 2017-07-01 MED ORDER — SUCCINYLCHOLINE CHLORIDE 20 MG/ML IJ SOLN
INTRAMUSCULAR | Status: DC | PRN
Start: 2017-07-01 — End: 2017-07-01
  Administered 2017-07-01: 50 mg via INTRAVENOUS

## 2017-07-01 MED ORDER — GLYCOPYRROLATE 0.2 MG/ML IJ SOLN
INTRAMUSCULAR | Status: AC
Start: 2017-07-01 — End: ?
  Filled 2017-07-01: qty 1

## 2017-07-01 MED ORDER — METOCLOPRAMIDE HCL 5 MG/ML IJ SOLN
10.0000 mg | Freq: Once | INTRAMUSCULAR | Status: DC | PRN
Start: 2017-07-01 — End: 2017-07-01

## 2017-07-01 SURGICAL SUPPLY — 15 items
CATH URETHRAL RED RUBBER 10F (Catheter Micellaneous) ×1
CATH URETHRAL RED RUBBER 10F (Catheter Miscellaneous) ×1 IMPLANT
FOOTSWITCH SUCTION CNTRL 10FR (Suction) IMPLANT
GLOVE SURG SUPER-SENSER SZ6.5 (Glove) ×2 IMPLANT
KIT HEAD AND NECK (Pack) ×2 IMPLANT
SUTURE CHROMIC 3-0 RB1 27IN (Suture) IMPLANT
SWAB CULT NYL PP FBR LQ AMIES 1ML REG (Procedure Accessories)
SWAB CULTURE REGULAR SCREW CAP TUBE (Procedure Accessories) IMPLANT
SYRINGE LUER LOCK 10CC (Syringes, Needles) ×2 IMPLANT
TUBE NG PVC S SMP 14FR 48IN LF STRL (Tubing) ×2 IMPLANT
TUBE OD14 FR INTEGRAL FUNNEL CONNECTOR 2 LUMEN 5 IN 1 ADAPTER (Tubing) ×1 IMPLANT
TUBING SCT PVC ARG 3/16IN 10FT LF STRL (Tubing) ×2
TUBING SUCTION ID3/16 IN L10 FT (Tubing) ×1 IMPLANT
TUBING SUCTION ID3/16 IN L10 FT NONCONDUCTIVE STRAIGHT MALE FEMALE (Tubing) ×1 IMPLANT
WAND ENT EVAC XTRA HP COBLATOR (Ablation) IMPLANT

## 2017-07-01 NOTE — Brief Op Note (Signed)
Preoperative Dx: Left pharyngeal abscess    Postoperative Dx: same    Procedure: I+D of left pharyngeal abscess    Anesthesia: GETA    Surgeon: Rondel Jumbo    EBL: 2 cc    IVF: 500 cc    Complications: N/A    Cultures: aerobic and anaerobic    Findings: Defer to dictated op note    Dispo: Extubated stable to PACU

## 2017-07-01 NOTE — Progress Notes (Signed)
[facility]  Progress Note  Patient Info:   Date/Time: 07/01/2017 / 3:31 PM   Admit Date:06/29/2017  Patient Name:Christopher Trevino   HYQ:[id number]   PCP: Daisey Must, DO  Attending Physician:Richardine Peppers, Drucilla Schmidt, MD     Assessment and Plan:    Patient is having sore throat from last 5 days.  He is also complaining of left ear pain, difficulty swallowing and painful swallowing much better now, he is on IV clindamycin, started on IV Rocephin yestrerday.  He is on Decadron as well.  Patient is seen by ENT surgeon .  There is a tiny collection involving the soft tissue inferior on the left which measure up to 1 cm suspicious for a tiny abscess in the setting.  Patient is evaluated and started on diet.  Continue with medication.  Keep him nothing by mouth from midnight as repeat ct scan did not show much improvement.  Further plan and recommendation per ENT surgeon.   Severe pain was not controlled with 2 mg of IV morphine, now ell controlled with IV Dilaudid 1 mg every 4 hour as needed  DVT Prohylaxis:N/A   Central Line/Foley Catheter/PICC line status: not needed.  Code Status: Full Code  Disposition:home  Type of Admission:Inpatient  Expected Date of Discharge: once stable  Milestones required for discharge:once stable  Hospital Problems:   Principal Problem:    Peritonsillar abscess  Active Problems:    Failure of outpatient treatment    Hyperglycemia    History of migraine    Subjective:   07/01/17  Chief Complaint:  Abscess and Oral Swelling    Review of Systems   Constitutional: Positive for chills. Negative for fever and malaise/fatigue.   HENT: Positive for ear pain, sinus pain and sore throat. Negative for hearing loss and tinnitus.    Eyes: Negative for blurred vision, double vision, photophobia and pain.   Respiratory: Negative for cough and hemoptysis.    Cardiovascular: Negative for chest pain, palpitations and orthopnea.   Gastrointestinal: Negative for abdominal pain, heartburn, nausea and  vomiting.   Genitourinary: Negative for dysuria, frequency and urgency.   Musculoskeletal: Negative for back pain, myalgias and neck pain.   Skin: Negative for itching and rash.   Neurological: Negative for dizziness, tingling, tremors, sensory change, speech change, weakness and headaches.   Endo/Heme/Allergies: Negative for environmental allergies. Does not bruise/bleed easily.   Psychiatric/Behavioral: Negative for depression, hallucinations, substance abuse and suicidal ideas.     Objective:     Vitals:    07/01/17 0010 07/01/17 0410 07/01/17 0800 07/01/17 1233   BP: 125/70  126/80 129/76   Pulse:   66 62   Resp: 16 16 18 18    Temp: 98.3 F (36.8 C) 98.9 F (37.2 C) 98.2 F (36.8 C) 99.4 F (37.4 C)   TempSrc: Oral Temporal Artery Temporal Artery Temporal Artery   SpO2:   99% 96%   Weight:       Height:         Physical Exam:   Physical Exam   Constitutional: He is oriented to person, place, and time and well-developed, well-nourished, and in no distress. No distress.   HENT:   Head: Normocephalic and atraumatic.   Right Ear: External ear normal.   Left Ear: External ear normal.   Mouth/Throat: No oropharyngeal exudate.   Soar throat   Eyes: Pupils are equal, round, and reactive to light. Conjunctivae and EOM are normal. Right eye exhibits no discharge. Left eye exhibits no discharge.  No scleral icterus.   Neck: Normal range of motion. Neck supple. No JVD present. No tracheal deviation present. No thyromegaly present.   Cardiovascular: Normal rate, regular rhythm and normal heart sounds.  Exam reveals no gallop and no friction rub.    No murmur heard.  Pulmonary/Chest: Effort normal and breath sounds normal. No stridor. No respiratory distress. He has no wheezes. He has no rales. He exhibits no tenderness.   Abdominal: Soft. Bowel sounds are normal. He exhibits no distension. There is no tenderness. There is no guarding.   Musculoskeletal: He exhibits no edema or tenderness.   Neurological: He is alert and  oriented to person, place, and time. No cranial nerve deficit.   Skin: Skin is dry. No rash noted. He is not diaphoretic. No erythema. No pallor.   Psychiatric: Mood, memory, affect and judgment normal.     Results of Labs/imaging   Labs and radiology reports have been reviewed.    Hospitalist   Signed by:   Zada Finders  07/01/2017 3:31 PM    *This note was generated by the Epic EMR system/ Dragon speech recognition and may contain inherent errors or omissions not intended by the user. Grammatical errors, random word insertions, deletions, pronoun errors and incomplete sentences are occasional consequences of this technology due to software limitations. Not all errors are caught or corrected. If there are questions or concerns about the content of this note or information contained within the body of this dictation they should be addressed directly with the author for clarification

## 2017-07-01 NOTE — Plan of Care (Signed)
Pt returned from CT Scan dept, dr Waldemar Dickens called to give update on the pt. Pt remains npo for surgery this evening. Pt updated and aware of the plan of care.

## 2017-07-01 NOTE — Progress Notes (Signed)
Infectious Disease            Progress Note    07/01/2017   Christopher Trevino:[id number],XBJ:[id number] is a 42 y.o. male, with history significant for depression, migraines, admitted with acute tonsillitis and peritonsillar abscess.    Subjective:     Christopher Trevino today Symptoms:  Afebrile, still complains of significant for pain. Complains of dysphagia.  No shortness of breath,cough, chest pain, chest pressure. No nausea, vomiting, diarrhea, abdominal pain. No dysuria, increase frequency. No headache, dizziness or new weakness tingling or numbness. Other review of system is non contributory.    Objective:     Blood pressure 126/80, pulse 66, temperature 98.2 F (36.8 C), temperature source Temporal Artery, resp. rate 18, height 1.702 m (5' 7.01"), weight 68 kg (150 lb), SpO2 99 %.    General Appearance:  Sick-looking; no acute distress  HEENT: Pallor negative, Anicteric sclera, bilateral tonsillar enlargement  Neck: Supple  Lungs: Breath sounds clear to auscultation. No wheezes, rales, rhonchi.   Chest Wall: Symmetric chest wall expansion.   Heart : S1 and S2. no murmurs, rub, gallop  Abdomen: Abdomen is soft, scaphoid and non-distended. There are no signs of ascites. Bowel sounds are normal. There is no abdominal tenderness. There is no mass. There is no splenomegaly or hepatomegaly.  Neurological: Alert and oriented to person, place and time. Normal strength. No gross defect.   Extremities: Normal range of motion.  Psychiatric:  Anxious    Laboratory And Diagnostic Studies:     Recent Labs      07/01/17   0533  06/30/17   0531  06/29/17   1649   WBC  6.75  6.70  8.23   Hgb  13.5  13.3  15.0   Hematocrit  38.5*  38.4*  42.9   Platelets  177  175  198   Neutrophils  55.7   --   80.5     Recent Labs      07/01/17   0533  06/30/17   0531   Sodium  139  140   Potassium  3.9  4.4   Chloride  105  107   CO2  26  25   BUN  9.1  8.4*   Creatinine  0.9  0.9   Glucose  89  101*   Calcium  9.0  8.8      Recent Labs      07/01/17   0533  06/29/17   1649   AST (SGOT)  10  13   ALT  12  14   Alkaline Phosphatase  62  76   Protein, Total  6.2  7.3   Albumin  3.5  4.4   Bilirubin, Total  0.6  0.6       Current Med's:     Current Facility-Administered Medications   Medication Dose Route Frequency   . cefTRIAXone  2 g Intravenous Q24H   . clindamycin  900 mg Intravenous Q8H   . dexamethasone  10 mg Intravenous Daily   . docusate sodium  100 mg Oral BID   . famotidine  20 mg Oral Q12H SCH   . lactobacillus/streptococcus  1 capsule Oral Daily   . polyethylene glycol  17 g Oral Daily       Lines/Drains:     Patient Lines/Drains/Airways Status    Active Lines, Drains and Airways     Name:   Placement date:   Placement time:   Site:   Days:  Peripheral IV 06/29/17 Left Antecubital  06/29/17    1624    Antecubital    1    Peripheral IV 06/29/17 Right Antecubital  06/29/17    1802    Antecubital    1                Assessment:      Condition: Guarded   Acute tonsillitis   Peritonsillar abscess   Depression   Migraines    Plan:      Continue Rocephin   Continue clindamycin   Possible drainage of abscess; today   ENT followup   Correction of electrolytes   Will follow Cultures   Continue supportive care            Christopher Trevino, M.D.,FACP  07/01/2017  9:35 AM          *This note was generated by the Epic EMR system/ Dragon speech recognition and may contain inherent errors or omissions not intended by the user. Grammatical errors, random word insertions, deletions, pronoun errors and incomplete sentences are occasional consequences of this technology due to software limitations. Not all errors are caught or corrected. If there are questions or concerns about the content of this note or information contained within the body of this dictation they should be addressed directly with the author for clarification

## 2017-07-01 NOTE — Progress Notes (Signed)
Patient reports persistent sore throat and dysphagia. His left aural fullness has worsened.  He is on IV clindamycin and rocephin.    PE: NAD  Vitals:    07/01/17 1233   BP: 129/76   Pulse: 62   Resp: 18   Temp: 99.4 F (37.4 C)   SpO2: 96%   no trismus  No muffled voice  No drooling  Oropharynx: no pooling of secretions/4+ left tonsil and 3+ right tonsil with erythema/ no trismus or soft palate effacement  Neck: Cervical lymphadenopathy  Ear: reduced erythema of left tympanic membrane    Laryngoscopy: Patient with progression of exudates to the level of the right arytenoid. There is interarytenoid edema. There is left lateral pharyngeal wall edema with less mass effect on the epiglottis.   There is epiglottic erythema.    Assessment: Patient with tonsillitis and lateral pharyngeal abscess and airway edema and erythema. The airway is not compromised and is adequate.  Plan:  CT Neck to assess progression of abscess  Continue NPO status  IV antibiotics    Addendum: CT Neck reviewed. Plan for OR

## 2017-07-01 NOTE — Plan of Care (Signed)
Pt awake and alert, vss, remains npo for CT Scan, call radiology dept x 2 to check on when pt can come down for test. CT Scan currently scanning ER pt's and will send for pt when spot available. Pt updated and the ordering physician updated.

## 2017-07-01 NOTE — PACU (Signed)
1912 Family updated.

## 2017-07-01 NOTE — Anesthesia Postprocedure Evaluation (Addendum)
Anesthesia Post Evaluation    Patient: Christopher Trevino    Procedure(s):  INCISION & DRAINAGE, RETROPHARYNGEAL ABSCESS    Anesthesia type: general    Last Vitals:   Vitals:    07/01/17 1233   BP: 129/76   Pulse: 62   Resp: 18   Temp: 37.4 C (99.4 F)   SpO2: 96%       Anesthesia Post Evaluation    Patient location during evaluation: PACU  Patient participation: complete - patient participated  Level of consciousness: awake and alert  Pain score: 0  Pain management: adequate  Airway patency: patent  Anesthetic complications: no  Cardiovascular status: acceptable  Respiratory status: acceptable  Hydration status: acceptable        Anesthesia Qualified Clinical Data Registry 2018    PACU Reintubation  Did the Patient have general anesthesia with intubation: Yes  Did the Patient require reintubation in the PACU?: No  Was this a planned exubation trial (documented in the medical record)?: No    PONV Adult  Is the patient aged 47 or older: Yes  Did the patient receive recieve a general anesthestic: Yes  Does the patient have 3 or more risk factors for PONV? No  Did the patient receive anti-emetics from at least two classes of medications? Yes      PONV Pediatric  Is the patient aged 42-17? No            PACU Transfer Checklist Protocol  Was the patient transferred to the PACU at the conclusion of surgery? Yes  Was a checklist or transfer protocol used? Yes    ICU Transfer Checklist Protocol  Was the patient transferred to the ICU at the conclusion of surgery? No      Post-op Pain Assessment Prior to Anesthesia Care End  Age >=18 and assessed for pain in PACU: Yes  Pacu pain score <7/10: Yes      Perioperative Mortality  Perioperative mortality prior to Anesthesia end time: No    Perioperative Cardiac Arrest  Did the patient have an unanticipated intraoperative cardiac arrest between anesthesia start time and anesthesia end time? No    Unplanned Admission to ICU  Did the patient have an unplanned admission to the ICU (not  initially anticipated at anesthesia start time)? No      Signed by: Nunzio Cobbs, 07/01/2017 6:57 PM

## 2017-07-01 NOTE — Transfer of Care (Signed)
Anesthesia Transfer of Care Note    Patient: Christopher Trevino    Procedures performed: Procedure(s):  INCISION & DRAINAGE, RETROPHARYNGEAL ABSCESS    Anesthesia type: General ETT    Patient location:Phase I PACU    Last vitals:   Vitals:    07/01/17 1233   BP: 129/76   Pulse: 62   Resp: 18   Temp: 37.4 C (99.4 F)   SpO2: 96%       Post pain: Patient not complaining of pain, continue current therapy      Mental Status:sedated    Respiratory Function: tolerating room air    Cardiovascular: stable    Nausea/Vomiting: patient not complaining of nausea or vomiting    Hydration Status: adequate    Post assessment: no apparent anesthetic complications    Signed by: Nunzio Cobbs  07/01/17 6:57 PM

## 2017-07-01 NOTE — Plan of Care (Signed)
Pt awake and alert, vss, chlorhexidine bath given and pt ready. Report given to pre-op RN, clindamycin dose due at 1830 and dexamethasone 8 mg iv due at 1800, these medications were sent down with the pt to be given by pre-op nurse when due as requested. Pt transported down in bed by the transport tech accompanied by the wife.

## 2017-07-01 NOTE — Anesthesia Preprocedure Evaluation (Signed)
Anesthesia Evaluation    AIRWAY    Mallampati: II    TM distance: >3 FB  Neck ROM: limited  Mouth Opening:full   CARDIOVASCULAR    regular and normal       DENTAL         PULMONARY    clear to auscultation     OTHER FINDINGS                  Relevant Problems   No relevant active problems               Anesthesia Plan    ASA 2     general                     intravenous induction   Detailed anesthesia plan: general endotracheal            informed consent obtained    Plan discussed with CRNA.      pertinent labs reviewed             Signed by: Manus Rudd 07/01/17 5:35 PM

## 2017-07-01 NOTE — Plan of Care (Signed)
Pt awake and alert, vss, complete assessment done and charted. Plan of care reviewed with the pt. She verbalized understanding and denied any question.

## 2017-07-01 NOTE — Progress Notes (Signed)
Pt arrived awake and oriented x4 from the OR on bed. Pt oriented to the plan of care for the evening. Wife is at bedside. Pt vital signs stable and within normal limits. Pt rating pain at a 1 out of 10 in the throat. No active bleeding of the throat. Pt concerned of lump on tongue which just appears to look like a bite on his tongue. Pt informed to call RN if notices any change in tightness of chest/throat or having any difficulty breathing. Pt resting in bed.

## 2017-07-01 NOTE — UM Notes (Signed)
Additional notes 1/3    Reason for consult:     Tonsillitis, peritonsillar abscess     Acute tonsillitis   Peritonsillar abscess   Depression   Migraines    Recommendations:     I would like to suggest following approach:     Rocephin 2 g IV q. Daily   Clindamycin 900 mg IV q.8 hours   Correction of electrolytes   ENT follow-up   We'll follow cultures   Consider repeat CT scan if pain persists   Blood cultures if spikes more than 100.5    We'll adjust the antimicrobials according to the cultures and clinical course    MED NOTES:  Assessment and Plan:    Patient is having sore throat from last 4 days.  He is also complaining of left ear pain, difficulty swallowing and painful swallowing much better despite he is on IV clindamycin, started on IV Rocephin.  He is on Decadron.  Patient is seen by ENT surgeon .  There is a tiny collection involving the.  Positive soft tissue inferior on the left -abscess.  Patient is evaluated and started on diet.  Continue with medication.  Keep him nothing by mouth from midnight.  Further plan and recommendation per A and P surgeon.   Severe pain not controlled with 2 mg of IV morphine I will start him on IV Dilaudid 1 mg every 4 hour as needed

## 2017-07-02 DIAGNOSIS — M542 Cervicalgia: Secondary | ICD-10-CM

## 2017-07-02 LAB — GFR: EGFR: >60

## 2017-07-02 LAB — CBC
Absolute NRBC: 0 10*3/uL
Hematocrit: 42 % (ref 42.0–52.0)
Hgb: 14.9 g/dL (ref 13.0–17.0)
MCH: 31.3 pg (ref 28.0–32.0)
MCHC: 35.5 g/dL (ref 32.0–36.0)
MCV: 88.2 fL (ref 80.0–100.0)
MPV: 10.4 fL (ref 9.4–12.3)
Nucleated RBC: 0 /100 WBC (ref 0.0–1.0)
Platelets: 207 10*3/uL (ref 140–400)
RBC: 4.76 10*6/uL (ref 4.70–6.00)
RDW: 11 % — ABNORMAL LOW (ref 12–15)
WBC: 11.92 10*3/uL — ABNORMAL HIGH (ref 3.50–10.80)

## 2017-07-02 LAB — BASIC METABOLIC PANEL
Anion Gap: 10 (ref 5.0–15.0)
BUN: 12.4 mg/dL (ref 9.0–28.0)
CO2: 24 mEq/L (ref 22–29)
Calcium: 9 mg/dL (ref 8.5–10.5)
Chloride: 104 mEq/L (ref 100–111)
Creatinine: 1 mg/dL (ref 0.7–1.3)
Glucose: 121 mg/dL — ABNORMAL HIGH (ref 70–100)
Potassium: 4.2 mEq/L (ref 3.5–5.1)
Sodium: 138 mEq/L (ref 136–145)

## 2017-07-02 MED ORDER — RISAQUAD PO CAPS
1.00 | ORAL_CAPSULE | Freq: Every day | ORAL | 0 refills | Status: DC
Start: 2017-07-03 — End: 2017-08-17

## 2017-07-02 MED ORDER — PREDNISONE 10 MG PO TABS
ORAL_TABLET | ORAL | 0 refills | Status: DC
Start: 2017-07-02 — End: 2017-08-17

## 2017-07-02 MED ORDER — POLYETHYLENE GLYCOL 3350 17 G PO PACK
17.00 g | PACK | Freq: Two times a day (BID) | ORAL | 0 refills | Status: DC | PRN
Start: 2017-07-02 — End: 2017-08-17

## 2017-07-02 MED ORDER — CLINDAMYCIN HCL 150 MG PO CAPS
300.00 mg | ORAL_CAPSULE | Freq: Four times a day (QID) | ORAL | 0 refills | Status: AC
Start: 2017-07-02 — End: 2017-07-09

## 2017-07-02 MED ORDER — OXYCODONE-ACETAMINOPHEN 5-325 MG PO TABS
1.00 | ORAL_TABLET | Freq: Four times a day (QID) | ORAL | 0 refills | Status: DC | PRN
Start: 2017-07-02 — End: 2017-08-17

## 2017-07-02 MED ORDER — CEFUROXIME AXETIL 250 MG PO TABS
250.00 mg | ORAL_TABLET | Freq: Two times a day (BID) | ORAL | 0 refills | Status: DC
Start: 2017-07-02 — End: 2017-08-17

## 2017-07-02 MED ORDER — FAMOTIDINE 20 MG PO TABS
20.00 mg | ORAL_TABLET | Freq: Two times a day (BID) | ORAL | 0 refills | Status: DC
Start: 2017-07-02 — End: 2017-08-17

## 2017-07-02 NOTE — Discharge Instr - AVS First Page (Signed)
Reason for your Hospital Admission:  tonsillitis and peritonsillar abscess       Instructions for after your discharge:  Please call your ENT doctor in 3 days to make sure you are on right antibiotics. Please follow with ENT doctor in 7-10 days.

## 2017-07-02 NOTE — Discharge Summary (Signed)
[facility] HOSPITALIST   [facility] Summary   Patient Info:   Date/Time: 07/02/2017 / 1:27 PM   Admit Date:06/29/2017  Patient Name:Christopher Trevino   ZOX:[id number]   PCP: Daisey Must, DO  Attending Physician:Beverlee Wilmarth, Drucilla Schmidt, MD     Hospital Course:   Please see H&P for complete details of HPI and ROS. The patient was admitted to [facility] and has been taken care as mentioned below.     Patient is having sore throat from last 6 days.  He is also complaining of left ear pain, difficulty swallowing and painful swallowing much better now, he is on IV clindamycin and  on IV Rocephin.  He is on Decadron as well.  Patient is seen by ENT surgeon .  There is a tiny collection involving the soft tissue inferior on the left which measure up to 1 cm suspicious for a tiny abscess in the setting.  Patient is evaluated and started on diet.  Continue with medication.  repeat ct scan did not show much improvement. S/P I+D of left pharyngeal abscess Further plan and recommendation per ENT surgeon out patient. ENT called to discharge him on prednisone tapering, ceftin, and clindamycin. I called I.D he agrees and I agree with the plan.    Severe pain resolved will d/c on percocet.  Reason for your Hospital Admission:  tonsillitis and peritonsillar abscess       Instructions for after your discharge:  Please call your ENT doctor in 3 days to make sure you are on right antibiotics. Please follow with ENT doctor in 7-10 days.  Disposition:home  Condition at Discharge and Prognosis: stable  Admission Date:06/29/2017  Discharge Date: 07/02/17  Type of Admission:Inpatient   Code Status: Full Code  Subjective at the time of discharge:   stable  Chief Complaint:  Abscess and Oral Swelling    Objective:     Vitals:    07/02/17 0000 07/02/17 0445 07/02/17 0754 07/02/17 1115   BP:  125/68 136/84 123/73   Pulse: 71 82 79 69   Resp: 18 16 18 18    Temp:  98.5 F (36.9 C) 98.1 F (36.7 C) 97.9 F (36.6 C)   TempSrc:  Temporal Artery  Temporal Artery Temporal Artery   SpO2:  96% 97% 97%   Weight:       Height:         Physical Exam:   Lungs clear, soarthorat improved no discharge today.    Clinical Presentation:   History of Presenting Illness: Please refer to HPI in the Detailed H&P  Discharge Medications:   Discharge Medications:      Discharge Medication List      Taking    cefuroxime 250 MG tablet  Dose:  250 mg  Commonly known as:  CEFTIN  Take 1 tablet (250 mg total) by mouth 2 (two) times daily.     clindamycin 150 MG capsule  Dose:  300 mg  Commonly known as:  CLEOCIN  Take 2 capsules (300 mg total) by mouth 4 (four) times daily.for 7 days     famotidine 20 MG tablet  Dose:  20 mg  Commonly known as:  PEPCID  Take 1 tablet (20 mg total) by mouth every 12 (twelve) hours.     lactobacillus/streptococcus Caps  Dose:  1 capsule  Start taking on:  07/03/2017  Take 1 capsule by mouth daily.     oxyCODONE-acetaminophen 5-325 MG per tablet  Dose:  1 tablet  Commonly known as:  PERCOCET  Take 1 tablet by mouth every 6 (six) hours as needed for Pain.     polyethylene glycol packet  Dose:  17 g  Commonly known as:  MIRALAX  Take 17 g by mouth 2 (two) times daily as needed (constipation).     predniSONE 10 MG tablet  Commonly known as:  DELTASONE  Take 3 tablets for 2 days then 2 tablets for 2 days then 1 tablet for 2 days     SUMAtriptan 100 MG tablet  Dose:  100 mg  Commonly known as:  IMITREX  Take 1 tablet (100 mg total) by mouth every 2 (two) hours as needed for Migraine.for up to 9 doses May repeat one time in 24 hours.        STOP taking these medications    methylPREDNISolone 4 MG tablet  Commonly known as:  MEDROL DOSPACK          Follow up recommendations:   Follow up:   Follow-up Information     Daisey Must, DO Follow up.    Specialty:  Family Medicine  Contact information:  [address] [facility]  [address]  [place] [address]  [phone]             Francis Gaines, MD. Schedule an appointment as soon as possible for a visit in 7  day(s).    Specialty:  Otorhinolaryngology  Contact information:  [address]  [address]  [place] [address]  [phone]                  Results of Labs/imaging:   Labs have been reviewed:   Coagulation Profile:       CBC review:   Recent Labs  Lab 07/02/17  0518 07/01/17  0533 06/30/17  0531 06/29/17  1649 06/28/17  1007   WBC 11.92* 6.75 6.70 8.23 8.77   Hgb 14.9 13.5 13.3 15.0 15.9   Hematocrit 42.0 38.5* 38.4* 42.9 46.4   Platelets 207 177 175 198 168   MCV 88.2 90.2 89.7 90.3 89.7   RDW 11* 11* 11* 11* 11*   Neutrophils  --  55.7  --  80.5 84.2   Lymphocytes Automated  --  28.6  --  9.8 8.2   Eosinophils Automated  --  0.3  --  0.1 0.5   Immature Granulocyte  --  0.3  --  0.4 0.3   Neutrophils Absolute  --  3.76  --  6.62 7.38   Absolute Immature Granulocyte  --  0.02  --  0.03 0.03     Chem Review:  Recent Labs  Lab 07/02/17  0518 07/01/17  0533 06/30/17  0531 06/29/17  1649 06/28/17  1007   Sodium 138 139 140 141 141   Potassium 4.2 3.9 4.4 4.2 4.1   Chloride 104 105 107 105 108   CO2 24 26 25 26 22    BUN 12.4 9.1 8.4* 8.4* 8.5*   Creatinine 1.0 0.9 0.9 0.9 0.9   Glucose 121* 89 101* 112* 107*   Calcium 9.0 9.0 8.8 9.8 9.6   Magnesium  --   --  2.0  --   --    Phosphorus  --   --  4.3  --   --    Bilirubin, Total  --  0.6  --  0.6  --    AST (SGOT)  --  10  --  13  --    ALT  --  12  --  14  --    Alkaline Phosphatase  --  62  --  76  --      Results     Procedure Component Value Units Date/Time    GFR [[id number]] Collected:  07/02/17 0518     Updated:  07/02/17 0543     EGFR >60.0    Basic Metabolic Panel [[id number]]  (Abnormal) Collected:  07/02/17 0518    Specimen:  Blood Updated:  07/02/17 0543     Glucose 121 (H) mg/dL      BUN 78.2 mg/dL      Creatinine 1.0 mg/dL      Calcium 9.0 mg/dL      Sodium 956 mEq/L      Potassium 4.2 mEq/L      Chloride 104 mEq/L      CO2 24 mEq/L      Anion Gap 10.0    CBC without differential [[id number]]  (Abnormal) Collected:  07/02/17 0518    Specimen:  Blood from Blood  Updated:  07/02/17 0523     WBC 11.92 (H) x10 3/uL      Hgb 14.9 g/dL      Hematocrit 46.9 %      Platelets 207 x10 3/uL      RBC 4.76 x10 6/uL      MCV 88.2 fL      MCH 31.3 pg      MCHC 35.5 g/dL      RDW 11 (L) %      MPV 10.4 fL      Nucleated RBC 0.0 /100 WBC      Absolute NRBC 0.00 x10 3/uL     Culture + Gram Stain,Aerobic, Wound [[id number]] Collected:  07/01/17 1830    Specimen:  Wound from Abscess Updated:  07/02/17 0122    Narrative:       ORDER#: [id number]                                    ORDERED BY: Rondel Jumbo, ROY  SOURCE: Abscess LEFT PHARYNGEAL ABSCESS              COLLECTED:  07/01/17 18:30  ANTIBIOTICS AT COLL.:                                RECEIVED :  07/01/17 21:40  Stain, Gram                                FINAL       07/02/17 01:22  07/02/17   Rare WBCs             No organisms seen  Culture and Gram Stain, Aerobic, Wound     PENDING      Anaerobic culture [[id number]] Collected:  07/01/17 1830    Specimen:  Other from Abscess Updated:  07/01/17 2140        Radiology reports have been reviewed:  Radiology Results (24 Hour)     Procedure Component Value Units Date/Time    CT Soft Tissue Neck W Contrast [[id number]] Collected:  07/01/17 1405    Order Status:  Completed Updated:  07/01/17 1426    Narrative:       HISTORY: Pharyngitis, epiglottitis with a small left lateral pharyngeal  abscess. Persistent sore throat.    COMPARISON:  Comparison is made to CT of the neck dated 06/28/2017.    TECHNIQUE: Serial axial images were obtained through the neck after 100  mL of Omnipaque 350 intravenous contrast without untoward effect.   Coronaland sagittal reconstructed MPR images are reviewed. The  following dose reduction techniques were utilized: automated exposure  control and/or adjustment of the mA and/or kV according to patient's  size, and the use of iterative reconstruction technique.    FINDINGS: There is extensive artifact related to dental amalgam  obscuring evaluation of the oral cavity and  oropharynx. There remains  asymmetric enlargement of the left lateral pharyngeal wall with a hazy  appearance of the fat within the parapharyngeal space as well as  enlargement of the left palatine tonsils. This finding extends  superiorly with asymmetric enlargement involving the left aspect of the  nasopharynx and extends inferiorly to the level of the epiglottis. The  epiglottis and glossoepiglottic fold remains slightly enlarged and there  is partial opacification of the left vallecula which may be due to  enlarged lingual tonsils. There is a slight interval reduction in size  of the left peritonsillar collection which now measures 9 x 8 mm,  previously measuring 11 x 10 mm, by my measurements.     There is also subtle medial deviation of the left vocal cord.  There is  no dilatation of the ipsilateral piriform sinus nor is there medial  rotation or thickening of the aryepiglottic fold.  Again, no discrete  mass lesion is identified.    There is no pathologic lymphadenopathy. The airways are maintained. The  parotid, submandibular, and thyroid glands are within normal limits. The  major vascular structures enhance normally.     The visualized brain and orbits are within normal limits. The visualized  paranasal sinuses and mastoid air cells are well-pneumatized and clear.  No suspicious osseous lesions are detected. The visualized lung apices  are clear.        Impression:          1. Stable to slightly improved appearance of the left-sided tonsillitis  and pharyngitis with a small left lateral pharyngeal abscess. A mildly  enlarged and edematous appearance to the epiglottis persists.  2.  Subtle deviation of the left vocal cord medially. Direct  visualization is recommended for further evaluation.    Neldon Mc, MD   07/01/2017 2:22 PM        Ct Soft Tissue Neck W Contrast    Result Date: 07/01/2017  HISTORY: Pharyngitis, epiglottitis with a small left lateral pharyngeal abscess. Persistent sore throat.  COMPARISON: Comparison is made to CT of the neck dated 06/28/2017. TECHNIQUE: Serial axial images were obtained through the neck after 100 mL of Omnipaque 350 intravenous contrast without untoward effect.  Coronaland sagittal reconstructed MPR images are reviewed. The following dose reduction techniques were utilized: automated exposure control and/or adjustment of the mA and/or kV according to patient's size, and the use of iterative reconstruction technique. FINDINGS: There is extensive artifact related to dental amalgam obscuring evaluation of the oral cavity and oropharynx. There remains asymmetric enlargement of the left lateral pharyngeal wall with a hazy appearance of the fat within the parapharyngeal space as well as enlargement of the left palatine tonsils. This finding extends superiorly with asymmetric enlargement involving the left aspect of the nasopharynx and extends inferiorly to the level of the epiglottis. The epiglottis and glossoepiglottic fold remains slightly enlarged and there is partial opacification of the left vallecula which may be  due to enlarged lingual tonsils. There is a slight interval reduction in size of the left peritonsillar collection which now measures 9 x 8 mm, previously measuring 11 x 10 mm, by my measurements. There is also subtle medial deviation of the left vocal cord.  There is no dilatation of the ipsilateral piriform sinus nor is there medial rotation or thickening of the aryepiglottic fold.  Again, no discrete mass lesion is identified. There is no pathologic lymphadenopathy. The airways are maintained. The parotid, submandibular, and thyroid glands are within normal limits. The major vascular structures enhance normally. The visualized brain and orbits are within normal limits. The visualized paranasal sinuses and mastoid air cells are well-pneumatized and clear. No suspicious osseous lesions are detected. The visualized lung apices are clear.      1. Stable to  slightly improved appearance of the left-sided tonsillitis and pharyngitis with a small left lateral pharyngeal abscess. A mildly enlarged and edematous appearance to the epiglottis persists. 2.  Subtle deviation of the left vocal cord medially. Direct visualization is recommended for further evaluation. Neldon Mc, MD 07/01/2017 2:22 PM    Ct Soft Tissue Neck With Contrast    Result Date: 06/28/2017  HISTORY: Left-sided throat pain and swelling TECHNIQUE: Axial CT images of the neck were obtained following the intravenous injection of 100 cc Omnipaque 350. Coronal and sagittal reconstructions were also obtained. The following dose reduction techniques were utilized: automated exposure control and/or adjustment of the mA and/or kV according to patient size, and the use of iterative reconstruction technique. FINDINGS: Evaluation at the level the oral cavity is limited by metallic streak artifact. The palatine tonsils are prominent in size and demonstrates scattered coarse calcifications. There is asymmetric enlargement and heterogeneous enhancement of the left palatine tonsil, suggesting acute tonsillitis. There is a tiny well marginated collection involving the peritonsillar soft tissues inferiorly on the left that measures approximately 10 x 6 mm transversely which could reflect a tiny abscess in this setting. There is no significant inflammatory stranding of the parapharyngeal fat. The epiglottis appears minimally edematous. There is asymmetric soft tissue fullness involving left aspect of the nasopharynx which is presumably inflammatory. No discrete mass lesion is identified to suggest a neoplastic process. There is no mastoid effusion on the left. The glottic and subglottic structures are unremarkable. The airway is patent. There are asymmetrically enlarged cervical lymph nodes on the left. A level II lymph node measures up to 2.0 x 1.1 cm transversely. The major salivary glands are unremarkable. No thyroid  lesion is identified. The osseous structures are unremarkable. The lung apices are clear.      1. Findings consistent with acute tonsillitis, with asymmetric enlargement and heterogeneous enhancement of the left palatine tonsil. There is a tiny collection involving the peritonsillar soft tissues inferiorly on the left which measures up to 1 cm, suspicious for a tiny abscess in this setting. 2. There is asymmetric soft tissue fullness involving the nasopharynx on the left which likely reflects associated inflammatory change. No discrete mass lesion is identified. Clinical correlation is recommended. 3. Asymmetrically enlarged cervical lymph nodes on the left are likely reactive. Nicoletta Dress, MD 06/28/2017 11:46 AM    Pathology:   Specimens     None        Pending Lab Results:   Labs/Images to be followed at your PCP office: Unresulted Labs     Procedure . . . Date/Time    Anaerobic culture [[id number]] Collected:  07/01/17 1830  Specimen:  Other from Abscess Updated:  07/01/17 2140        Hospitalist:   Signed by: Zada Finders  07/02/2017 1:27 PM  Time spent for discharge: 35 minutes      *This note was generated by the Epic EMR system/ Dragon speech recognition and may contain inherent errors or omissions not intended by the user. Grammatical errors, random word insertions, deletions, pronoun errors and incomplete sentences are occasional consequences of this technology due to software limitations. Not all errors are caught or corrected. If there are questions or concerns about the content of this note or information contained within the body of this dictation they should be addressed directly with the author for clarification

## 2017-07-02 NOTE — Plan of Care (Addendum)
Problem: Pain  Goal: Pain at adequate level as identified by patient  Outcome: Progressing   07/02/17 0339   Goal/Interventions addressed this shift   Pain at adequate level as identified by patient Identify patient comfort function goal;Assess for risk of opioid induced respiratory depression, including snoring/sleep apnea. Alert healthcare team of risk factors identified.;Assess pain on admission, during daily assessment and/or before any "as needed" intervention(s);Reassess pain within 30-60 minutes of any procedure/intervention, per Pain Assessment, Intervention, Reassessment (AIR) Cycle;Evaluate if patient comfort function goal is met;Include patient/patient care companion in decisions related to pain management as needed     Pt doing excellent this shift. Was able to tolerate soft food diet well and had no discomfort while doing so. Pt did experience pain around 2346 and requested pain medication. Pt rated pain a 7 out of 10 in the throat. Pt received the DILAUDID at 2346 nd was able to sleep for the rest of the evening. At [address]10 pt reported mild pain of 2 out of 10 and requested TORADOL which was given. At Pt active and walks in room. Pt intake and output adequate.

## 2017-07-03 NOTE — Op Note (Signed)
Procedure Date: 07/01/2017     Patient Type: I     SURGEON: Francis Gaines MD  ASSISTANT: N/A       PREOPERATIVE DIAGNOSES: Pharyngeal abscess.       POSTOPERATIVE DIAGNOSES: Pharyngeal abscess.       TITLE OF PROCEDURE:  Incision and drainage of a small left pharyngeal abscess.     ANESTHESIA:  GETA.     ESTIMATED BLOOD LOSS:  Less than 5 mL.     INTRAVENOUS FLUIDS:  As per anesthesia records.     FINDINGS:  Asymmetric tonsils with a significantly enlarged and infected tonsil on the left side with a tiny left lateral pharyngeal abscess.  There was no evidence of airway compromise.     DESCRIPTION OF PROCEDURE:  The patient was appropriately identified based on operating room protocol.   He was placed in supine position.  General anesthesia was induced without  incident.  Head of bed was turned 90 degrees.  A shoulder roll was placed  for gentle neck extension.  Two blue towels were used to wrap the head.  A  U drape was placed to complete the draping process.  A Crowe-Davis  retractor was introduced and the oropharynx was brought into view.  Using  an 18-gauge needle and 10 mL syringe, a tiny bulge along the left side of  the pharyngeal wall was entered.  Purulent material less than 1 mL was  extracted.  That same area was incised and the abscess pocket was opened.  The area was thoroughly irrigated with triple antibiotic rinse.   An orogastric tube was passed and stomach content was suctioned clear.   Pressure application was performed at the I+D site for several minutes.   Hemostasis was assured.  The Crowe-Davis mouth retractor was removed and  the patient was turned back to anesthesia.  Anesthesia was reversed without  incident.           D:  07/02/2017 18:47 PM by Dr. Francis Gaines, MD ([id number])  T:  07/03/2017 04:29 AM by NTS      Everlean Cherry: [id number]) (Doc ID: [id number])

## 2017-07-04 ENCOUNTER — Encounter: Payer: Self-pay | Admitting: Otolaryngology

## 2017-07-18 ENCOUNTER — Ambulatory Visit (INDEPENDENT_AMBULATORY_CARE_PROVIDER_SITE_OTHER): Payer: No Typology Code available for payment source | Admitting: Family Medicine

## 2017-07-18 ENCOUNTER — Encounter (INDEPENDENT_AMBULATORY_CARE_PROVIDER_SITE_OTHER): Payer: Self-pay | Admitting: Family Medicine

## 2017-07-18 VITALS — BP 106/73 | HR 99 | Temp 98.2°F | Wt 147.6 lb

## 2017-07-18 DIAGNOSIS — Z8709 Personal history of other diseases of the respiratory system: Secondary | ICD-10-CM

## 2017-07-18 NOTE — Progress Notes (Signed)
Subjective:      Patient ID: Christopher Trevino  is a 42 y.o.  male.     Christopher Trevino is a 43 y.o. male presenting for   Chief Complaint   Patient presents with   . Sore Throat     per patient since after hospital visit pt is having pain only in the left side with ear pain and sensitivity around the ear left side   . Anorexia     per patient since then loss of appetite and lower temps. around 96.2    . Spasms     per pt started over the weekend noticed only on left muscle spams       HPI    S/p I&D peritonsilar abscess and tonsilitis  He felt tired after the surgery and it took longer than he thought to recover.  His appetite decreased, he felt down after taking percocet.  He feels tired throughout the day.      Stopped taking narcotics 1.5 weeks ago.  He is taking ibuprofen for pain now as needed, the pain is tolerable without the ibuprofen but the left ear pain bothers him at night and keeps him from sleeping.      The following portions of the patient's history were reviewed and updated as appropriate: current medications, allergies, past family history, past medical history, past social history, past surgical history and problem list.     Review of Systems   Constitutional: Negative for chills, diaphoresis and fever.   Eyes: Negative for visual disturbance.   Respiratory: Negative for cough and shortness of breath.    Cardiovascular: Negative for chest pain and palpitations.   Gastrointestinal: Negative for abdominal pain and blood in stool.   Genitourinary: Negative for dysuria and hematuria.   Musculoskeletal: Negative for arthralgias and myalgias.   Skin: Negative for color change.   Neurological: Negative for dizziness, syncope, light-headedness and headaches.   Psychiatric/Behavioral: Negative for confusion and dysphoric mood.         BP 106/73 (BP Site: Left arm)   Pulse 99   Temp 98.2 F (36.8 C) (Oral)   Wt 67 kg (147 lb 9.6 oz)   SpO2 97%   BMI 23.11 kg/m     Objective:     Physical Exam    Constitutional: He is oriented to person, place, and time. He appears well-developed and well-nourished. No distress.   HENT:   Head: Normocephalic and atraumatic.   Right Ear: External ear normal.   Left Ear: External ear normal.   Nose: Nose normal.   Mouth/Throat: Oropharynx is clear and moist.   Eyes: Pupils are equal, round, and reactive to light. Conjunctivae and EOM are normal.   Neck: Normal range of motion. Neck supple.   Cardiovascular: Normal rate, regular rhythm, normal heart sounds and intact distal pulses.    Pulmonary/Chest: Effort normal and breath sounds normal. No respiratory distress. He has no wheezes. He has no rales.   Abdominal: Soft. Bowel sounds are normal. There is no tenderness.   Musculoskeletal: Normal range of motion.   Neurological: He is alert and oriented to person, place, and time.   Skin: Skin is warm and dry. He is not diaphoretic.   Psychiatric: He has a normal mood and affect.   Nursing note and vitals reviewed.        Assessment/Plan:     1. History of peritonsillar abscess  Acute, stable  -Follow up with ENT  -NSAIDs prn for pain  -  Symptomatic care  -ER precautions    Risk & Benefits of the new medication(s) were explained to the patient (and family) who verbalized understanding & agreed to the treatment plan. Patient (family) encouraged to contact me/clinical staff with any questions/concerns.    Call if you develop any new or worsening symptoms  Proceed to urgent care or ED after hours if necessary    Daisey Must, D.O.      Return if symptoms worsen or fail to improve.

## 2017-07-19 ENCOUNTER — Encounter (INDEPENDENT_AMBULATORY_CARE_PROVIDER_SITE_OTHER): Payer: Self-pay | Admitting: Family Medicine

## 2017-08-12 ENCOUNTER — Encounter (INDEPENDENT_AMBULATORY_CARE_PROVIDER_SITE_OTHER): Payer: Self-pay | Admitting: Family Medicine

## 2017-08-15 ENCOUNTER — Encounter: Payer: Self-pay | Admitting: Otolaryngology

## 2017-08-16 ENCOUNTER — Encounter: Payer: Self-pay | Admitting: Otolaryngology

## 2017-08-16 ENCOUNTER — Encounter (INDEPENDENT_AMBULATORY_CARE_PROVIDER_SITE_OTHER): Payer: Self-pay | Admitting: Family Medicine

## 2017-08-16 ENCOUNTER — Other Ambulatory Visit: Payer: Self-pay | Admitting: Family Medicine

## 2017-08-16 ENCOUNTER — Ambulatory Visit (INDEPENDENT_AMBULATORY_CARE_PROVIDER_SITE_OTHER): Payer: No Typology Code available for payment source | Admitting: Family Medicine

## 2017-08-16 VITALS — BP 131/85 | HR 75 | Temp 98.0°F | Resp 16 | Ht 66.5 in | Wt 148.0 lb

## 2017-08-16 DIAGNOSIS — I517 Cardiomegaly: Secondary | ICD-10-CM

## 2017-08-16 DIAGNOSIS — Z01818 Encounter for other preprocedural examination: Secondary | ICD-10-CM

## 2017-08-16 LAB — BASIC METABOLIC PANEL
BUN: 16 mg/dL (ref 9.0–28.0)
CO2: 26 mEq/L (ref 21–29)
Calcium: 9.4 mg/dL (ref 8.5–10.5)
Chloride: 105 mEq/L (ref 100–111)
Creatinine: 0.8 mg/dL (ref 0.5–1.5)
Glucose: 87 mg/dL (ref 70–100)
Potassium: 3.9 mEq/L (ref 3.5–5.1)
Sodium: 141 mEq/L (ref 136–145)

## 2017-08-16 LAB — CBC
Absolute NRBC: 0 10*3/uL
Hematocrit: 42.8 % (ref 42.0–52.0)
Hgb: 14.9 g/dL (ref 13.0–17.0)
MCH: 31.5 pg (ref 28.0–32.0)
MCHC: 34.8 g/dL (ref 32.0–36.0)
MCV: 90.5 fL (ref 80.0–100.0)
MPV: 11.5 fL (ref 9.4–12.3)
Nucleated RBC: 0 /100 WBC (ref 0.0–1.0)
Platelets: 218 10*3/uL (ref 140–400)
RBC: 4.73 10*6/uL (ref 4.70–6.00)
RDW: 11 % — ABNORMAL LOW (ref 12–15)
WBC: 5.21 10*3/uL (ref 3.50–10.80)

## 2017-08-16 LAB — HEMOLYSIS INDEX: Hemolysis Index: 8 (ref 0–18)

## 2017-08-16 LAB — GFR: EGFR: >60

## 2017-08-16 NOTE — Progress Notes (Addendum)
Subjective:      Date: 08/16/2017 3:13 PM   Patient ID: [place] A Zemanek is a 42 y.o. male.    Chief Complaint   Patient presents with   . Pre-op Exam     not fasting       Jonty A Dinneen  presents to the office today for a preoperative consultation at the request of surgeon (Dr. .Roya Azadamarki) who plans on performing a tonsillectomy on March 6.   Patient has a history of Peritonsillar abscess, enlarged tonsils.    Planned anesthesia: general.   The patient has the following known anesthesia issues: none.   Patient bleeding risk: no recent or remote history of abnormal bleeding.   Patient does not have objections to receiving blood products if needed.    The surgeon has requested the following labs/studies to be performed: CBC, BMP, EKG     Patient Active Problem List   Diagnosis   . Peritonsillar abscess   . Failure of outpatient treatment   . Hyperglycemia   . History of migraine       Current Outpatient Prescriptions   Medication Sig Dispense Refill   . lansoprazole (PREVACID) 15 MG capsule Take 15 mg by mouth daily.     . cefuroxime (CEFTIN) 250 MG tablet Take 1 tablet (250 mg total) by mouth 2 (two) times daily. 14 tablet 0   . famotidine (PEPCID) 20 MG tablet Take 1 tablet (20 mg total) by mouth every 12 (twelve) hours. 20 tablet 0   . lactobacillus/streptococcus (RISAQUAD) Cap Take 1 capsule by mouth daily. 10 capsule 0   . oxyCODONE-acetaminophen (PERCOCET) 5-325 MG per tablet Take 1 tablet by mouth every 6 (six) hours as needed for Pain. 15 tablet 0   . polyethylene glycol (MIRALAX) packet Take 17 g by mouth 2 (two) times daily as needed (constipation). 30 packet 0   . predniSONE (DELTASONE) 10 MG tablet Take 3 tablets for 2 days then 2 tablets for 2 days then 1 tablet for 2 days 12 tablet 0   . SUMAtriptan (IMITREX) 100 MG tablet Take 1 tablet (100 mg total) by mouth every 2 (two) hours as needed for Migraine.for up to 9 doses May repeat one time in 24 hours. 9 tablet 5     No current  facility-administered medications for this visit.        No Known Allergies    Past Medical History:   Diagnosis Date   . Depression screening negative 02-04-12    Neg   . History of migraine        Past Surgical History:   Procedure Laterality Date   . INCISION & DRAINAGE, RETROPHARYNGEAL ABSCESS Left 07/01/2017    Procedure: INCISION & DRAINAGE, RETROPHARYNGEAL ABSCESS;  Surgeon: Azadarmaki, Roya, MD;  Location: [facility];  Service: ENT;  Laterality: Left;       Family History   Problem Relation Age of Onset   . Hyperlipidemia Mother    . Hypertension Mother    . Hyperlipidemia Father    . Hypertension Father        Social History     Social History   . Marital status: Married     Spouse name: N/A   . Number of children: N/A   . Years of education: N/A     Occupational History   . Not on file.     Social History Main Topics   . Smoking status: Former Smoker     Packs/day: 0.50       Years: 3.00     Quit date: 05/10/1999   . Smokeless tobacco: Never Used   . Alcohol use 3.6 oz/week     6 Cans of beer per week   . Drug use: No   . Sexual activity: Not on file     Other Topics Concern   . Not on file     Social History Narrative   . No narrative on file       The following portions of the patient's history were reviewed and updated as appropriate: allergies, current medications, past family history, past medical history, past social history, past surgical history and problem list.    Review of Systems   Constitutional: Negative for chills, diaphoresis and fever.   HENT: Positive for sore throat.    Eyes: Negative for visual disturbance.   Respiratory: Negative for cough and shortness of breath.    Cardiovascular: Negative for chest pain and palpitations.   Gastrointestinal: Negative for abdominal pain and blood in stool.   Genitourinary: Negative for dysuria and hematuria.   Musculoskeletal: Negative for arthralgias and myalgias.   Skin: Negative for color change.   Neurological: Negative for dizziness, syncope,  light-headedness and headaches.   Psychiatric/Behavioral: Negative for confusion and dysphoric mood.        Objective:     BP 131/85   Pulse 75   Temp 98 F (36.7 C) (Oral)   Resp 16   Ht 1.689 m (5' 6.5")   Wt 67.1 kg (148 lb)   SpO2 99%   BMI 23.53 kg/m     Physical Exam   Constitutional: He is oriented to person, place, and time. He appears well-developed and well-nourished. No distress.   HENT:   Head: Normocephalic and atraumatic.   Right Ear: External ear normal.   Left Ear: External ear normal.   Nose: Nose normal.   Mouth/Throat: Oropharynx is clear and moist.   Enlarged tonsils bilaterally   Eyes: Pupils are equal, round, and reactive to light. Conjunctivae and EOM are normal.   Neck: Normal range of motion. Neck supple.   Cardiovascular: Normal rate, regular rhythm, normal heart sounds and intact distal pulses.    Pulmonary/Chest: Effort normal and breath sounds normal. No respiratory distress. He has no wheezes. He has no rales.   Abdominal: Soft. Bowel sounds are normal. There is no tenderness.   Musculoskeletal: Normal range of motion.   Neurological: He is alert and oriented to person, place, and time.   Skin: Skin is warm and dry. He is not diaphoretic.   Psychiatric: He has a normal mood and affect.   Nursing note and vitals reviewed.      Lab Results   Component Value Date    WBC 5.21 08/16/2017    HGB 14.9 08/16/2017    HCT 42.8 08/16/2017    MCV 90.5 08/16/2017    PLT 218 08/16/2017       '  Chemistry        Component Value Date/Time    NA 141 08/16/2017 1517    K 3.9 08/16/2017 1517    CL 105 08/16/2017 1517    CL 103 09/01/2012 1003    CO2 26 08/16/2017 1517    BUN 16.0 08/16/2017 1517    CREAT 0.8 08/16/2017 1517    CREAT 0.99 09/05/2013 0906    CREAT 0.87 09/01/2012 1003    GLU 87 08/16/2017 1517        Component Value Date/Time    CA 9.4   08/16/2017 1517    ALKPHOS 62 07/01/2017 0533    AST 10 07/01/2017 0533    ALT 12 07/01/2017 0533    BILITOTAL 0.6 07/01/2017 0533              Assessment/Plan:       1. Preoperative examination  - ECG 12 lead  - CBC without differential  - Basic Metabolic Panel    2. Left ventricular hypertrophy  - Echocardiogram Adult Complete W Clr/ Dopp Waveform; Future  - XR Chest 2 Views; Future    42 y.o. male presents for preop evaluation prior to tonsilectomy on 08/31/17 with Dr.Roya Azadamarki at [facility].     1. Preop cardiac clearance: By 2007 ACC/AHA guidelines, pt has no risk factors (no history of CAD, CHF, CVA, DM, or CKD)  he is low risk for an intermediate risk procedure. Can proceed with planned surgery.  CXR did not show cardiomegaly.  -METs: Pt is able to perform > or equal to 4 mets.   -B-blocker: not indicated at this time  -medication management: recommend stopping all nsaids including asa 7 days prior to procedure or as directed by surgeon. Can take meds the morning of the procedure with sips of water.   -Preop labs: ordered as above  -EKG: NSR, normal rate, normal axis, normal intervals, LVH, no ST-changes or TWI  -CXR: not requested/indicated    Follow up after procedure as needed. Call with questions and updates.     Alonzo Owczarzak, DO

## 2017-08-16 NOTE — Progress Notes (Signed)
Have you seen any specialists/other providers since your last visit with us?    Yes    Arm preference verified?   Yes    The patient is due for nothing at this time, HM is up-to-date.

## 2017-08-17 ENCOUNTER — Telehealth: Payer: No Typology Code available for payment source

## 2017-08-17 NOTE — Pre-Procedure Instructions (Addendum)
PSSRNPATIENTINSTRUCTIONSADULT    PER PT HE HAS LEFT VENTRICLE HYPERTROPHY SO HIS PMS  HAS SENT HIM TO [facility]- SHOULD SEE THEIR ECHO WHEN IT IS DONE.    Date of Procedure [facility] 6__.    Arrival Time is _1.5__ hours prior to surgical time which is currently scheduled for _1030 ARRIVE 0900___.  Preop will call you after 4pm the business day prior to your scheduled procedure to verify this time, time can change up until this time.    Eating and Drinking Instructions for day of surgery-  If time changes -Nothing By Mouth 8 hours prior to arrival time except clear liquids 4 hours prior to arrival time.  CLEAR LIQUIDS INCLUDE:WATER, BLACK COFFEE/TEA (NO MILK, CREAM, NON-DAIRY CREAMER OR SUGAR), CARBONATED BEVERAGES, JUICES WITHOUT PULP (APPLE, CRANBERRY, GRAPE), GATORADE    NPO(NO SOLIDS) AFTER 0100 MARCH 6, CLEAR FLUIDS TILL 0500 MARCHE 6    Special Medication Instructions from Anesthesiologist     NO VITAMINS, HERBAL TEAS, ANTIINFLAMATORIES 1 WK PRE SURGERY IE ASA. ADVIL    Azelastine-Fluticasone (DYMISTA) 137-50 MCG/ACT Suspension  2 sprays by Nasal route 2 (two) times daily.  Last Dose: Taking  Note written 08/17/2017 1428: MAY USE MORNING OF SURGERY    omeprazole (PRILOSEC) 20 MG capsule  Take 20 mg by mouth every morning.  Last Dose: Taking  Note written 08/17/2017 1429: TAKE MORNING OF SURGERY     SUMAtriptan (IMITREX) 100 MG tablet  Take 1 tablet (100 mg total) by mouth every 2 (two) hours as needed for Migraine.for up to 9 doses May repeat one time in 24 hours., Starting Fri 05/27/2017, Print, Last Dose: Taking  Note written 08/17/2017 1426: TAKE AS NEEDED     . Follow eating and drinking restrictions as instructed by surgeon and/or pre surgical services nurse.  . If applicable, follow your surgeon's bowel prep instructions.  . Failure to do so may result in cancellation of your procedure.  . Follow surgeons instructions if they gave specific medication instructions.      Hospital Address and Arrival  Instructions:   [address], [place], [place] [address]  [facility] is where you will enter.  A complimentary valet is available at the front [place] entrance of the hospital.  Please call [phone] morning of surgery if you need assistance upon arrival.    Upon arrival in the hospital main lobby via [employer], you will proceed to:  1) Patient Registration, which is all the way down the hall on the left.  2) Once registration is completed, you will be escorted by the registration staff to the Surgical Services Waiting Area where you wait until a PreOp clinical team member escorts you to a room and initiates the PreOp process.  3) You will need to have a valid photo I.D.,  insurance card and co-pay form of payment if required by insurance.    Pre Surgical General Instructions    1. Bathe or shower the morning of the procedure with an anti-bacterial soap before arriving (unless instructed to use hibiclens).  2. Do not apply lotion, perfume, cologne, or hair-care products such as hair spray or gels.  3. Do not shave your surgical site at home.  4. Do not wear makeup, jewelry including body piercing, watches, earrings, or rings.   5. You may brush your teeth and gargle on the morning of surgery but do not swallow any water.  6. Wear casual, loose fitting and comfortable clothing. A gown will be provided.  7. Plan to leave unnecessary valuables, credit cards (except for co-pay payment use) and jewelry at    home or with a companion on day of surgery for safe keeping. The hospital is not responsible for lost/stolen items.  8. If you wear contacts please leave them at home. If you wear glasses, please bring a case.  9. Dentures - we will provide a container  10. Hearing aids, you may bring dos but you will be asked to remove them before surgery.  11. Please arrange for someone to drive you home. For your safety you will not be allowed to drive home   after sedation or anesthesia. A responsible  adult must be present to accompany you home when you are ready to leave. We strongly recommend that all patients have an adult at home with them for the first 24 hours after surgery.  12. Notify your doctor if you develop any sign of illness before the date of your surgery. Report                 symptoms such as: high fever, sore throat, or other infection, breathing difficulties or chest pain.  13. Discontinue herbal supplements and herbal/green tea one week prior to surgery.      Below is a link/web address  to the Preparing for Your Procedure video that walks you through the surgical experience.   [email or web address]    Possible Anesthesia Side Effects  . Nausea and vomiting. This common side effect usually occurs immediately after the procedure, but some people may continue to feel sick for a day or two. Anti-nausea medicines can help.  . Dry mouth. You may feel parched when you wake up. As long as you're not too nauseated, sipping water can help take care of your dry mouth.  . Sore throat or hoarseness. The tube put in your throat to help you breathe during surgery can leave you with a sore throat after it's removed.  . Chills and shivering. It's common for your body temperature to drop during general anesthesia. Your doctors and nurses will make sure your temperature doesn't fall too much during surgery, but you may wake up shivering and feeling cold. Your chills may last for a few minutes to hours.  . Confusion and fuzzy thinking. When first waking from anesthesia, you may feel confused, drowsy, and foggy. This usually lasts for just a few hours, but for some people - especially older adults - confusion can last for days or weeks.  . Muscle aches. The drugs used to relax your muscles during surgery can cause soreness afterward.  . Itching. If narcotic (opioid) medications are used during or after your operation, you may be itchy. This is a common side effect of this class of  drugs.  . Bladder problems. You may have difficulty passing urine for a short time after general anesthesia.  . Dizziness. You may feel dizzy when you first stand up. Drinking plenty of fluids should help you feel better.

## 2017-08-18 ENCOUNTER — Other Ambulatory Visit (INDEPENDENT_AMBULATORY_CARE_PROVIDER_SITE_OTHER): Payer: Self-pay

## 2017-08-18 DIAGNOSIS — I517 Cardiomegaly: Secondary | ICD-10-CM

## 2017-08-21 ENCOUNTER — Encounter (INDEPENDENT_AMBULATORY_CARE_PROVIDER_SITE_OTHER): Payer: Self-pay | Admitting: Family Medicine

## 2017-08-22 ENCOUNTER — Telehealth (INDEPENDENT_AMBULATORY_CARE_PROVIDER_SITE_OTHER): Payer: Self-pay | Admitting: Family Medicine

## 2017-08-22 NOTE — Telephone Encounter (Signed)
I have updated the patient's preoperative note.  Fax a copy of the preoperative note to the surgical team.  The surgical team's preoperative clearance form is at my desk.

## 2017-08-23 NOTE — Telephone Encounter (Signed)
Preop faxed.

## 2017-08-29 ENCOUNTER — Ambulatory Visit
Admission: RE | Admit: 2017-08-29 | Discharge: 2017-08-29 | Disposition: A | Discharge status: Home / Self Care | Payer: No Typology Code available for payment source | Source: Ambulatory Visit | Attending: Family Medicine | Admitting: Family Medicine

## 2017-08-29 DIAGNOSIS — I517 Cardiomegaly: Secondary | ICD-10-CM | POA: Insufficient documentation

## 2017-08-30 NOTE — Anesthesia Preprocedure Evaluation (Addendum)
Anesthesia Evaluation    AIRWAY    Mallampati: I    TM distance: >3 FB  Neck ROM: full  Mouth Opening:full   CARDIOVASCULAR    cardiovascular exam normal       DENTAL    no notable dental hx     PULMONARY    pulmonary exam normal     OTHER FINDINGS                  Relevant Problems   No relevant active problems               Anesthesia Plan    ASA 2     general                     intravenous induction   Detailed anesthesia plan: general endotracheal        Post op pain management: per surgeon    informed consent obtained    Plan discussed with CRNA.      pertinent labs reviewed         3M GERD for tonsil    Signed by: Manus Rudd 08/30/17 1:56 PM

## 2017-08-31 ENCOUNTER — Ambulatory Visit: Payer: No Typology Code available for payment source | Admitting: Anesthesiology

## 2017-08-31 ENCOUNTER — Ambulatory Visit: Payer: Self-pay

## 2017-08-31 ENCOUNTER — Ambulatory Visit
Admission: RE | Admit: 2017-08-31 | Discharge: 2017-08-31 | Disposition: A | Discharge status: Home / Self Care | Payer: No Typology Code available for payment source | Source: Ambulatory Visit | Attending: Otolaryngology | Admitting: Otolaryngology

## 2017-08-31 ENCOUNTER — Encounter
Admission: RE | Disposition: A | Discharge status: Home / Self Care | Payer: Self-pay | Source: Ambulatory Visit | Attending: Otolaryngology

## 2017-08-31 DIAGNOSIS — G43909 Migraine, unspecified, not intractable, without status migrainosus: Secondary | ICD-10-CM | POA: Insufficient documentation

## 2017-08-31 DIAGNOSIS — F1099 Alcohol use, unspecified with unspecified alcohol-induced disorder: Secondary | ICD-10-CM

## 2017-08-31 DIAGNOSIS — I517 Cardiomegaly: Secondary | ICD-10-CM | POA: Insufficient documentation

## 2017-08-31 DIAGNOSIS — Z87891 Personal history of nicotine dependence: Secondary | ICD-10-CM

## 2017-08-31 DIAGNOSIS — K1379 Other lesions of oral mucosa: Secondary | ICD-10-CM | POA: Insufficient documentation

## 2017-08-31 DIAGNOSIS — J039 Acute tonsillitis, unspecified: Secondary | ICD-10-CM

## 2017-08-31 DIAGNOSIS — J3501 Chronic tonsillitis: Secondary | ICD-10-CM | POA: Insufficient documentation

## 2017-08-31 DIAGNOSIS — K1370 Unspecified lesions of oral mucosa: Secondary | ICD-10-CM

## 2017-08-31 DIAGNOSIS — K1230 Oral mucositis (ulcerative), unspecified: Secondary | ICD-10-CM | POA: Insufficient documentation

## 2017-08-31 HISTORY — PX: TONSILLECTOMY: SHX5618

## 2017-08-31 SURGERY — TONSILLECTOMY
Anesthesia: Anesthesia General | Wound class: Clean Contaminated

## 2017-08-31 MED ORDER — ONDANSETRON HCL 4 MG/2ML IJ SOLN
4.0000 mg | Freq: Once | INTRAMUSCULAR | Status: DC | PRN
Start: 2017-08-31 — End: 2017-08-31

## 2017-08-31 MED ORDER — LACTATED RINGERS IV SOLN
30.0000 mL/h | INTRAVENOUS | Status: DC
Start: 2017-08-31 — End: 2017-08-31
  Administered 2017-08-31: 14:00:00 30 mL/h via INTRAVENOUS

## 2017-08-31 MED ORDER — CELECOXIB 200 MG PO CAPS
400.0000 mg | ORAL_CAPSULE | Freq: Once | ORAL | Status: AC
Start: 2017-08-31 — End: 2017-08-31

## 2017-08-31 MED ORDER — PROPOFOL 10 MG/ML IV EMUL (WRAP)
INTRAVENOUS | Status: AC
Start: 2017-08-31 — End: ?
  Filled 2017-08-31: qty 20

## 2017-08-31 MED ORDER — ROCURONIUM BROMIDE 10 MG/ML IV SOLN (WRAP)
INTRAVENOUS | Status: DC | PRN
Start: 2017-08-31 — End: 2017-08-31
  Administered 2017-08-31: 35 mg via INTRAVENOUS

## 2017-08-31 MED ORDER — HYDROMORPHONE HCL 1 MG/ML IJ SOLN
INTRAMUSCULAR | Status: DC | PRN
Start: 2017-08-31 — End: 2017-08-31
  Administered 2017-08-31: 1 mg via INTRAVENOUS

## 2017-08-31 MED ORDER — DEXAMETHASONE SODIUM PHOSPHATE 4 MG/ML IJ SOLN
INTRAMUSCULAR | Status: AC
Start: 2017-08-31 — End: ?
  Filled 2017-08-31: qty 3

## 2017-08-31 MED ORDER — LACTATED RINGERS IV SOLN
INTRAVENOUS | Status: DC | PRN
Start: 2017-08-31 — End: 2017-08-31

## 2017-08-31 MED ORDER — LIDOCAINE HCL (PF) 2 % IJ SOLN
INTRAMUSCULAR | Status: AC
Start: 2017-08-31 — End: ?
  Filled 2017-08-31: qty 5

## 2017-08-31 MED ORDER — GABAPENTIN 300 MG PO CAPS
ORAL_CAPSULE | ORAL | Status: AC
Start: 2017-08-31 — End: 2017-08-31
  Administered 2017-08-31: 14:00:00 300 mg via ORAL
  Filled 2017-08-31: qty 1

## 2017-08-31 MED ORDER — BUPIVACAINE HCL (PF) 0.5 % IJ SOLN
INTRAMUSCULAR | Status: AC
Start: 2017-08-31 — End: ?
  Filled 2017-08-31: qty 30

## 2017-08-31 MED ORDER — GLYCOPYRROLATE 0.2 MG/ML IJ SOLN
INTRAMUSCULAR | Status: DC | PRN
Start: 2017-08-31 — End: 2017-08-31
  Administered 2017-08-31: .7 mg via INTRAVENOUS

## 2017-08-31 MED ORDER — PROPOFOL INFUSION 10 MG/ML
INTRAVENOUS | Status: DC | PRN
Start: 2017-08-31 — End: 2017-08-31
  Administered 2017-08-31: 150 mg via INTRAVENOUS

## 2017-08-31 MED ORDER — DEXAMETHASONE SODIUM PHOSPHATE 4 MG/ML IJ SOLN (WRAP)
INTRAMUSCULAR | Status: DC | PRN
Start: 2017-08-31 — End: 2017-08-31
  Administered 2017-08-31: 10 mg via INTRAVENOUS

## 2017-08-31 MED ORDER — LIDOCAINE HCL 2 % IJ SOLN
INTRAMUSCULAR | Status: DC | PRN
Start: 2017-08-31 — End: 2017-08-31
  Administered 2017-08-31: 100 mg

## 2017-08-31 MED ORDER — PROMETHAZINE HCL 25 MG/ML IJ SOLN
6.2500 mg | Freq: Once | INTRAMUSCULAR | Status: DC | PRN
Start: 2017-08-31 — End: 2017-08-31

## 2017-08-31 MED ORDER — ONDANSETRON HCL 4 MG/2ML IJ SOLN
INTRAMUSCULAR | Status: DC | PRN
Start: 2017-08-31 — End: 2017-08-31
  Administered 2017-08-31: 4 mg via INTRAVENOUS

## 2017-08-31 MED ORDER — GLYCOPYRROLATE 1 MG/5ML IJ SOLN
INTRAMUSCULAR | Status: AC
Start: 2017-08-31 — End: ?
  Filled 2017-08-31: qty 5

## 2017-08-31 MED ORDER — ACETAMINOPHEN 500 MG PO TABS
1000.0000 mg | ORAL_TABLET | Freq: Once | ORAL | Status: AC
Start: 2017-08-31 — End: 2017-08-31

## 2017-08-31 MED ORDER — FENTANYL CITRATE (PF) 50 MCG/ML IJ SOLN (WRAP)
25.0000 ug | INTRAMUSCULAR | Status: DC | PRN
Start: 2017-08-31 — End: 2017-08-31

## 2017-08-31 MED ORDER — NEOSTIGMINE METHYLSULFATE 1 MG/ML IJ/IV SOLN (WRAP)
Status: DC | PRN
Start: 2017-08-31 — End: 2017-08-31
  Administered 2017-08-31: 4 mg via INTRAVENOUS

## 2017-08-31 MED ORDER — NEOSTIGMINE METHYLSULFATE 1 MG/ML IJ/IV SOLN (WRAP)
Status: AC
Start: 2017-08-31 — End: ?
  Filled 2017-08-31: qty 10

## 2017-08-31 MED ORDER — LIDOCAINE 1% BUFFERED - CNR/OUTSOURCED
0.3000 mL | Freq: Once | INTRAMUSCULAR | Status: AC
Start: 2017-08-31 — End: 2017-08-31
  Administered 2017-08-31: 14:00:00 0.3 mL via INTRADERMAL

## 2017-08-31 MED ORDER — CELECOXIB 200 MG PO CAPS
ORAL_CAPSULE | ORAL | Status: AC
Start: 2017-08-31 — End: 2017-08-31
  Administered 2017-08-31: 14:00:00 400 mg via ORAL
  Filled 2017-08-31: qty 2

## 2017-08-31 MED ORDER — MIDAZOLAM HCL 2 MG/2ML IJ SOLN
INTRAMUSCULAR | Status: DC | PRN
Start: 2017-08-31 — End: 2017-08-31
  Administered 2017-08-31: 2 mg via INTRAVENOUS

## 2017-08-31 MED ORDER — MEPERIDINE HCL 25 MG/ML IJ SOLN
INTRAMUSCULAR | Status: AC
Start: 2017-08-31 — End: 2017-08-31
  Administered 2017-08-31: 18:00:00 12.5 mg
  Filled 2017-08-31: qty 1

## 2017-08-31 MED ORDER — ROCURONIUM BROMIDE 50 MG/5ML IV SOLN
INTRAVENOUS | Status: AC
Start: 2017-08-31 — End: ?
  Filled 2017-08-31: qty 5

## 2017-08-31 MED ORDER — LACTATED RINGERS IV SOLN
INTRAVENOUS | Status: DC
Start: 2017-08-31 — End: 2017-08-31

## 2017-08-31 MED ORDER — BUPIVACAINE HCL (PF) 0.5 % IJ SOLN
INTRAMUSCULAR | Status: DC | PRN
Start: 2017-08-31 — End: 2017-08-31
  Administered 2017-08-31: 4 mL

## 2017-08-31 MED ORDER — HYDROMORPHONE HCL 1 MG/ML IJ SOLN
INTRAMUSCULAR | Status: AC
Start: 2017-08-31 — End: ?
  Filled 2017-08-31: qty 1

## 2017-08-31 MED ORDER — FENTANYL CITRATE (PF) 50 MCG/ML IJ SOLN (WRAP)
INTRAMUSCULAR | Status: DC | PRN
Start: 2017-08-31 — End: 2017-08-31
  Administered 2017-08-31: 100 ug via INTRAVENOUS

## 2017-08-31 MED ORDER — GABAPENTIN 300 MG PO CAPS
300.0000 mg | ORAL_CAPSULE | Freq: Once | ORAL | Status: AC
Start: 2017-08-31 — End: 2017-08-31

## 2017-08-31 MED ORDER — MIDAZOLAM HCL 2 MG/2ML IJ SOLN
INTRAMUSCULAR | Status: AC
Start: 2017-08-31 — End: ?
  Filled 2017-08-31: qty 2

## 2017-08-31 MED ORDER — MEPERIDINE HCL 25 MG/ML IJ SOLN
12.5000 mg | Freq: Once | INTRAMUSCULAR | Status: DC
Start: 2017-08-31 — End: 2017-08-31

## 2017-08-31 MED ORDER — ONDANSETRON HCL 4 MG/2ML IJ SOLN
INTRAMUSCULAR | Status: AC
Start: 2017-08-31 — End: ?
  Filled 2017-08-31: qty 2

## 2017-08-31 MED ORDER — FENTANYL CITRATE (PF) 50 MCG/ML IJ SOLN (WRAP)
INTRAMUSCULAR | Status: AC
Start: 2017-08-31 — End: ?
  Filled 2017-08-31: qty 2

## 2017-08-31 MED ORDER — ACETAMINOPHEN 500 MG PO TABS
ORAL_TABLET | ORAL | Status: AC
Start: 2017-08-31 — End: 2017-08-31
  Administered 2017-08-31: 14:00:00 1000 mg via ORAL
  Filled 2017-08-31: qty 2

## 2017-08-31 SURGICAL SUPPLY — 35 items
BLADE ELECTRODE INSULATED (Cautery)
ELECTRODE ELECTROSURGICAL BLADE L4 IN (Cautery) IMPLANT
ELECTRODE ELECTROSURGICAL BLADE L4 IN OD3/32 IN EDGE L.2 IN INSULATE (Cautery) IMPLANT
ELECTRODE ELECTROSURGICAL BLADE PENCIL (Cautery) ×1 IMPLANT
ELECTRODE ELECTROSURGICAL BLADE PENCIL L10 FT VALLEYLAB E2515H (Cautery) ×1 IMPLANT
ELECTRODE ELECTROSURGICAL MICRO NEEDLE (Needles) ×1 IMPLANT
ELECTRODE ELECTROSURGICAL MICRO NEEDLE L3 CM DEROYAL TUNGSTEN ULTRA (Needles) ×1 IMPLANT
ELECTRODE ESURG BLDE EDG 3/32IN 4IN LF (Cautery)
ELECTRODE ESURG SS BLDE PNCL VLAB 10FT (Cautery) ×1
ELECTRODE ESURG TUNG MIC NDL 3CM LF UL (Needles) ×1
GLOVE SURG SUPER-SENSER SZ6 (Glove) ×2 IMPLANT
INHIBITOR ANTI FOG (Procedure Accessories) ×2 IMPLANT
KIT HEAD AND NECK (Pack) ×2 IMPLANT
NEEDLE 27G X 1/2" (Needles) ×2 IMPLANT
NEEDLE ELECTRODE (Cautery) ×2 IMPLANT
NEEDLE MICRO FIRE 3CM (Needles) ×1
PENCIL CAUTERY HNDSWTCH DISP (Cautery) ×1
SOL NACL .9% 500ML LATEX (IV Solutions) ×1
SOLUTION IV 0.9% NACL 500ML VFLX LF PLS (IV Solutions) ×1
SOLUTION IV 0.9% SODIUM CHLORIDE 500 ML (IV Solutions) ×1 IMPLANT
SOLUTION IV 0.9% SODIUM CHLORIDE 500 ML PLASTIC CONTAINER (IV Solutions) ×1 IMPLANT
SPONGE TONSIL MEDIUM (Sponge) IMPLANT
SUCTION COAGULATOR FOOT CNTRL (Suction) ×2 IMPLANT
SYRINGE LUER LOCK 10CC (Syringes, Needles) ×4 IMPLANT
TUBE NG PVC S SMP 14FR 48IN LF STRL (Tubing) ×1 IMPLANT
TUBE OD14 FR INTEGRAL FUNNEL CONNECTOR 2 LUMEN 5 IN 1 ADAPTER (Tubing) ×1 IMPLANT
TUBE SALEM SUMP DBL 14F 48IN (Tubing) ×1
TUBING CONNECTING STERILE 10FT (Tubing) ×1
TUBING SCT PVC ARG 3/16IN 10FT LF STRL (Tubing) ×1
TUBING SUCTION ID3/16 IN L10 FT (Tubing) ×1 IMPLANT
TUBING SUCTION ID3/16 IN L10 FT NONCONDUCTIVE STRAIGHT MALE FEMALE (Tubing) ×1 IMPLANT
WAND ELECTROSURGICAL 70 D INTEGRATE (Ablation) ×1 IMPLANT
WAND ELECTROSURGICAL 70 D INTEGRATE CABLE SUCTION EVAC 70 PLASMA (Ablation) ×1 IMPLANT
WAND ESURG PLSM 70D EVC 70 STRL INTGR (Ablation) ×1
WAND EVAC 70 XTRA W INTEGRATE (Ablation) ×1

## 2017-08-31 NOTE — Brief Op Note (Signed)
Preoperative Dx: Tonsillitis complications    Postoperative Dx:   same and chronic tonsillitis  Labial frenulum lesion    Procedure:   Tonsillectomy  Labial frenulum lesion excision    Anesthesia: GETA    Surgeon: Rondel Jumbo    EBL: 5 cc    IVF: as per anesthesia records    Complications: N/A    Findings: Defer to dictated op note    Specimen: Tonsils and labial frenulum lesion

## 2017-08-31 NOTE — Transfer of Care (Signed)
Anesthesia Transfer of Care Note    Patient: Christopher Trevino    Procedures performed: Procedure(s):  TONSILLECTOMY AND EXCISION OF FRENULUM LESION    Anesthesia type: General ETT    Patient location:Phase I PACU    Last vitals:   Vitals:    08/31/17 1410   BP: 131/88   Pulse: 83   Resp: 16   Temp: 36.9 C (98.5 F)   SpO2: 99%       Post pain: Patient not complaining of pain, continue current therapy      Mental Status:awake and alert     Respiratory Function: tolerating nasal cannula    Cardiovascular: stable    Nausea/Vomiting: patient not complaining of nausea or vomiting    Hydration Status: adequate    Post assessment: no apparent anesthetic complications, no reportable events and no evidence of recall    Signed by: Valora Corporal  08/31/17 5:30 PM

## 2017-08-31 NOTE — Discharge Instr - AVS First Page (Addendum)
Post Tonsillectomy Instructions:  Ear pain, mild neck stiffness and discomfort, bad breath, and minimal bleeding from the nose or blood in the sputum is expected.  Liquid hydrocodone/acetaminophen for pain. Tylenol or Motrin/Ibuprofen for pain is also ok. Please avoid if there are allergies.  Soft diet as tolerated for 2 weeks.  Light activity for 2 weeks.  Sleep with head at 30 degrees or more for 2 weeks.  Please follow the immediate post anesthesia diet restrictions.  The typical postoperative follow up is 3 weeks postoperatively. Please call for an appointment.  You can return to work in one week or later if instructions can be followed.    BLEEDING RISK:  5% of patients can bleed days 5-10 after surgery.  Please call MD immediately if this occurs.  Present to ER or call 911 immediately if bleeding is heavy.     MEDICATION:  Dymista on 09/01/2017.  Please Call office if you would like Lidocaine spray to be ordered for pain control.    Please call Dr. Redge Gainer office at [phone] in case of fever (temperature > 101 that lasts for more than 6 hours), change in mental status, significant neck stiffness and light sensitivity, significant bleeding from the nose that does not stop with holding pressure (pinching the soft part of the nose) for 15 minute intervals over an hour, or severe pain and headaches.  Please call 911 in case of chest pain or fevers.      Post Anesthesia Discharge Instructions    Although you may be awake and alert in the recovery room, small amounts of anesthetic remain in your system for about 24 hours.  You may feel tired and sleepy during this time.      You are advised to go directly home from the hospital.    Plan to stay at home and rest for the remainder of the day.    It is advisable to have someone with you at home for 24 hours after surgery.    Do not operate a motor vehicle, or any mechanical or electrical equipment for the next 24 hours.      Be careful when you are walking around,  you may become dizzy.  The effects of anesthesia and/or medications are still present and drowsiness may occur    Do not consume alcohol, tranquilizers, sleeping medications, or any other non prescribed medication for the remainder of the day.    Diet:  begin with liquids, progress your diet as tolerated or as directed by your surgeon.  Nausea and vomiting may occur in the next 24 hours.

## 2017-08-31 NOTE — Anesthesia Postprocedure Evaluation (Addendum)
Anesthesia Post Evaluation    Patient: Christopher Trevino    Procedure(s):  TONSILLECTOMY AND EXCISION OF FRENULUM LESION    Anesthesia type: general    Last Vitals:   Vitals:    08/31/17 1730   BP: 130/77   Pulse: 91   Resp: (!) 10   Temp: 36.6 C (97.8 F)   SpO2: 99%       Anesthesia Post Evaluation    Patient location during evaluation: PACU  Patient participation: complete - patient participated  Level of consciousness: awake and alert  Pain score: 1  Pain management: adequate  Airway patency: patent  Anesthetic complications: no  Cardiovascular status: acceptable  Respiratory status: acceptable and room air  Hydration status: acceptable        Anesthesia Qualified Clinical Data Registry 2018    PACU Reintubation  Did the Patient have general anesthesia with intubation: Yes  Did the Patient require reintubation in the PACU?: No  Was this a planned exubation trial (documented in the medical record)?: No    PONV Adult  Is the patient aged 22 or older: Yes  Did the patient receive recieve a general anesthestic: Yes  Does the patient have 3 or more risk factors for PONV? Yes  Did the patient receive anti-emetics from at least two classes of medications? Yes      PONV Pediatric  Is the patient aged 73-17? No            PACU Transfer Checklist Protocol  Was the patient transferred to the PACU at the conclusion of surgery? Yes  Was a checklist or transfer protocol used? Yes    ICU Transfer Checklist Protocol  Was the patient transferred to the ICU at the conclusion of surgery? No      Post-op Pain Assessment Prior to Anesthesia Care End  Age >=18 and assessed for pain in PACU: Yes  Pacu pain score <7/10: Yes      Perioperative Mortality  Perioperative mortality prior to Anesthesia end time: No    Perioperative Cardiac Arrest  Did the patient have an unanticipated intraoperative cardiac arrest between anesthesia start time and anesthesia end time? No    Unplanned Admission to ICU  Did the patient have an unplanned  admission to the ICU (not initially anticipated at anesthesia start time)? No      Signed by: Valora Corporal, 08/31/2017 5:31 PM

## 2017-08-31 NOTE — H&P (View-Only) (Signed)
Subjective:      Date: 08/16/2017 3:13 PM   Patient ID: MONISH HALIBURTON is a 42 y.o. male.    Chief Complaint   Patient presents with   . Pre-op Exam     not fasting       Jacklynn Lewis  presents to the office today for a preoperative consultation at the request of surgeon (Dr. .Tory Emerald) who plans on performing a tonsillectomy on March 6.   Patient has a history of Peritonsillar abscess, enlarged tonsils.    Planned anesthesia: general.   The patient has the following known anesthesia issues: none.   Patient bleeding risk: no recent or remote history of abnormal bleeding.   Patient does not have objections to receiving blood products if needed.    The surgeon has requested the following labs/studies to be performed: CBC, BMP, EKG     Patient Active Problem List   Diagnosis   . Peritonsillar abscess   . Failure of outpatient treatment   . Hyperglycemia   . History of migraine       Current Outpatient Prescriptions   Medication Sig Dispense Refill   . lansoprazole (PREVACID) 15 MG capsule Take 15 mg by mouth daily.     . cefuroxime (CEFTIN) 250 MG tablet Take 1 tablet (250 mg total) by mouth 2 (two) times daily. 14 tablet 0   . famotidine (PEPCID) 20 MG tablet Take 1 tablet (20 mg total) by mouth every 12 (twelve) hours. 20 tablet 0   . lactobacillus/streptococcus (RISAQUAD) Cap Take 1 capsule by mouth daily. 10 capsule 0   . oxyCODONE-acetaminophen (PERCOCET) 5-325 MG per tablet Take 1 tablet by mouth every 6 (six) hours as needed for Pain. 15 tablet 0   . polyethylene glycol (MIRALAX) packet Take 17 g by mouth 2 (two) times daily as needed (constipation). 30 packet 0   . predniSONE (DELTASONE) 10 MG tablet Take 3 tablets for 2 days then 2 tablets for 2 days then 1 tablet for 2 days 12 tablet 0   . SUMAtriptan (IMITREX) 100 MG tablet Take 1 tablet (100 mg total) by mouth every 2 (two) hours as needed for Migraine.for up to 9 doses May repeat one time in 24 hours. 9 tablet 5     No current  facility-administered medications for this visit.        No Known Allergies    Past Medical History:   Diagnosis Date   . Depression screening negative 02-04-12    Neg   . History of migraine        Past Surgical History:   Procedure Laterality Date   . INCISION & DRAINAGE, RETROPHARYNGEAL ABSCESS Left 07/01/2017    Procedure: INCISION & DRAINAGE, RETROPHARYNGEAL ABSCESS;  Surgeon: Francis Gaines, MD;  Location: [facility];  Service: ENT;  Laterality: Left;       Family History   Problem Relation Age of Onset   . Hyperlipidemia Mother    . Hypertension Mother    . Hyperlipidemia Father    . Hypertension Father        Social History     Social History   . Marital status: Married     Spouse name: N/A   . Number of children: N/A   . Years of education: N/A     Occupational History   . Not on file.     Social History Main Topics   . Smoking status: Former Smoker     Packs/day: 0.50  Years: 3.00     Quit date: 05/10/1999   . Smokeless tobacco: Never Used   . Alcohol use 3.6 oz/week     6 Cans of beer per week   . Drug use: No   . Sexual activity: Not on file     Other Topics Concern   . Not on file     Social History Narrative   . No narrative on file       The following portions of the patient's history were reviewed and updated as appropriate: allergies, current medications, past family history, past medical history, past social history, past surgical history and problem list.    Review of Systems   Constitutional: Negative for chills, diaphoresis and fever.   HENT: Positive for sore throat.    Eyes: Negative for visual disturbance.   Respiratory: Negative for cough and shortness of breath.    Cardiovascular: Negative for chest pain and palpitations.   Gastrointestinal: Negative for abdominal pain and blood in stool.   Genitourinary: Negative for dysuria and hematuria.   Musculoskeletal: Negative for arthralgias and myalgias.   Skin: Negative for color change.   Neurological: Negative for dizziness, syncope,  light-headedness and headaches.   Psychiatric/Behavioral: Negative for confusion and dysphoric mood.        Objective:     BP 131/85   Pulse 75   Temp 98 F (36.7 C) (Oral)   Resp 16   Ht 1.689 m (5' 6.5")   Wt 67.1 kg (148 lb)   SpO2 99%   BMI 23.53 kg/m     Physical Exam   Constitutional: He is oriented to person, place, and time. He appears well-developed and well-nourished. No distress.   HENT:   Head: Normocephalic and atraumatic.   Right Ear: External ear normal.   Left Ear: External ear normal.   Nose: Nose normal.   Mouth/Throat: Oropharynx is clear and moist.   Enlarged tonsils bilaterally   Eyes: Pupils are equal, round, and reactive to light. Conjunctivae and EOM are normal.   Neck: Normal range of motion. Neck supple.   Cardiovascular: Normal rate, regular rhythm, normal heart sounds and intact distal pulses.    Pulmonary/Chest: Effort normal and breath sounds normal. No respiratory distress. He has no wheezes. He has no rales.   Abdominal: Soft. Bowel sounds are normal. There is no tenderness.   Musculoskeletal: Normal range of motion.   Neurological: He is alert and oriented to person, place, and time.   Skin: Skin is warm and dry. He is not diaphoretic.   Psychiatric: He has a normal mood and affect.   Nursing note and vitals reviewed.      Lab Results   Component Value Date    WBC 5.21 08/16/2017    HGB 14.9 08/16/2017    HCT 42.8 08/16/2017    MCV 90.5 08/16/2017    PLT 218 08/16/2017       '  Chemistry        Component Value Date/Time    NA 141 08/16/2017 1517    K 3.9 08/16/2017 1517    CL 105 08/16/2017 1517    CL 103 09/01/2012 1003    CO2 26 08/16/2017 1517    BUN 16.0 08/16/2017 1517    CREAT 0.8 08/16/2017 1517    CREAT 0.99 09/05/2013 0906    CREAT 0.87 09/01/2012 1003    GLU 87 08/16/2017 1517        Component Value Date/Time    CA 9.4  08/16/2017 1517    ALKPHOS 62 07/01/2017 0533    AST 10 07/01/2017 0533    ALT 12 07/01/2017 0533    BILITOTAL 0.6 07/01/2017 0533              Assessment/Plan:       1. Preoperative examination  - ECG 12 lead  - CBC without differential  - Basic Metabolic Panel    2. Left ventricular hypertrophy  - Echocardiogram Adult Complete W Clr/ Dopp Waveform; Future  - XR Chest 2 Views; Future    42 y.o. male presents for preop evaluation prior to tonsilectomy on 08/31/17 with Dr.Roya Azadamarki at [facility].     1. Preop cardiac clearance: By 2007 ACC/AHA guidelines, pt has no risk factors (no history of CAD, CHF, CVA, DM, or CKD)  he is low risk for an intermediate risk procedure. Can proceed with planned surgery.  CXR did not show cardiomegaly.  -METs: Pt is able to perform > or equal to 4 mets.   -B-blocker: not indicated at this time  -medication management: recommend stopping all nsaids including asa 7 days prior to procedure or as directed by surgeon. Can take meds the morning of the procedure with sips of water.   -Preop labs: ordered as above  -EKG: NSR, normal rate, normal axis, normal intervals, LVH, no ST-changes or TWI  -CXR: not requested/indicated    Follow up after procedure as needed. Call with questions and updates.     Daisey Must, DO

## 2017-08-31 NOTE — PACU (Signed)
Patient doing well VSS> Family at the bedside. No complaints of pain or nausea. Patient stating he is ready to go home. Patient given discharge instructions and discharged from phase 1.

## 2017-08-31 NOTE — Interval H&P Note (Signed)
Patient seen and examined before surgery  No changes to be documented

## 2017-09-01 ENCOUNTER — Encounter: Payer: Self-pay | Admitting: Otolaryngology

## 2017-09-01 NOTE — Op Note (Signed)
Procedure Date: 08/31/2017     Patient Type: A     SURGEON: Francis Gaines MD  ASSISTANT: N/A      PREOPERATIVE DIAGNOSES:  1.  Complicated tonsillitis.  2.  Chronic tonsillitis.  3.  Labial frenulum lesion (oral cavity lesion).     POSTOPERATIVE DIAGNOSES:  1.  Complicated tonsillitis.  2.  Chronic tonsillitis.  3.  Labial frenulum lesion (oral cavity lesion).     TITLE OF PROCEDURES:  1.  Tonsillectomy.  2.  Excisional biopsy of labial frenulum lesion.     ANESTHESIA:  GETA.     ESTIMATED BLOOD LOSS:  Minimal.     INTRAVENOUS FLUIDS:  As per anesthesia records.     FINDINGS:  1.  3+ inflamed and enlarged, as well as endophytic tonsils.  2.  An incidental finding of a small lesion of the labial frenulum was  noted.  An excisional biopsy of this lesion was performed.     INDICATIONS FOR SPECIMEN:  1.  Bilateral tonsils.  2.  Labial frenulum lesion.     CULTURES:  None applicable.     DISPOSITION:  The patient was extubated and transferred to the postoperative care in  stable condition.     COMPLICATIONS:  Not applicable.     DESCRIPTION OF PROCEDURE:  The patient was appropriately identified based on operating room protocol.   He was placed in supine position.  General anesthesia was induced without  incident.  Head of bed was turned 90 degrees away from anesthesia.  A  shoulder roll was placed for gentle neck extension.  The head was wrapped  using a blue towel.  Using a U-drape, the draping process was completed.   Using a Crowe-Davis mouth retractor, the oropharynx was brought into view.   The retractor was suspended on a Mayo stand.  Using an Allis clamp, the  right tonsil was grasped and medialized.  Using the Coblator device, the  tonsil was removed.  Hemostasis was assured using the coagulation setting  on the Coblator device.  The same procedure was performed on the left side.   0.5% was injected along the anterior tonsillar pillar and base of tongue  bilaterally.  The area was thoroughly irrigated and  suctioned clear.  An  orogastric tube was passed and stomach content was suctioned clear.  The  Crowe-Davis mouth retractor was subsequently removed out of suspension and  out of the patient's oral cavity with great caution.  The upper lip was everted and using pickups and a needle-tip Bovie, the small lesion along the labial frenulum was removed.  Hemostasis was assured at the site.  The patient was turned back to anesthesia.  Anesthesia was reversed without incident.  The patient was transferred to the postoperative care unit in stable condition.           D:  08/31/2017 21:41 PM by Dr. Francis Gaines, MD ([id number])  T:  09/01/2017 05:16 AM by NTS      (Conf: [id number]) (Doc ID: [id number])

## 2017-09-02 ENCOUNTER — Emergency Department: Payer: No Typology Code available for payment source

## 2017-09-02 ENCOUNTER — Inpatient Hospital Stay
Admission: EM | Admit: 2017-09-02 | Discharge: 2017-09-04 | DRG: 392 | Disposition: A | Discharge status: Home / Self Care | Payer: No Typology Code available for payment source | Attending: Family Medicine | Admitting: Family Medicine

## 2017-09-02 DIAGNOSIS — R739 Hyperglycemia, unspecified: Secondary | ICD-10-CM | POA: Diagnosis present

## 2017-09-02 DIAGNOSIS — Z8669 Personal history of other diseases of the nervous system and sense organs: Secondary | ICD-10-CM

## 2017-09-02 DIAGNOSIS — K5900 Constipation, unspecified: Secondary | ICD-10-CM | POA: Diagnosis present

## 2017-09-02 DIAGNOSIS — K219 Gastro-esophageal reflux disease without esophagitis: Secondary | ICD-10-CM | POA: Diagnosis present

## 2017-09-02 DIAGNOSIS — K529 Noninfective gastroenteritis and colitis, unspecified: Principal | ICD-10-CM | POA: Diagnosis present

## 2017-09-02 DIAGNOSIS — K625 Hemorrhage of anus and rectum: Secondary | ICD-10-CM

## 2017-09-02 DIAGNOSIS — R1030 Lower abdominal pain, unspecified: Secondary | ICD-10-CM | POA: Diagnosis present

## 2017-09-02 DIAGNOSIS — Z87891 Personal history of nicotine dependence: Secondary | ICD-10-CM

## 2017-09-02 LAB — LACTIC ACID, PLASMA: Lactic Acid: 0.6 mmol/L (ref 0.2–2.0)

## 2017-09-02 LAB — COMPREHENSIVE METABOLIC PANEL
ALT: 14 U/L (ref 0–55)
AST (SGOT): 20 U/L (ref 5–34)
Albumin/Globulin Ratio: 1.2 (ref 0.9–2.2)
Albumin: 3.8 g/dL (ref 3.5–5.0)
Alkaline Phosphatase: 65 U/L (ref 38–106)
Anion Gap: 13 (ref 5.0–15.0)
BUN: 10.6 mg/dL (ref 9.0–28.0)
Bilirubin, Total: 0.9 mg/dL (ref 0.2–1.2)
CO2: 24 mEq/L (ref 22–29)
Calcium: 9.3 mg/dL (ref 8.5–10.5)
Chloride: 104 mEq/L (ref 100–111)
Creatinine: 0.8 mg/dL (ref 0.7–1.3)
Globulin: 3.1 g/dL (ref 2.0–3.6)
Glucose: 108 mg/dL — ABNORMAL HIGH (ref 70–100)
Potassium: 4.7 mEq/L (ref 3.5–5.1)
Protein, Total: 6.9 g/dL (ref 6.0–8.3)
Sodium: 141 mEq/L (ref 136–145)

## 2017-09-02 LAB — CBC AND DIFFERENTIAL
Absolute NRBC: 0 10*3/uL (ref 0.00–0.00)
Basophils Absolute Automated: 0.02 10*3/uL (ref 0.00–0.08)
Basophils Automated: 0.2 %
Eosinophils Absolute Automated: 0 10*3/uL (ref 0.00–0.44)
Eosinophils Automated: 0 %
Hematocrit: 43.9 % (ref 37.6–49.6)
Hgb: 15.8 g/dL (ref 12.5–17.1)
Immature Granulocytes Absolute: 0.05 10*3/uL (ref 0.00–0.07)
Immature Granulocytes: 0.4 %
Lymphocytes Absolute Automated: 0.77 10*3/uL (ref 0.42–3.22)
Lymphocytes Automated: 6.3 %
MCH: 31.6 pg (ref 25.1–33.5)
MCHC: 36 g/dL — ABNORMAL HIGH (ref 31.5–35.8)
MCV: 87.8 fL (ref 78.0–96.0)
MPV: 11.5 fL (ref 8.9–12.5)
Monocytes Absolute Automated: 1.1 10*3/uL — ABNORMAL HIGH (ref 0.21–0.85)
Monocytes: 9 %
Neutrophils Absolute: 10.31 10*3/uL — ABNORMAL HIGH (ref 1.10–6.33)
Neutrophils: 84.1 %
Nucleated RBC: 0 /100 WBC (ref 0.0–0.0)
Platelets: 198 10*3/uL (ref 142–346)
RBC: 5 10*6/uL (ref 4.20–5.90)
RDW: 11 % (ref 11–15)
WBC: 12.25 10*3/uL — ABNORMAL HIGH (ref 3.10–9.50)

## 2017-09-02 LAB — HEMOGLOBIN AND HEMATOCRIT, BLOOD
Hematocrit: 43.3 % (ref 37.6–49.6)
Hgb: 15.5 g/dL (ref 12.5–17.1)

## 2017-09-02 LAB — LAB USE ONLY - HISTORICAL SURGICAL PATHOLOGY

## 2017-09-02 LAB — PT AND APTT
PT INR: 1 (ref 0.9–1.1)
PT: 12.9 s (ref 12.6–15.0)
PTT: 26 s (ref 23–37)

## 2017-09-02 LAB — GFR: EGFR: >60

## 2017-09-02 MED ORDER — PANTOPRAZOLE SODIUM 40 MG IV SOLR
40.00 mg | Freq: Every day | INTRAVENOUS | Status: DC
Start: 2017-09-02 — End: 2017-09-04
  Administered 2017-09-02 – 2017-09-04 (×3): 40 mg via INTRAVENOUS
  Filled 2017-09-02 (×3): qty 40

## 2017-09-02 MED ORDER — SODIUM CHLORIDE 0.9 % IV BOLUS
1000.00 mL | Freq: Once | INTRAVENOUS | Status: AC
Start: 2017-09-02 — End: 2017-09-02
  Administered 2017-09-02: 14:00:00 1000 mL via INTRAVENOUS

## 2017-09-02 MED ORDER — ONDANSETRON HCL 4 MG/2ML IJ SOLN
4.00 mg | Freq: Once | INTRAMUSCULAR | Status: AC
Start: 2017-09-02 — End: 2017-09-02
  Administered 2017-09-02: 14:00:00 4 mg via INTRAVENOUS
  Filled 2017-09-02: qty 2

## 2017-09-02 MED ORDER — RISAQUAD PO CAPS
1.00 | ORAL_CAPSULE | Freq: Every day | ORAL | Status: DC
Start: 2017-09-02 — End: 2017-09-04
  Administered 2017-09-02 – 2017-09-04 (×3): 1 via ORAL
  Filled 2017-09-02 (×3): qty 1

## 2017-09-02 MED ORDER — SODIUM CHLORIDE 0.9 % IV SOLN
INTRAVENOUS | Status: DC
Start: 2017-09-02 — End: 2017-09-04

## 2017-09-02 MED ORDER — MORPHINE SULFATE 10 MG/5ML PO SOLN
5.00 mg | Freq: Four times a day (QID) | ORAL | Status: DC | PRN
Start: 2017-09-02 — End: 2017-09-02
  Administered 2017-09-02: 5 mg via ORAL
  Filled 2017-09-02 (×4): qty 2.5

## 2017-09-02 MED ORDER — MORPHINE SULFATE 2 MG/ML IJ/IV SOLN (WRAP)
2.0000 mg | Status: DC | PRN
Start: 2017-09-02 — End: 2017-09-04
  Administered 2017-09-02 – 2017-09-03 (×6): 2 mg via INTRAVENOUS
  Filled 2017-09-02 (×6): qty 1

## 2017-09-02 MED ORDER — FENTANYL CITRATE (PF) 50 MCG/ML IJ SOLN (WRAP)
50.00 ug | Freq: Once | INTRAMUSCULAR | Status: AC
Start: 2017-09-02 — End: 2017-09-02
  Administered 2017-09-02: 14:00:00 50 ug via INTRAVENOUS
  Filled 2017-09-02: qty 2

## 2017-09-02 MED ORDER — NALOXONE HCL 0.4 MG/ML IJ SOLN (WRAP)
0.20 mg | INTRAMUSCULAR | Status: DC | PRN
Start: 2017-09-02 — End: 2017-09-04

## 2017-09-02 MED ORDER — IOHEXOL 240 MG/ML IJ SOLN
50.00 mL | Freq: Once | INTRAMUSCULAR | Status: AC
Start: 2017-09-02 — End: 2017-09-02
  Administered 2017-09-02: 14:00:00 50 mL via ORAL

## 2017-09-02 MED ORDER — IOHEXOL 350 MG/ML IV SOLN
100.00 mL | Freq: Once | INTRAVENOUS | Status: AC | PRN
Start: 2017-09-02 — End: 2017-09-02
  Administered 2017-09-02: 16:00:00 100 mL via INTRAVENOUS

## 2017-09-02 MED ORDER — DICYCLOMINE HCL 10 MG/ML IM SOLN
20.00 mg | Freq: Once | INTRAMUSCULAR | Status: AC
Start: 2017-09-02 — End: 2017-09-02
  Administered 2017-09-02: 17:00:00 20 mg via INTRAMUSCULAR
  Filled 2017-09-02: qty 2

## 2017-09-02 MED ORDER — SUMATRIPTAN SUCCINATE 50 MG PO TABS
100.00 mg | ORAL_TABLET | ORAL | Status: DC | PRN
Start: 2017-09-02 — End: 2017-09-04
  Filled 2017-09-02: qty 4

## 2017-09-02 NOTE — Progress Notes (Signed)
Received pt from ER via stretcher in stable condition assessment completed pt resting in bed no needs voiced at this time oriented to room and call bell plan of care and fall prevention reviewed call bell within reach will continue to monitor

## 2017-09-02 NOTE — Plan of Care (Signed)
Problem: Safety  Goal: Patient will be free from injury during hospitalization  Outcome: Progressing   09/02/17 2333   Goal/Interventions addressed this shift   Patient will be free from injury during hospitalization  Assess patient's risk for falls and implement fall prevention plan of care per policy;Provide and maintain safe environment;Use appropriate transfer methods;Ensure appropriate safety devices are available at the bedside;Include patient/ family/ care giver in decisions related to safety;Hourly rounding;Assess for patients risk for elopement and implement Elopement Risk Plan per policy;Provide alternative method of communication if needed (communication boards, writing)       Problem: Pain  Goal: Pain at adequate level as identified by patient  Outcome: Progressing   09/02/17 2333   Goal/Interventions addressed this shift   Pain at adequate level as identified by patient Identify patient comfort function goal;Assess for risk of opioid induced respiratory depression, including snoring/sleep apnea. Alert healthcare team of risk factors identified.;Assess pain on admission, during daily assessment and/or before any "as needed" intervention(s);Reassess pain within 30-60 minutes of any procedure/intervention, per Pain Assessment, Intervention, Reassessment (AIR) Cycle;Evaluate if patient comfort function goal is met;Evaluate patient's satisfaction with pain management progress;Offer non-pharmacological pain management interventions;Consult/collaborate with Pain Service

## 2017-09-02 NOTE — ED Provider Notes (Addendum)
Physician/Midlevel provider first contact with patient: 09/02/17 1339         History     Chief Complaint   Patient presents with   . Rectal Bleeding   . Abdominal Pain     42 year old male with abdominal pain and rectal bleeding.  Describes 3-4 bouts of rectal bleeding without stool.  Describes it as bright blood.  Also has pain in the bilateral lower abdomen.  Had a tonsillectomy 2 days ago.  Denies any blood thinner use.  He has taken oxycodone and 1 a lot of tablet for pain control.  Denies prior abdominal surgery.      The history is provided by the patient.   Rectal Bleeding   Quality:  Bright red  Duration:  6 hours  Timing:  Intermittent  Chronicity:  New  Similar prior episodes: no    Relieved by:  Nothing  Worsened by:  Nothing  Ineffective treatments:  None tried  Associated symptoms: abdominal pain    Associated symptoms: no dizziness and no fever    Risk factors: no anticoagulant use    Abdominal Pain   Pain location:  Generalized  Pain quality: aching    Pain radiates to:  Does not radiate  Pain severity:  Moderate  Onset quality:  Sudden  Timing:  Constant  Chronicity:  New  Relieved by:  Nothing  Worsened by:  Nothing  Ineffective treatments:  None tried  Associated symptoms: hematochezia    Associated symptoms: no anorexia, no chest pain, no chills, no dysuria, no fever, no nausea and no shortness of breath             Past Medical History:   Diagnosis Date   . Gastroesophageal reflux disease     TAKES PRILOSEC   . Headache     LAST MIGRANE Aug 17 2017  EVEN 1 BEER OR GLASS OF WINE BRINGS IT ON   . Tonsil, abscess 06/28/2017    LT TONSIL- HAD SX JAN 4 2019HAS RESOLVED        Past Surgical History:   Procedure Laterality Date   . INCISION & DRAINAGE, RETROPHARYNGEAL ABSCESS Left 07/01/2017    Procedure: INCISION & DRAINAGE, RETROPHARYNGEAL ABSCESS;  Surgeon: Francis Gaines, MD;  Location: [facility];  Service: ENT;  Laterality: Left;   . NASAL SEPTUM SURGERY  AGE 71   . TONSILLECTOMY N/A  08/31/2017    Procedure: TONSILLECTOMY AND EXCISION OF FRENULUM LESION;  Surgeon: Francis Gaines, MD;  Location: [facility];  Service: ENT;  Laterality: N/A;       Family History   Problem Relation Age of Onset   . Hyperlipidemia Mother    . Hypertension Mother    . Hyperlipidemia Father    . Hypertension Father        Social  Social History   Substance Use Topics   . Smoking status: Former Smoker     Packs/day: 0.50     Years: 3.00     Quit date: 05/10/1999   . Smokeless tobacco: Never Used   . Alcohol use 0.0 - 1.2 oz/week      Comment: USED TO DO 6 BEE A WK DECREASED Jun 24 2017       .     No Known Allergies    Home Medications     Med List Status:  In Progress Set By: Allene Dillon, RN at 09/02/2017  1:18 PM  Azelastine-Fluticasone (DYMISTA) 137-50 MCG/ACT Suspension     2 sprays by Nasal route 2 (two) times daily.     HYDROcodone-acetaminophen (NORCO) 5-325 MG per tablet     Take 1 tablet by mouth every 6 (six) hours as needed.     lansoprazole (PREVACID) 15 MG capsule     Take 15 mg by mouth.         omeprazole (PRILOSEC) 20 MG capsule     Take 20 mg by mouth every morning.     ondansetron (ZOFRAN) 4 MG tablet     Take 4 mg by mouth every 8 (eight) hours as needed for Nausea.     polyethylene glycol (MIRALAX) packet     Take 17 g by mouth daily.     SUMAtriptan (IMITREX) 100 MG tablet     Take 1 tablet (100 mg total) by mouth every 2 (two) hours as needed for Migraine.for up to 9 doses May repeat one time in 24 hours.           Review of Systems   Constitutional: Negative for activity change, chills and fever.   Eyes: Negative for photophobia and pain.   Respiratory: Negative for apnea and shortness of breath.    Cardiovascular: Negative for chest pain and palpitations.   Gastrointestinal: Positive for abdominal pain, blood in stool and hematochezia. Negative for abdominal distention, anorexia and nausea.   Genitourinary: Negative for difficulty urinating, dysuria, flank pain and  frequency.   Musculoskeletal: Negative for back pain, joint swelling, neck pain and neck stiffness.   Skin: Negative for color change and rash.   Allergic/Immunologic: Negative for environmental allergies.   Neurological: Negative for dizziness and headaches.   Hematological: Negative for adenopathy. Does not bruise/bleed easily.   Psychiatric/Behavioral: Negative for agitation. The patient is not nervous/anxious.    All other systems reviewed and are negative.      Physical Exam    BP: 136/90, Heart Rate: 98, Temp: 98.8 F (37.1 C), Resp Rate: 16, SpO2: 97 %, Weight: 65.9 kg    Physical Exam   Constitutional: He is oriented to person, place, and time. He appears well-developed and well-nourished. No distress.   HENT:   Head: Normocephalic and atraumatic.   Eyes: Pupils are equal, round, and reactive to light. Conjunctivae and EOM are normal.   Neck: Normal range of motion. Neck supple. Carotid bruit is not present.   Cardiovascular: Normal rate and regular rhythm.  Exam reveals no friction rub.    No murmur heard.  Pulmonary/Chest: Effort normal and breath sounds normal. No stridor. No respiratory distress. He has no wheezes. He exhibits no tenderness.   Abdominal: Soft. He exhibits no distension. There is tenderness. There is no rebound and no guarding.   Musculoskeletal: He exhibits no edema or tenderness.   Neurological: He is alert and oriented to person, place, and time. No cranial nerve deficit. He exhibits normal muscle tone. Coordination normal.   Skin: Skin is warm, dry and intact. Capillary refill takes less than 2 seconds. He is not diaphoretic. No erythema.   Psychiatric: He has a normal mood and affect. His behavior is normal. Judgment normal.   Nursing note and vitals reviewed.        MDM and ED Course     ED Medication Orders     Start Ordered     Status Ordering Provider    09/02/17 1408 09/02/17 1407  fentaNYL (PF) (SUBLIMAZE) injection 50 mcg  Once  Route: Intravenous  Ordered Dose: 50 mcg      Last MAR action:  Given Yishai Rehfeld J    09/02/17 1408 09/02/17 1407  ondansetron (ZOFRAN) injection 4 mg  Once     Route: Intravenous  Ordered Dose: 4 mg     Last MAR action:  Given Mykiah Schmuck J    09/02/17 1343 09/02/17 1342  sodium chloride 0.9 % bolus 1,000 mL  Once     Route: Intravenous  Ordered Dose: 1,000 mL     Last MAR action:  Stopped Norvell Ureste J    09/02/17 1343 09/02/17 1342  iohexol (OMNIPAQUE) 240 MG/ML solution 50 mL  Once     Route: Oral  Ordered Dose: 50 mL     Last MAR action:  Imaging Agent Given Braxson Hollingsworth J             MDM  Number of Diagnoses or Management Options  Colitis:   Rectal bleeding:   Diagnosis management comments: I, Roselee Culver, DO (emergency medicine physician), have been the primary provider for this patient during this Emergency Dept visit.     I have reviewed nursing notes on family and social history, past medical issues, and recorded vital signs.    I have reviewed all laboratory tests and radiographic studies (final reports only if available) and have explained these results to the patient and/or family bedside.    Oxygen saturation by pulse oximetry is 95%-100%, Normal.  Interventions: None Needed.    DDX: Including but not limited to, colitis, postsurgical bleeding with gastric irritation, hemorrhoid, c diff colitis    Patient with lower abdominal tenderness.  There is no distention.  No rebound tenderness.  Given tonsil surgery 2 days ago swallowed blood and irritation to the gut seems most likely.  However, pain is lower and the patient describes multiple bloody bowel movements of bright blood.  He has no evidence of bleeding from his oropharynx currently.    Approach to workup expected evaluation and time course.     Patient with no fever, no diarrhea, only intermittent blood from rectum, not actively hemorrhaging here in ER.  CT consistent with colitis.  Infectious versus inflammatory cause cannot be fully elucidated at this time.  Given no  fever.  We will withhold antibiotics for now.  Patient is advised should he develop diarrhea.  Stool cultures are strongly recommended.  Should he have any new or worsened symptoms, she should return to the ER immediately.  He should follow up with gastroenterology for further diagnostic testing.    4:11 PM  Pt fearful of worsening rectal bleeding and given amount at home and associated colitis on CT, observation very reasonable to ensure continued stability. Also prefer to culture stool prior to empiric abx    D/w Morrie Sheldon, NP for hospitalists.       Amount and/or Complexity of Data Reviewed  Clinical lab tests: ordered and reviewed  Tests in the radiology section of CPT: ordered and reviewed  Tests in the medicine section of CPT: ordered and reviewed  Decide to obtain previous medical records or to obtain history from someone other than the patient: yes  Obtain history from someone other than the patient: yes  Review and summarize past medical records: yes        Results     Procedure Component Value Units Date/Time    PT/APTT [[id number]] Collected:  09/02/17 1400     Updated:  09/02/17 1441     PT 12.9  sec      PT INR 1.0     PT Anticoag. Given Within 48 hrs. None     PTT 26 sec     Comprehensive metabolic panel [[id number]]  (Abnormal) Collected:  09/02/17 1400    Specimen:  Blood Updated:  09/02/17 1438     Glucose 108 (H) mg/dL      BUN 10.2 mg/dL      Creatinine 0.8 mg/dL      Sodium 725 mEq/L      Potassium 4.7 mEq/L      Chloride 104 mEq/L      CO2 24 mEq/L      Calcium 9.3 mg/dL      Protein, Total 6.9 g/dL      Albumin 3.8 g/dL      AST (SGOT) 20 U/L      ALT 14 U/L      Alkaline Phosphatase 65 U/L      Bilirubin, Total 0.9 mg/dL      Globulin 3.1 g/dL      Albumin/Globulin Ratio 1.2     Anion Gap 13.0    GFR [[id number]] Collected:  09/02/17 1400     Updated:  09/02/17 1438     EGFR >60.0    CBC with differential [[id number]]  (Abnormal) Collected:  09/02/17 1400    Specimen:  Blood from Blood Updated:   09/02/17 1418     WBC 12.25 (H) x10 3/uL      Hgb 15.8 g/dL      Hematocrit 56.4 %      Platelets 198 x10 3/uL      RBC 5.00 x10 6/uL      MCV 87.8 fL      MCH 31.6 pg      MCHC 36.0 (H) g/dL      RDW 11 %      MPV 11.5 fL      Neutrophils 84.1 %      Lymphocytes Automated 6.3 %      Monocytes 9.0 %      Eosinophils Automated 0.0 %      Basophils Automated 0.2 %      Immature Granulocyte 0.4 %      Nucleated RBC 0.0 /100 WBC      Neutrophils Absolute 10.31 (H) x10 3/uL      Abs Lymph Automated 0.77 x10 3/uL      Abs Mono Automated 1.10 (H) x10 3/uL      Abs Eos Automated 0.00 x10 3/uL      Absolute Baso Automated 0.02 x10 3/uL      Absolute Immature Granulocyte 0.05 x10 3/uL      Absolute NRBC 0.00 x10 3/uL              Radiology Results (24 Hour)     Procedure Component Value Units Date/Time    CT Abdomen Pelvis W IV And PO Cont [[id number]] Collected:  09/02/17 1550    Order Status:  Completed Updated:  09/02/17 1556    Narrative:       Patient: Lower abdominal pain.    Procedure: Contrast-enhanced CT of the abdomen and pelvis. 100 mL of  Omnipaque 350 were administered intravenously. Oral contrast material is  also present.    Findings: There is moderate to severe diffuse thickening of the colonic  wall consistent with colitis. Some fluid is present in the rectum.  Correlate for diarrhea. No dilated bowel loops. No free intraperitoneal  gas or free fluid. The appendix  is seen extending superomedially from  the cecal tip. No evidence of appendicitis.    Unremarkable liver and gallbladder. No dilatation of the biliary tree.  Unremarkable spleen, pancreas, adrenals, kidneys, and abdominal aorta.  Normal cardiac size. Included portions of the lungs are unremarkable.      Impression:        Colitis.    Wynema Birch, MD   09/02/2017 3:52 PM                Procedures    Clinical Impression & Disposition     Clinical Impression  Final diagnoses:   Rectal bleeding   Colitis        ED Disposition     ED Disposition  Condition Date/Time Comment    Observation  Fri Sep 02, 2017  4:16 PM Admitting Physician: Marguerite Olea THAW DAR [[id number]]   Diagnosis: Colitis [604540]   Estimated Length of Stay: < 2 midnights   Tentative Discharge Plan?: Home or Self Care [1]   Patient Class: Observation [104]             New Prescriptions    No medications on file                 Toni Arthurs, DO  09/02/17 1601       Toni Arthurs, DO  09/02/17 1617

## 2017-09-02 NOTE — H&P (Signed)
[facility] HOSPITALIST  H&P    Patient Info:   Date/Time: 09/02/2017 / 4:16 PM   Admit Date:09/02/2017  Patient Name:Christopher Trevino   ZOX:[id number]   PCP: Daisey Must, DO  Attending Physician: S. Aye      Assessment and Plan:     1. Lower abdominal discomfort with diarrhea with CT scan showing colitis. Mild leukocytosis recently had tonsillectomy.  No overt fever. Was on Augmentin for prior to tonsillectomy. Patient very stable with normal H and H and vital signs, but anxious about going home for these symptoms and we will observe overnight. Assessment mild leukocytosis on admission, but this could be reactionary.  No overt fevers or signs of sepsis or sirs   Hold any antibiotics until stool cultures and C. difficile are obtained unless patient spikes a fever.   Continue to monitor H and H every 12 hours   Gentle IV fluids.     Started on IV Protonix.   Start on clear liquids and advance diet as tolerated.   Give Bentyl IM now to help with abdominal cramping    He has no history of ulcerative or Crohn's disease.  No family history. I told patient he will need to have a workup done outpatient by gastrointestinal   Monitor complete blood count and for spiking temps    2.  Status post tonsillectomy on 08/31/17 by Dr. Rondel Jumbo. Was on Augmentin for 3 days prior to procedure.  Has really had poor by mouth intake since then and is on hydrocodone every 6 hours for pain management.    Unfortunately, he is was liquid form of hydrocodone which is nonformulary here, so we will put on sublingual morphine 5mg   every 6 hours as needed (as of note the patient has a very low pain threshold)    3. History of migraines, which we will order Imitrex as needed.  No current complaints at the bedside    4.  Hyperglycemia which is stress-induced.  Continue to monitor      DVT Prophylaxis: Low VTE and bloody stools    Code Status: Full  Disposition: Home  Type of Admission:Observation  Estimated Length of Stay (including stay  in the ER receiving treatment): less than 2 midnights   Foley and Central Lines: none  Medical Necessity for stay: IVF and monitoring   Milestones: Improving symptoms with no bloody diarrhea, stable H and H.  No fever    Clinical Presentation:   History of Presenting Illness:   Christopher Trevino is a 42 y.o. male who has history of   Past Surgical History:   Procedure Laterality Date   . INCISION & DRAINAGE, RETROPHARYNGEAL ABSCESS Left 07/01/2017    Procedure: INCISION & DRAINAGE, RETROPHARYNGEAL ABSCESS;  Surgeon: Francis Gaines, MD;  Location: [facility];  Service: ENT;  Laterality: Left;   . NASAL SEPTUM SURGERY  AGE 22   . TONSILLECTOMY N/A 08/31/2017    Procedure: TONSILLECTOMY AND EXCISION OF FRENULUM LESION;  Surgeon: Francis Gaines, MD;  Location: [facility];  Service: ENT;  Laterality: N/A;        Past Medical History:   Diagnosis Date   . History of migraine     LAST MIGRANE Aug 17 2017  EVEN 1 BEER OR GLASS OF WINE BRINGS IT ON       This 42 year old Caucasian male with past medical history as seen above comes into the ER complaining of abdominal pain with bloody diarrhea.  States H and P  obtained from patient and wife at the bedside. She was admitted in the beginning of January for a peritonsillar abscess and finally had an elective tonsillectomy done by [facility] on 08/31/17 reoccurring infections. For the last few months he has been on and off antibiotics including prophylactic Augmentin for 3 days prior to surgery.  He had no diarrhea or any gastrointestinal upset with these antibiotics since his surgery.  Has had poor by mouth intake and has felt constipated, especially on hydrocodone. He did a small bowel movement last night but this morning when he woke up, had bright red diarrhea about 4-5 episodes with lower abdominal cramping and with extensive bloody diarrhea.  He became concerned and came to the ER for evaluation.  CT scan is showing colitis.  Patient has no past medical history of  ulcerative colitis, Crohn's or had a gastrointestinal workup. Patient denies any unusual foods, fever, chills, or any other abdominal symptoms. Plating of throat discomfort as well as abdominal cramping of the time of my exam, but no other acute issues    Review of Systems:   A comprehensive review of systems was negative except for the underlying highlighted in Bold     Constitutional: chills, weight loss, malaise/fatigue and diaphoresis.   HENT: hearing loss, ear pain, nosebleeds, congestion, sore throat, neck pain, tinnitus and ear discharge.   Eyes: blurred vision, double vision, photophobia, pain, discharge and redness.   Respiratory: cough, hemoptysis, sputum production, shortness of breath, wheezing and stridor.   Cardiovascular: chest pain, palpitations, orthopnea, claudication, leg swelling and PND.   Gastrointestinal: heartburn, nausea, vomiting, abdominal pain, diarrhea, constipation, blood in stool and melena.   Genitourinary: dysuria, urgency, frequency, hematuria and flank pain.   Musculoskeletal: myalgias, back pain, joint pain and falls.   Skin: itching and rash.   Neurological: dizziness, tingling, tremors, sensory change, speech change, focal weakness, seizures, loss of consciousness, weakness and headaches.   Endo/Heme/Allergies: environmental allergies and polydipsia. Does not bruise/bleed easily.   Psychiatric/Behavioral: depression, suicidal ideas, hallucinations, memory loss and substance abuse. The patient is nervous/anxious and does not have insomnia.     Physical Exam:     Vitals:    09/02/17 1400 09/02/17 1500 09/02/17 1657 09/02/17 1700   BP: (!) 132/93 (!) 136/95 140/87 (!) 134/94   Pulse:       Resp:       Temp:       TempSrc:       SpO2: 96% 96% 95% 95%   Weight:       Height:           Constitutional: Patient is oriented to person, place, and time. Patient appears well-developed and well-nourished.   Head: Normocephalic and atraumatic.  Eyes- pupils equal and reactive, extraocular  eye movements intact, sclera anicteric  Ears - external ear canals normal, right ear normal, left ear normal  Nose - normal and patent, no erythema, discharge or polyps and normal nontender sinuses  Mouth - mucous membranes moist, pharynx normal without lesions, mild erythema/edema which is normal post tonsillectomy  Neck: Normal range of motion. Neck supple. No JVD present. No bruit or thrill noted on bilateral carotids.  Cardiovascular: Normal rate, regular rhythm, normal heart sounds and intact distal pulses. Exam reveals no gallop and no friction rub. No murmur heard.  Pulmonary/Chest: Effort normal and breath sounds normal. No stridor. No respiratory distress. Patient has no wheezes. No crackles were present. Exhibits no tenderness on palpation.   Abdominal: Soft. Bowel sounds are  normal. Patient exhibits no distension and no mass was palpable. There is bilateral abdominal tenderness. There is no rebound and no guarding.   Musculoskeletal: Normal range of motion. Patient exhibits no edema and no tenderness.   Lymphadenopathy: Patient has no cervical adenopathy.   Neurological: Patient is alert and oriented to person, place, and time and has normal reflexes. No cranial nerve deficit. Normal muscle tone.  Skin: Skin is warm. No rash noted. Patient is not diaphoretic. No erythema. No pallor.   Psychiatric: Has normal mood and affect. Behavior is normal. Judgment and thought content normal    Physical Exam  Clinical Information and History:   Chief Complaint:  Chief Complaint   Patient presents with   . Rectal Bleeding   . Abdominal Pain     Past Medical History:  Past Medical History:   Diagnosis Date   . History of migraine     LAST MIGRANE Aug 17 2017  EVEN 1 BEER OR GLASS OF WINE BRINGS IT ON     Past Surgical History:  Past Surgical History:   Procedure Laterality Date   . INCISION & DRAINAGE, RETROPHARYNGEAL ABSCESS Left 07/01/2017    Procedure: INCISION & DRAINAGE, RETROPHARYNGEAL ABSCESS;  Surgeon:  Francis Gaines, MD;  Location: [facility];  Service: ENT;  Laterality: Left;   . NASAL SEPTUM SURGERY  AGE 35   . TONSILLECTOMY N/A 08/31/2017    Procedure: TONSILLECTOMY AND EXCISION OF FRENULUM LESION;  Surgeon: Francis Gaines, MD;  Location: [facility];  Service: ENT;  Laterality: N/A;     Family History:  Family History   Problem Relation Age of Onset   . Hyperlipidemia Mother    . Hypertension Mother    . Hyperlipidemia Father    . Hypertension Father      Social History:  History   Alcohol Use   . 0.0 - 1.2 oz/week     Comment: USED TO DO 6 BEE A WK DECREASED Jun 24 2017     History   Drug Use No     History   Smoking Status   . Former Smoker   . Packs/day: 0.50   . Years: 3.00   . Quit date: 05/10/1999   Smokeless Tobacco   . Never Used     Social History     Social History   . Marital status: Married     Spouse name: N/A   . Number of children: N/A   . Years of education: N/A     Social History Main Topics   . Smoking status: Former Smoker     Packs/day: 0.50     Years: 3.00     Quit date: 05/10/1999   . Smokeless tobacco: Never Used   . Alcohol use 0.0 - 1.2 oz/week      Comment: USED TO DO 6 BEE A WK DECREASED Jun 24 2017   . Drug use: No   . Sexual activity: Not Asked     Other Topics Concern   . None     Social History Narrative   . None     Allergies:No Known Allergies  Medications: See med rec  Results of Labs/imaging   Labs have been reviewed:   Coagulation Profile:   Recent Labs  Lab 09/02/17  1400   PT 12.9   PT INR 1.0   PTT 26       CBC review:   Recent Labs  Lab 09/02/17  1400   WBC 12.25*  Hgb 15.8   Hematocrit 43.9   Platelets 198   MCV 87.8   RDW 11   Neutrophils 84.1   Lymphocytes Automated 6.3   Eosinophils Automated 0.0   Immature Granulocyte 0.4   Neutrophils Absolute 10.31*   Absolute Immature Granulocyte 0.05     Chem Review:  Recent Labs  Lab 09/02/17  1400   Sodium 141   Potassium 4.7   Chloride 104   CO2 24   BUN 10.6   Creatinine 0.8   Glucose 108*   Calcium 9.3    Bilirubin, Total 0.9   AST (SGOT) 20   ALT 14   Alkaline Phosphatase 65     Results     Procedure Component Value Units Date/Time    PT/APTT [[id number]] Collected:  09/02/17 1400     Updated:  09/02/17 1441     PT 12.9 sec      PT INR 1.0     PT Anticoag. Given Within 48 hrs. None     PTT 26 sec     Comprehensive metabolic panel [[id number]]  (Abnormal) Collected:  09/02/17 1400    Specimen:  Blood Updated:  09/02/17 1438     Glucose 108 (H) mg/dL      BUN 78.2 mg/dL      Creatinine 0.8 mg/dL      Sodium 956 mEq/L      Potassium 4.7 mEq/L      Chloride 104 mEq/L      CO2 24 mEq/L      Calcium 9.3 mg/dL      Protein, Total 6.9 g/dL      Albumin 3.8 g/dL      AST (SGOT) 20 U/L      ALT 14 U/L      Alkaline Phosphatase 65 U/L      Bilirubin, Total 0.9 mg/dL      Globulin 3.1 g/dL      Albumin/Globulin Ratio 1.2     Anion Gap 13.0    GFR [[id number]] Collected:  09/02/17 1400     Updated:  09/02/17 1438     EGFR >60.0    CBC with differential [[id number]]  (Abnormal) Collected:  09/02/17 1400    Specimen:  Blood from Blood Updated:  09/02/17 1418     WBC 12.25 (H) x10 3/uL      Hgb 15.8 g/dL      Hematocrit 41.3 %      Platelets 198 x10 3/uL      RBC 5.00 x10 6/uL      MCV 87.8 fL      MCH 31.6 pg      MCHC 36.0 (H) g/dL      RDW 11 %      MPV 11.5 fL      Neutrophils 84.1 %      Lymphocytes Automated 6.3 %      Monocytes 9.0 %      Eosinophils Automated 0.0 %      Basophils Automated 0.2 %      Immature Granulocyte 0.4 %      Nucleated RBC 0.0 /100 WBC      Neutrophils Absolute 10.31 (H) x10 3/uL      Abs Lymph Automated 0.77 x10 3/uL      Abs Mono Automated 1.10 (H) x10 3/uL      Abs Eos Automated 0.00 x10 3/uL      Absolute Baso Automated 0.02 x10 3/uL      Absolute Immature Granulocyte 0.05  x10 3/uL      Absolute NRBC 0.00 x10 3/uL         Radiology reports have been reviewed:  Radiology Results (24 Hour)     Procedure Component Value Units Date/Time    CT Abdomen Pelvis W IV And PO Cont [[id number]] Collected:   09/02/17 1550    Order Status:  Completed Updated:  09/02/17 1556    Narrative:       Patient: Lower abdominal pain.    Procedure: Contrast-enhanced CT of the abdomen and pelvis. 100 mL of  Omnipaque 350 were administered intravenously. Oral contrast material is  also present.    Findings: There is moderate to severe diffuse thickening of the colonic  wall consistent with colitis. Some fluid is present in the rectum.  Correlate for diarrhea. No dilated bowel loops. No free intraperitoneal  gas or free fluid. The appendix is seen extending superomedially from  the cecal tip. No evidence of appendicitis.    Unremarkable liver and gallbladder. No dilatation of the biliary tree.  Unremarkable spleen, pancreas, adrenals, kidneys, and abdominal aorta.  Normal cardiac size. Included portions of the lungs are unremarkable.      Impression:        Colitis.    Wynema Birch, MD   09/02/2017 3:52 PM        EKG:   Last EKG Result     None        Hospitalist   Signed by:   Danella Maiers Renkes  09/02/2017 4:16 PM    *This note was generated by the Epic EMR system/ Dragon speech recognition and may contain inherent errors or omissions not intended by the user. Grammatical errors, random word insertions, deletions, pronoun errors and incomplete sentences are occasional consequences of this technology due to software limitations. Not all errors are caught or corrected. If there are questions or concerns about the content of this note or information contained within the body of this dictation they should be addressed directly with the author for clarification

## 2017-09-02 NOTE — Discharge Instructions (Signed)
Colitis, Nonspecific    You have been diagnosed with colitis.     "Colitis" means inflammation of the colon. The colon is also called the large intestine.     There are many causes of colitis. These include infection and autoimmune reactions. It is not clear what is causing your illness right now.     There are different symptoms of colitis. Most people have abdominal (belly) pain and diarrhea (loose stool). Some people have blood in the stools. Sometimes people always feel like they need to have a bowel movement (stool.) Sometimes there may be a fever (temperature higher than 100.4F / 38C).     Tests are needed to find the cause of colitis. These could be a blood test or CT scan. Sometimes a special test called a "colonoscopy" is needed. This is when a small camera is put into the bowels to examine them. Follow up with all testing recommended today.     People with diarrhea often lose a lot of body fluids. This causes dehydration. Drink a lot of water or other fluids to stay hydrated. You may also be given medicine for your diarrhea. It will slow the diarrhea and stop the vomiting (if you have this symptom).    You should drink lots of natural juices or a sports-type drink that has electrolytes (sodium, potassium, etc). Do not drink a lot of plain water. Sugary drinks like apple and pear juice might make the diarrhea worse.    You might be given medicine to help you with your diarrhea, nausea (feeling sick), vomiting and stomach cramps. This will depend on your age, medical history and symptoms.    It is OK for you to go home today.    It is very important that you follow up with your regular doctor or a specialist. The doctor will tell you how soon this follow-up needs to be.    YOU SHOULD SEEK MEDICAL ATTENTION IMMEDIATELY, EITHER HERE OR AT THE NEAREST EMERGENCY DEPARTMENT, IF ANY OF THE FOLLOWING OCCURS:   You are vomiting and cannot keep down fluids.   You have symptoms of dehydration. These  include dry mouth, not urinating at least once every eight hours, feeling dizzy/lightheaded, severe weakness or passing out.   Your belly pain gets worse.   You have fever (temperature higher than 100.4F / 38C).              Rectal Bleeding    You have been diagnosed with rectal bleeding.    Blood in the stool is common. Most often the cause is hemorrhoids or small tears in the anal area. There are many other causes of blood in the stool. These include inflammation or infection in the bowel or stomach or bowel ulcers. Rectal bleeding may also be due to serious problems like colon or rectal cancer.    Take any medicines prescribed for you.    Increase the fiber or bulk in your diet. Choose foods high in fiber like fruits, vegetables and whole grain breads. Powders or supplements may be prescribed for you.    Rectal bleeding may signal a serious problem. Follow up with a doctor as directed.    YOU SHOULD SEEK MEDICAL ATTENTION IMMEDIATELY, EITHER HERE OR AT THE NEAREST EMERGENCY DEPARTMENT, IF ANY OF THE FOLLOWING OCCURS:   Bleeding gets worse.   Abdominal (belly) pain develops.   You vomit constantly or vomit blood or material that looks like "coffee grounds."   Fever (temperature higher than 100.4F /   38C), or shaking chills develop.   You get lightheaded or short of breath.

## 2017-09-02 NOTE — [facility] Notes (Signed)
OBS Review 09/02/17    42 yr old presented to ED on 09/02/17 and placed on OBS status @ 1616 for colitis        ED Summary:      Assessment:  -year-old male with abdominal pain and rectal bleeding. Describes 3-4 bouts of rectal bleeding without stool. Describes it as bright blood. Also has pain in the bilateral lower abdomen. Had a tonsillectomy 2 days ago    VS: t 98.8, HR 98, RR 16, 139/97, pain 5/10    Labs: wbc 12.25, glucose 108    CT abd/pelvis:       Colitis.        ED Meds:  NS bolus  PO & IV contrast for CT  Fentanyl IV  zofran 4mg  IV                    OBS status    Medicine H/P:  1. Lower abdominal discomfort with diarrhea with CT scan showing colitis. Mild leukocytosis recently had tonsillectomy.  No overt fever. Was on Augmentin for prior to tonsillectomy. Patient very stable with normal H and H and vital signs, but anxious about going home for these symptoms and we will observe overnight. Assessment mild leukocytosis on admission, but this could be reactionary.  No overt fevers or signs of sepsis or sirs   Hold any antibiotics until stool cultures and C. difficile are obtained unless patient spikes a fever.   Continue to monitor H and H every 12 hours   Gentle IV fluids.     Started on IV Protonix.   Start on clear liquids and advance diet as tolerated.   Give Bentyl IM now to help with abdominal cramping    He has no history of ulcerative or Crohn's disease.  No family history. I told patient he will need to have a workup done outpatient by gastrointestinal   Monitor complete blood count and for spiking temps    2.  Status post tonsillectomy on 08/31/17 by Dr. Rondel Jumbo. Was on Augmentin for 3 days prior to procedure.  Has really had poor by mouth intake since then and is on hydrocodone every 6 hours for pain management.    Unfortunately, he is was liquid form of hydrocodone which is nonformulary here, so we will put on sublingual morphine 5mg   every 6 hours as needed (as  of note the patient has a very low pain threshold)    Orders:  NS @ 134ml/hr  Clear liquid diet  Contact special isolation  CMP, CBC draws    DX:  Rectal bleeding    Colitis

## 2017-09-02 NOTE — UM Notes (Signed)
UR Review 09/02/17    42 yr old presented to ED on 09/02/17 and placed on OBS status @ 1616 for colitis        MD Summary:      Assessment:  -year-old male with abdominal pain and rectal bleeding.  Describes 3-4 bouts of rectal bleeding without stool.  Describes it as bright blood.  Also has pain in the bilateral lower abdomen.  Had a tonsillectomy 2 days ago    VS: t 98.8, HR 98, RR 16, 139/97, pain 5/10    Labs: wbc 12.25, glucose 108    CT abd/pelvis:       Colitis.        ED Meds:  NS bolus  PO & IV contrast for CT  Fentanyl IV  zofran 4mg  IV    DX:  Rectal bleeding    Colitis

## 2017-09-02 NOTE — ED Notes (Signed)
Christopher Trevino, 42 yo M.  Colitis, rectal bleed.  Contact precautions, r/o c. Diff, no assist.  [facility]

## 2017-09-03 DIAGNOSIS — R1030 Lower abdominal pain, unspecified: Secondary | ICD-10-CM

## 2017-09-03 DIAGNOSIS — Z8669 Personal history of other diseases of the nervous system and sense organs: Secondary | ICD-10-CM

## 2017-09-03 DIAGNOSIS — K529 Noninfective gastroenteritis and colitis, unspecified: Principal | ICD-10-CM

## 2017-09-03 LAB — COMPREHENSIVE METABOLIC PANEL
ALT: 12 U/L (ref 0–55)
AST (SGOT): 15 U/L (ref 5–34)
Albumin/Globulin Ratio: 1.2 (ref 0.9–2.2)
Albumin: 3.2 g/dL — ABNORMAL LOW (ref 3.5–5.0)
Alkaline Phosphatase: 56 U/L (ref 38–106)
Anion Gap: 7 (ref 5.0–15.0)
BUN: 8.6 mg/dL — ABNORMAL LOW (ref 9.0–28.0)
Bilirubin, Total: 1.2 mg/dL (ref 0.2–1.2)
CO2: 28 mEq/L (ref 22–29)
Calcium: 9 mg/dL (ref 8.5–10.5)
Chloride: 105 mEq/L (ref 100–111)
Creatinine: 0.8 mg/dL (ref 0.7–1.3)
Globulin: 2.6 g/dL (ref 2.0–3.6)
Glucose: 101 mg/dL — ABNORMAL HIGH (ref 70–100)
Potassium: 3.9 mEq/L (ref 3.5–5.1)
Protein, Total: 5.8 g/dL — ABNORMAL LOW (ref 6.0–8.3)
Sodium: 140 mEq/L (ref 136–145)

## 2017-09-03 LAB — CBC
Absolute NRBC: 0 10*3/uL (ref 0.00–0.00)
Hematocrit: 41.2 % (ref 37.6–49.6)
Hgb: 14.6 g/dL (ref 12.5–17.1)
MCH: 31.4 pg (ref 25.1–33.5)
MCHC: 35.4 g/dL (ref 31.5–35.8)
MCV: 88.6 fL (ref 78.0–96.0)
MPV: 11.3 fL (ref 8.9–12.5)
Nucleated RBC: 0 /100 WBC (ref 0.0–0.0)
Platelets: 181 10*3/uL (ref 142–346)
RBC: 4.65 10*6/uL (ref 4.20–5.90)
RDW: 11 % (ref 11–15)
WBC: 11.49 10*3/uL — ABNORMAL HIGH (ref 3.10–9.50)

## 2017-09-03 LAB — GFR: EGFR: >60

## 2017-09-03 LAB — STOOL FOR SALMONELLA,SHIGELLA,CAMPYLOBACTER AND SHIGA TOXIN PCR
Stool Campylobacter jejunii/coli by PCR: NEGATIVE
Stool Salmonella Species by PCR: NEGATIVE
Stool Shiga Toxin by PCR: NEGATIVE
Stool Shigella Species/Enteroinvasive Escherichia coli PCR: NEGATIVE

## 2017-09-03 LAB — SEDIMENTATION RATE: Sed Rate: 12 mm/Hr (ref 0–15)

## 2017-09-03 LAB — CLOSTRIDIUM DIFFICILE TOXIN B PCR: Stool Clostridium difficile Toxin B PCR: NEGATIVE

## 2017-09-03 LAB — C-REACTIVE PROTEIN: C-Reactive Protein: 6.9 mg/dL — ABNORMAL HIGH (ref 0.0–0.8)

## 2017-09-03 MED ORDER — LEVOFLOXACIN IN D5W 750 MG/150ML IV SOLN
750.00 mg | INTRAVENOUS | Status: DC
Start: 2017-09-03 — End: 2017-09-03
  Administered 2017-09-03: 10:00:00 750 mg via INTRAVENOUS
  Filled 2017-09-03: qty 150

## 2017-09-03 MED ORDER — OXYCODONE HCL 5 MG PO TABS
5.00 mg | ORAL_TABLET | Freq: Four times a day (QID) | ORAL | Status: DC | PRN
Start: 2017-09-03 — End: 2017-09-04
  Administered 2017-09-03 – 2017-09-04 (×4): 5 mg via ORAL
  Filled 2017-09-03 (×4): qty 1

## 2017-09-03 MED ORDER — POTASSIUM CHLORIDE CRYS ER 20 MEQ PO TBCR
40.00 meq | EXTENDED_RELEASE_TABLET | Freq: Once | ORAL | Status: AC
Start: 2017-09-03 — End: 2017-09-03
  Administered 2017-09-03: 10:00:00 40 meq via ORAL
  Filled 2017-09-03: qty 2

## 2017-09-03 MED ORDER — GUAIFENESIN-DM 100-10 MG/5ML PO SYRP
5.00 mL | ORAL_SOLUTION | ORAL | Status: DC | PRN
Start: 2017-09-03 — End: 2017-09-04
  Administered 2017-09-03 (×2): 5 mL via ORAL
  Filled 2017-09-03 (×2): qty 5

## 2017-09-03 MED ORDER — METRONIDAZOLE IN NACL 500 MG/100 ML IV SOLN
500.00 mg | Freq: Three times a day (TID) | INTRAVENOUS | Status: DC
Start: 2017-09-03 — End: 2017-09-03
  Administered 2017-09-03: 14:00:00 500 mg via INTRAVENOUS
  Filled 2017-09-03: qty 100

## 2017-09-03 NOTE — Progress Notes (Signed)
[facility] Hospitalist Daily Progress Note        Date Time: 09/03/2017  1:42 PM  Patient Name:Christopher Trevino  ZOX:[id number]  PCP: Daisey Must, DO  Attending Physician:Avraj Lindroth Glenis Smoker M.D.      Chief Complaint:      Chief Complaint   Patient presents with   . Rectal Bleeding   . Abdominal Pain       Subjective:     Christopher Trevino feels slightly better than yesterday.  Abdominal pain is more in the lower part and is currently 4 out of 10 severity and dull , constant .  He reports 5 episodes of bright red blood per rectum since last night.  Denies any stool was mixed with the blood.  No further nausea, vomiting, tolerating liquids  Denies any chest pain, cough, shortness of breath  No new weakness tingling or numbness.  Assessment/Plan   Acute colitis: Cause unclear, infectious versus inflammatory.  Patient recently treated for retropharyngeal abscess with antibiotics, but stool C. difficile is negative.  Stool Campylobacter, Shigella and Salmonella also negative.  We will continue IV fluid and supportive treatment.  Continue antibiotics and await infectious disease input  Rectal bleeding: Most likely secondary to above  Hb is currently stable despite of rectal bleeding  Consult gastroenterology  Check ESR and CRP  Daily complete blood count and transfuse as needed  Status post tonsillectomy on 08/31/17 by Dr. Rondel Jumbo  Patient has been seen by her this morning.  Outpatient follow-up  History of migraines: Currently stable.  Treat as needed       DVT Prohylaxis:Sequential compression devices  Code Status: Full Code   Prognosis: Guarded  Type of Admission:Inpatient  Central Line/Foley Catheter/PICC line status: none  Estimated Length of Stay (including stay in the ER receiving treatment): > 2 midnights  Medical Necessity for stay:Acute colitis, rectal bleeding   Expected Date of Discharge: 1-2 days   Allergies:   No Known Allergies    Physical Exam:   Vitals  reviewed:   height is 1.702 m (5\' 7" ) and weight is 65.9 kg (145 lb 4.5 oz). His oral temperature is 99.1 F (37.3 C). His blood pressure is 127/88 and his pulse is 111 (abnormal). His respiration is 17 and oxygen saturation is 97%.   Body mass index is 22.75 kg/m.  Vitals:    09/02/17 2210 09/03/17 0448 09/03/17 0901 09/03/17 1312   BP:  139/90 (!) 133/91 127/88   Pulse:  94 97 (!) 111   Resp:  17 15 17    Temp:  99.4 F (37.4 C) 97.4 F (36.3 C) 99.1 F (37.3 C)   TempSrc: Oral Oral Oral Oral   SpO2:  97% 98% 97%   Weight:       Height:         Intake and Output Summary (Last 24 hours) at Date Time    Intake/Output Summary (Last 24 hours) at 09/03/17 1342  Last data filed at 09/03/17 0958   Gross per 24 hour   Intake             2678 ml   Output              600 ml   Net             2078 ml       Exam  Awake Alert, Oriented *3, No new F.N deficits, Normal affect  NC.AT,PERRAL  Supple Neck,No JVD, No cervical lymphadenopathy appriciated.   Symmetrical  Chest wall movement, Good air movement bilaterally, CTAB  RRR,No Gallops,Rubs or new Murmurs, No Parasternal Heave  +ve B.Sounds, Abd Soft, Non tender,, diffuse lower abdominal tenderness to deep palpation, No rebound -guarding or rigidity.  No Cyanosis, Clubbing or edema, No new Rash or bruise    Consult Input/Plan       Infectious disease and gastroenterology    Medications:     Current Facility-Administered Medications   Medication Dose Route Frequency Last Rate Last Dose   . 0.9%  NaCl infusion   Intravenous Continuous 125 mL/hr at 09/03/17 0953     . guaiFENesin-dextromethorphan (ROBITUSSIN DM) 100-10 MG/5ML syrup 5 mL  5 mL Oral Q4H PRN   5 mL at 09/03/17 0458   . lactobacillus/streptococcus (RISAQUAD) capsule 1 capsule  1 capsule Oral Daily   1 capsule at 09/03/17 0948   . levoFLOXacin (LEVAQUIN) 750mg  in D5W IVPB (premix)  750 mg Intravenous Q24H 100 mL/hr at 09/03/17 0948 750 mg at 09/03/17 0948   . metroNIDAZOLE (FLAGYL) 500 mg IVPB (premix)  500  mg Intravenous Q8H SCH 100 mL/hr at 09/03/17 1334 500 mg at 09/03/17 1334   . morphine injection 2 mg  2 mg Intravenous Q4H PRN   2 mg at 09/03/17 1152   . naloxone (NARCAN) injection 0.2 mg  0.2 mg Intravenous PRN       . oxyCODONE (ROXICODONE) immediate release tablet 5 mg  5 mg Oral Q6H PRN   5 mg at 09/03/17 1304   . pantoprazole (PROTONIX) injection 40 mg  40 mg Intravenous Daily   40 mg at 09/03/17 0948   . SUMAtriptan (IMITREX) tablet 100 mg  100 mg Oral Q2H PRN              Labs:reviewed     Results     Procedure Component Value Units Date/Time    Comprehensive metabolic panel [[id number]]  (Abnormal) Collected:  09/03/17 0734    Specimen:  Blood Updated:  09/03/17 0827     Glucose 101 (H) mg/dL      BUN 8.6 (L) mg/dL      Creatinine 0.8 mg/dL      Sodium 409 mEq/L      Potassium 3.9 mEq/L      Chloride 105 mEq/L      CO2 28 mEq/L      Calcium 9.0 mg/dL      Protein, Total 5.8 (L) g/dL      Albumin 3.2 (L) g/dL      AST (SGOT) 15 U/L      ALT 12 U/L      Alkaline Phosphatase 56 U/L      Bilirubin, Total 1.2 mg/dL      Globulin 2.6 g/dL      Albumin/Globulin Ratio 1.2     Anion Gap 7.0    GFR [[id number]] Collected:  09/03/17 0734     Updated:  09/03/17 0827     EGFR >60.0    CBC without differential [[id number]]  (Abnormal) Collected:  09/03/17 0734    Specimen:  Blood from Blood Updated:  09/03/17 0805     WBC 11.49 (H) x10 3/uL      Hgb 14.6 g/dL      Hematocrit 57.8 %      Platelets 181 x10 3/uL      RBC 4.65 x10 6/uL      MCV 88.6 fL      MCH 31.4 pg      MCHC 35.4 g/dL  RDW 11 %      MPV 11.3 fL      Nucleated RBC 0.0 /100 WBC      Absolute NRBC 0.00 x10 3/uL     Stool for Salm/Shig/Campy/Shiga PCR [[id number]] Collected:  09/02/17 1655    Specimen:  Stool Updated:  09/03/17 0425     Stool Salmonella Species by PCR Negative     Stl Shigella Species/Enteroinvasive Escherichia Coli PCR Negative     Stool Campylobacter Jejunii/Coli by PCR Negative     Stool Shiga Toxin by PCR Negative    Stool for C. Diff.  [[id number]] Collected:  09/02/17 1655    Specimen:  Stool from Stool Updated:  09/03/17 0344     Clostridium Difficile Toxin B PCR Negative    Narrative:       Patient's current admission began:->less than or equal to 3  days ago  Cancel C-Diff order if no diarrhea in 12 hours?->Yes    Hemoglobin and hematocrit, blood [[id number]] Collected:  09/02/17 2205    Specimen:  Blood Updated:  09/02/17 2210     Hgb 15.5 g/dL      Hematocrit 08.6 %     Lactic acid, plasma [[id number]] Collected:  09/02/17 1814    Specimen:  Blood Updated:  09/02/17 1829     Lactic acid 0.6 mmol/L     Narrative:       With next blood draw    PT/APTT [[id number]] Collected:  09/02/17 1400     Updated:  09/02/17 1441     PT 12.9 sec      PT INR 1.0     PT Anticoag. Given Within 48 hrs. None     PTT 26 sec     Comprehensive metabolic panel [[id number]]  (Abnormal) Collected:  09/02/17 1400    Specimen:  Blood Updated:  09/02/17 1438     Glucose 108 (H) mg/dL      BUN 64.4 mg/dL      Creatinine 0.8 mg/dL      Sodium 034 mEq/L      Potassium 4.7 mEq/L      Chloride 104 mEq/L      CO2 24 mEq/L      Calcium 9.3 mg/dL      Protein, Total 6.9 g/dL      Albumin 3.8 g/dL      AST (SGOT) 20 U/L      ALT 14 U/L      Alkaline Phosphatase 65 U/L      Bilirubin, Total 0.9 mg/dL      Globulin 3.1 g/dL      Albumin/Globulin Ratio 1.2     Anion Gap 13.0    GFR [[id number]] Collected:  09/02/17 1400     Updated:  09/02/17 1438     EGFR >60.0    CBC with differential [[id number]]  (Abnormal) Collected:  09/02/17 1400    Specimen:  Blood from Blood Updated:  09/02/17 1418     WBC 12.25 (H) x10 3/uL      Hgb 15.8 g/dL      Hematocrit 18.8 %      Platelets 198 x10 3/uL      RBC 5.00 x10 6/uL      MCV 87.8 fL      MCH 31.6 pg      MCHC 36.0 (H) g/dL      RDW 11 %      MPV 11.5 fL      Neutrophils 84.1 %  Lymphocytes Automated 6.3 %      Monocytes 9.0 %      Eosinophils Automated 0.0 %      Basophils Automated 0.2 %      Immature Granulocyte 0.4 %      Nucleated RBC 0.0  /100 WBC      Neutrophils Absolute 10.31 (H) x10 3/uL      Abs Lymph Automated 0.77 x10 3/uL      Abs Mono Automated 1.10 (H) x10 3/uL      Abs Eos Automated 0.00 x10 3/uL      Absolute Baso Automated 0.02 x10 3/uL      Absolute Immature Granulocyte 0.05 x10 3/uL      Absolute NRBC 0.00 x10 3/uL           Rads:reviewed   Radiological Procedure reviewed.  Radiology Results (24 Hour)     Procedure Component Value Units Date/Time    CT Abdomen Pelvis W IV And PO Cont [[id number]] Collected:  09/02/17 1550    Order Status:  Completed Updated:  09/02/17 1556    Narrative:       Patient: Lower abdominal pain.    Procedure: Contrast-enhanced CT of the abdomen and pelvis. 100 mL of  Omnipaque 350 were administered intravenously. Oral contrast material is  also present.    Findings: There is moderate to severe diffuse thickening of the colonic  wall consistent with colitis. Some fluid is present in the rectum.  Correlate for diarrhea. No dilated bowel loops. No free intraperitoneal  gas or free fluid. The appendix is seen extending superomedially from  the cecal tip. No evidence of appendicitis.    Unremarkable liver and gallbladder. No dilatation of the biliary tree.  Unremarkable spleen, pancreas, adrenals, kidneys, and abdominal aorta.  Normal cardiac size. Included portions of the lungs are unremarkable.      Impression:        Colitis.    Wynema Birch, MD   09/02/2017 3:52 PM          Multi-Disciplinary Care Input:   Case management notes: No Patient Care Coordination Note on file.     PT/OT/ST notes:          Signed by: Anselmo Pickler  09/03/2017 1:42 PM         This note was generated by the Epic EMR system/ Dragon speech recognition and may contain inherent errors or omissions not intended by the user. Grammatical errors, random word insertions, deletions, pronoun errors and incomplete sentences are occasional consequences of this technology due to software limitations. Not all errors are caught or corrected. If  there are questions or concerns about the content of this note or information contained within the body of this dictation they should be addressed directly with the author for clarification

## 2017-09-03 NOTE — Consults (Signed)
INFECTIOUS DISEASE CONSULT  Alfonzo Beers, MD, FACP          Date Time: 09/03/17 4:01 PM  Patient Name: Christopher Trevino  Referring Physician: Anselmo Pickler, MD; Marguerite Olea, MD      Reason for consult:     Colitis    History of present illness:     Christopher Trevino ZOX:[id number],XBJ:[id number] is a 42 y.o. male, with history significant for migraines, and depression, who was recently admitted with tonsillitis and peritonsillar abscess, underwent tonsillectomy on sixth of March admitted through the emergency room with one day history of abdominal pain and bloody diarrhea. His pain was lower abdominal, 10/10, crampy, non-radiating, and no relieving factors, associated with, bloody diarrhea. He came to the emergency room has some leukocytosis and CT scan was consistent with colitis. She was started on Levaquin, and Flagyl. His stool studies are unremarkable. Denies any hematemesis or hemoptysis. Denies any seizure activity.    Review of systems:     Constitutional:  Denies any fevers, chills, malaise/fatigue, weight loss and diaphoresis.  HEENT:  Complains of some sore throat. Denies any blurred vision, double vision, photophobia, pain, discharge and redness.   Respiratory:  Denies any cough, hemoptysis, sputum production, shortness of breath, wheezing and stridor.   Cardiovascular: Denies any chest pain, palpitations, orthopnea, claudication, leg swelling.   Gastrointestinal:  Complains of abdominal pain, bloody diarrhea  Genitourinary: Denies any dysuria, urgency, frequency, hematuria and flank pain.   Musculoskeletal: Denies any myalgias, back pain, joint pain and falls.   Skin: Denies any itching and rash.   Neurological: Denies any dizziness, tingling, tremors, sensory change, speech change, focal weakness, seizures, loss of consciousness, weakness and headaches.   Endo/Heme/Allergies: Denies any environmental allergies and polydipsia. Does not bruise/bleed easily.   Psychiatric/Behavioral:  History  depression  Other review of systems are noncontributory.    Allergies:     No Known Allergies    Past medical history:     Past Medical History:   Diagnosis Date   . History of migraine     LAST MIGRANE Aug 17 2017  EVEN 1 BEER OR GLASS OF WINE BRINGS IT ON       Past surgical history:     Past Surgical History:   Procedure Laterality Date   . INCISION & DRAINAGE, RETROPHARYNGEAL ABSCESS Left 07/01/2017    Procedure: INCISION & DRAINAGE, RETROPHARYNGEAL ABSCESS;  Surgeon: Francis Gaines, MD;  Location: [place] [facility];  Service: ENT;  Laterality: Left;   . NASAL SEPTUM SURGERY  AGE 48   . TONSILLECTOMY N/A 08/31/2017    Procedure: TONSILLECTOMY AND EXCISION OF FRENULUM LESION;  Surgeon: Francis Gaines, MD;  Location: [facility];  Service: ENT;  Laterality: N/A;       Family history:     Family History   Problem Relation Age of Onset   . Hyperlipidemia Mother    . Hypertension Mother    . Hyperlipidemia Father    . Hypertension Father        Social history:     History   Alcohol Use   . 0.0 - 1.2 oz/week     Comment: USED TO DO 6 BEE A WK DECREASED Jun 24 2017     History   Drug Use No     History   Smoking Status   . Former Smoker   . Packs/day: 0.50   . Years: 3.00   . Quit date: 05/10/1999   Smokeless Tobacco   .  Never Used       Medications:     Current Facility-Administered Medications   Medication Dose Route Frequency   . lactobacillus/streptococcus  1 capsule Oral Daily   . pantoprazole  40 mg Intravenous Daily       Physical Exam:     Blood pressure 127/88, pulse (!) 111, temperature 99.1 F (37.3 C), temperature source Oral, resp. rate 17, height 1.702 m (5\' 7" ), weight 65.9 kg (145 lb 4.5 oz), SpO2 97 %.    General Appearance: Comfortable, well-appearing and in no acute distress.   HEENT: Pallor negative, Anicteric sclera.   Neck: Supple, trachea midline, no adenopathy  Lungs: Normal respiratory rate and normal effort. Not in respiratory distress. Breath sounds clear to auscultation. No  wheezes, rales, rhonchi.   Chest Wall: Symmetric chest wall expansion.   Heart : S1 and S2. no murmurs, rub, gallop  Abdomen: Abdomen is soft. Mild diffuse discomfort  Neurological: Alert and oriented to person, place and time. Normal strength. No gross defect.   Extremities: Normal range of motion.  Skin: Warm and dry. No rash or ecchymosis   Psychiatric:  Looks anxious    Labs:     Recent Labs      09/03/17   0734  09/02/17   2205  09/02/17   1400   WBC  11.49*   --   12.25*   Hgb  14.6  15.5  15.8   Hematocrit  41.2  43.3  43.9   Platelets  181   --   198   MCV  88.6   --   87.8       Recent Labs      09/03/17   0734  09/02/17   1400   Sodium  140  141   Potassium  3.9  4.7   Chloride  105  104   CO2  28  24   BUN  8.6*  10.6   Creatinine  0.8  0.8   Glucose  101*  108*   Calcium  9.0  9.3       Recent Labs      09/03/17   0734  09/02/17   1400   AST (SGOT)  15  20   ALT  12  14   Alkaline Phosphatase  56  65   Protein, Total  5.8*  6.9   Albumin  3.2*  3.8   Bilirubin, Total  1.2  0.9       Imaging studies:     CT: Colitis    Assessment :     Christopher Trevino is a 42 y.o. male, with:     Acute hemorrhagic colitis   Status post recent, tonsillectomy   Migraines     Recommendations:     I would like to suggest following approach:     Discontinue Levaquin   Discontinue Flagyl   GI follow-up   Repeat CBC, CMP tomorrow   ENT follow-up   Blood cultures if spikes more than 100.5    Continue supportive care   We'll start the antimicrobials according to the cultures and clinical course     I will follow this patient closely with you    Thank you [facility] / Dr.Jadoon for involving me in care of Christopher Trevino          Signed by: Alfonzo Beers, MD, FACP  Date Time: 09/03/17 4:01 PM      *This note was generated by the Epic  EMR system/ Dragon speech recognition and may contain inherent errors or omissions not intended by the user. Grammatical errors, random word insertions, deletions, pronoun errors and  incomplete sentences are occasional consequences of this technology due to software limitations. Not all errors are caught or corrected. If there are questions or concerns about the content of this note or information contained within the body of this dictation they should be addressed directly with the author for clarification

## 2017-09-03 NOTE — Plan of Care (Signed)
Problem: Safety  Goal: Patient will be free from injury during hospitalization  Outcome: Progressing   09/02/17 2333   Goal/Interventions addressed this shift   Patient will be free from injury during hospitalization  Assess patient's risk for falls and implement fall prevention plan of care per policy;Provide and maintain safe environment;Use appropriate transfer methods;Ensure appropriate safety devices are available at the bedside;Include patient/ family/ care giver in decisions related to safety;Hourly rounding;Assess for patients risk for elopement and implement Elopement Risk Plan per policy;Provide alternative method of communication if needed (communication boards, writing)       Problem: Pain  Goal: Pain at adequate level as identified by patient  Outcome: Progressing   09/02/17 2333   Goal/Interventions addressed this shift   Pain at adequate level as identified by patient Identify patient comfort function goal;Assess for risk of opioid induced respiratory depression, including snoring/sleep apnea. Alert healthcare team of risk factors identified.;Assess pain on admission, during daily assessment and/or before any "as needed" intervention(s);Reassess pain within 30-60 minutes of any procedure/intervention, per Pain Assessment, Intervention, Reassessment (AIR) Cycle;Evaluate if patient comfort function goal is met;Evaluate patient's satisfaction with pain management progress;Offer non-pharmacological pain management interventions;Consult/collaborate with Pain Service       Problem: Side Effects from Pain Analgesia  Goal: Patient will experience minimal side effects of analgesic therapy  Outcome: Progressing   09/03/17 1228   Goal/Interventions addressed this shift   Patient will experience minimal side effects of analgesic therapy Monitor/assess patient's respiratory status (RR depth, effort, breath sounds);Assess for changes in cognitive function;Prevent/manage side effects per LIP orders (i.e. nausea,  vomiting, pruritus, constipation, urinary retention, etc.);Evaluate for opioid-induced sedation with appropriate assessment tool (i.e. POSS)

## 2017-09-03 NOTE — Progress Notes (Signed)
Nutritional Support Services  Nutrition Screening    TELL Christopher Trevino 42 y.o. male                                 MRN: [id number]    Referral Source: MST screen   Reason for Referral: Weight loss    Patient is a 42 year old male admitted for abdominal pain and rectal bleeding. Spoke with patient who also stated that he had a tonsillectomy 2 days ago.  Complaint of pain with swallowing secondary to recent surgery but able to tolerate liquids.  He may have had a few pound weight loss over the past couple days due to tonsillectomy and less po intake than usual but patient has not had any unintentional weight loss prior to that.  He is very active, rides his mountain bike.     Orders Placed This Encounter   Procedures   . Diet dysphagia MECHANICALLY ALTERED (Level 2) Liquid consistency: Thin   . Ensure Enlive Supplement Quantity: A. One; Frequency: BID with meals Lunch and dinner      PO Intake: sips per patient    Chart review completed.  Labs and meds noted.      Patient knowledge of therapeutic diet: patient on regular diet at home, no need for education at this time.    No nutrition diagnosis or intervention at this time.  Please consult dietitian if needed.    Chase Caller, RDN, CNSC  [phone]

## 2017-09-03 NOTE — Consults (Signed)
GASTROENTEROLOGY CONSULTATION  [facility]  [facility]  [phone]        Date Time: 09/03/17 12:29 PM  Patient Name: Christopher Trevino A  Requesting Physician: Anselmo Pickler, MD      Reason for Consultation:   Bloody diarrhea    Assessment:   - acute diarrhea, bloody. Negative stool culture. Hemodynamically stable, normal and stable h/h  - lower abdominal cramps- controlled  - mild leukocytosis  - s/p tonsillectomy 08/31/2017 for tonsillar abscess    DDx. colon ischemia, infectious colitis (viral), antibiotic associated diarrhea, others    Plan:   1. Supportive care, IV fluids, symptomatic treatment  2. Monitor h/h, goal hgb >7  3. Since stool cx negative, no indication for antibiotics from GI standpoint.  4. Will follow tomorrow and reassess. Would defer colonoscopy for a few weeks given some pain with swallowing from recent operation and given his h/h is stable and normal and given the fact that this is likely an acute process.  5. Advised to follow up with [place] as outpatient in 1-2 weeks. Contact info given to his wife at bedside    History:   Christopher Trevino is a 42 y.o. male who presents to the hospital on 09/02/2017 with lower abdominal pain and diarrhea. Patient had h/o peritonsillar abscess, was on a month long of different antibiotics in January (clindamycin, augmentin, others.), but no antibiotics in February, per patient. On 08/31/2017 he underwent elective tonsillectomy. Received 3 days of augmentin pre-op. Woke up yesterday with lower abdominal cramps.. Followed by bloody diarrhea. He had 4-5 episodes, then came to ER.  He was started on flagyl and cipro. Stool tests came back negative. CT shows thickening of colonic wall suggestive of colitis.     Currently he's in NAD, wife and daughter in the room. Had small volume loose stools this AM. Pain controlled.    Endoscopic History   EGD:    Colonoscopy:   ERCP:    Past Medical History:     Past Medical History:   Diagnosis  Date   . History of migraine     LAST MIGRANE Aug 17 2017  EVEN 1 BEER OR GLASS OF WINE BRINGS IT ON            Past Surgical History:     Past Surgical History:   Procedure Laterality Date   . INCISION & DRAINAGE, RETROPHARYNGEAL ABSCESS Left 07/01/2017    Procedure: INCISION & DRAINAGE, RETROPHARYNGEAL ABSCESS;  Surgeon: Francis Gaines, MD;  Location: [facility];  Service: ENT;  Laterality: Left;   . NASAL SEPTUM SURGERY  AGE 62   . TONSILLECTOMY N/A 08/31/2017    Procedure: TONSILLECTOMY AND EXCISION OF FRENULUM LESION;  Surgeon: Francis Gaines, MD;  Location: [facility];  Service: ENT;  Laterality: N/A;       Family History:     Family History   Problem Relation Age of Onset   . Hyperlipidemia Mother    . Hypertension Mother    . Hyperlipidemia Father    . Hypertension Father        Social History:     Social History     Social History   . Marital status: Married     Spouse name: N/A   . Number of children: N/A   . Years of education: N/A     Social History Main Topics   . Smoking status: Former Smoker     Packs/day: 0.50     Years:  3.00     Quit date: 05/10/1999   . Smokeless tobacco: Never Used   . Alcohol use 0.0 - 1.2 oz/week      Comment: USED TO DO 6 BEE A WK DECREASED Jun 24 2017   . Drug use: No   . Sexual activity: Not on file     Other Topics Concern   . Not on file     Social History Narrative   . No narrative on file       Allergies:   No Known Allergies    Medications:     Current Facility-Administered Medications   Medication Dose Route Frequency   . lactobacillus/streptococcus  1 capsule Oral Daily   . levoFLOXacin  750 mg Intravenous Q24H   . metroNIDAZOLE  500 mg Intravenous Q8H SCH   . pantoprazole  40 mg Intravenous Daily       Review of Systems:       A comprehensive review of systems was: History obtained from the patient  The patient denies any additional changes to their otic, opthalmologic, dermatologic, pulmonary, cardiac, genitourinary, musculoskeletal, hematologic,  constitutional, or psychiatric systems.      Physical Exam:     Vitals:    09/03/17 0901   BP: (!) 133/91   Pulse: 97   Resp: 15   Temp: 97.4 F (36.3 C)   SpO2: 98%       Intake and Output Summary (Last 24 hours) at Date Time    Intake/Output Summary (Last 24 hours) at 09/03/17 1229  Last data filed at 09/03/17 1478   Gross per 24 hour   Intake             2678 ml   Output              600 ml   Net             2078 ml       General appearance - alert, well appearing, and in no distress  Mental status - alert, oriented to person, place, and time  Eyes - pupils equal and reactive, extraocular eye movements intact  Chest - clear   Heart - normal rate, regular rhythm  Abdomen - soft, nontender, nondistended, no masses or organomegaly  Extremities - peripheral pulses normal, no pedal edema, no clubbing or cyanosis        Lab:     Results     Procedure Component Value Units Date/Time    Comprehensive metabolic panel [[id number]]  (Abnormal) Collected:  09/03/17 0734    Specimen:  Blood Updated:  09/03/17 0827     Glucose 101 (H) mg/dL      BUN 8.6 (L) mg/dL      Creatinine 0.8 mg/dL      Sodium 657 mEq/L      Potassium 3.9 mEq/L      Chloride 105 mEq/L      CO2 28 mEq/L      Calcium 9.0 mg/dL      Protein, Total 5.8 (L) g/dL      Albumin 3.2 (L) g/dL      AST (SGOT) 15 U/L      ALT 12 U/L      Alkaline Phosphatase 56 U/L      Bilirubin, Total 1.2 mg/dL      Globulin 2.6 g/dL      Albumin/Globulin Ratio 1.2     Anion Gap 7.0    GFR [[id number]] Collected:  09/03/17 0734  Updated:  09/03/17 0827     EGFR >60.0    CBC without differential [[id number]]  (Abnormal) Collected:  09/03/17 0734    Specimen:  Blood from Blood Updated:  09/03/17 0805     WBC 11.49 (H) x10 3/uL      Hgb 14.6 g/dL      Hematocrit 82.9 %      Platelets 181 x10 3/uL      RBC 4.65 x10 6/uL      MCV 88.6 fL      MCH 31.4 pg      MCHC 35.4 g/dL      RDW 11 %      MPV 11.3 fL      Nucleated RBC 0.0 /100 WBC      Absolute NRBC 0.00 x10 3/uL     Stool for  Salm/Shig/Campy/Shiga PCR [[id number]] Collected:  09/02/17 1655    Specimen:  Stool Updated:  09/03/17 0425     Stool Salmonella Species by PCR Negative     Stl Shigella Species/Enteroinvasive Escherichia Coli PCR Negative     Stool Campylobacter Jejunii/Coli by PCR Negative     Stool Shiga Toxin by PCR Negative    Stool for C. Diff. [[id number]] Collected:  09/02/17 1655    Specimen:  Stool from Stool Updated:  09/03/17 0344     Clostridium Difficile Toxin B PCR Negative    Narrative:       Patient's current admission began:->less than or equal to 3  days ago  Cancel C-Diff order if no diarrhea in 12 hours?->Yes    Hemoglobin and hematocrit, blood [[id number]] Collected:  09/02/17 2205    Specimen:  Blood Updated:  09/02/17 2210     Hgb 15.5 g/dL      Hematocrit 10.2 %     Lactic acid, plasma [[id number]] Collected:  09/02/17 1814    Specimen:  Blood Updated:  09/02/17 1829     Lactic acid 0.6 mmol/L     Narrative:       With next blood draw    PT/APTT [[id number]] Collected:  09/02/17 1400     Updated:  09/02/17 1441     PT 12.9 sec      PT INR 1.0     PT Anticoag. Given Within 48 hrs. None     PTT 26 sec     Comprehensive metabolic panel [[id number]]  (Abnormal) Collected:  09/02/17 1400    Specimen:  Blood Updated:  09/02/17 1438     Glucose 108 (H) mg/dL      BUN 95.1 mg/dL      Creatinine 0.8 mg/dL      Sodium 884 mEq/L      Potassium 4.7 mEq/L      Chloride 104 mEq/L      CO2 24 mEq/L      Calcium 9.3 mg/dL      Protein, Total 6.9 g/dL      Albumin 3.8 g/dL      AST (SGOT) 20 U/L      ALT 14 U/L      Alkaline Phosphatase 65 U/L      Bilirubin, Total 0.9 mg/dL      Globulin 3.1 g/dL      Albumin/Globulin Ratio 1.2     Anion Gap 13.0    GFR [[id number]] Collected:  09/02/17 1400     Updated:  09/02/17 1438     EGFR >60.0    CBC with differential [[id number]]  (Abnormal) Collected:  09/02/17 1400  Specimen:  Blood from Blood Updated:  09/02/17 1418     WBC 12.25 (H) x10 3/uL      Hgb 15.8 g/dL      Hematocrit 16.1 %       Platelets 198 x10 3/uL      RBC 5.00 x10 6/uL      MCV 87.8 fL      MCH 31.6 pg      MCHC 36.0 (H) g/dL      RDW 11 %      MPV 11.5 fL      Neutrophils 84.1 %      Lymphocytes Automated 6.3 %      Monocytes 9.0 %      Eosinophils Automated 0.0 %      Basophils Automated 0.2 %      Immature Granulocyte 0.4 %      Nucleated RBC 0.0 /100 WBC      Neutrophils Absolute 10.31 (H) x10 3/uL      Abs Lymph Automated 0.77 x10 3/uL      Abs Mono Automated 1.10 (H) x10 3/uL      Abs Eos Automated 0.00 x10 3/uL      Absolute Baso Automated 0.02 x10 3/uL      Absolute Immature Granulocyte 0.05 x10 3/uL      Absolute NRBC 0.00 x10 3/uL             Radiology:   Radiological Procedure reviewed.     Signed by: Ilda Basset

## 2017-09-03 NOTE — Plan of Care (Signed)
Problem: Safety  Goal: Patient will be free from infection during hospitalization  Outcome: Progressing   09/03/17 2043   Goal/Interventions addressed this shift   Free from Infection during hospitalization Assess and monitor for signs and symptoms of infection;Monitor lab/diagnostic results       Problem: Pain  Goal: Pain at adequate level as identified by patient  Outcome: Progressing   09/03/17 2043   Goal/Interventions addressed this shift   Pain at adequate level as identified by patient Identify patient comfort function goal;Assess for risk of opioid induced respiratory depression, including snoring/sleep apnea. Alert healthcare team of risk factors identified.;Assess pain on admission, during daily assessment and/or before any "as needed" intervention(s);Reassess pain within 30-60 minutes of any procedure/intervention, per Pain Assessment, Intervention, Reassessment (AIR) Cycle;Evaluate if patient comfort function goal is met;Evaluate patient's satisfaction with pain management progress;Offer non-pharmacological pain management interventions;Include patient/patient care companion in decisions related to pain management as needed

## 2017-09-04 LAB — COMPREHENSIVE METABOLIC PANEL
ALT: 10 U/L (ref 0–55)
AST (SGOT): 13 U/L (ref 5–34)
Albumin/Globulin Ratio: 1 (ref 0.9–2.2)
Albumin: 2.9 g/dL — ABNORMAL LOW (ref 3.5–5.0)
Alkaline Phosphatase: 56 U/L (ref 38–106)
Anion Gap: 10 (ref 5.0–15.0)
BUN: 7.4 mg/dL — ABNORMAL LOW (ref 9.0–28.0)
Bilirubin, Total: 0.9 mg/dL (ref 0.2–1.2)
CO2: 26 mEq/L (ref 22–29)
Calcium: 9.1 mg/dL (ref 8.5–10.5)
Chloride: 103 mEq/L (ref 100–111)
Creatinine: 0.7 mg/dL (ref 0.7–1.3)
Globulin: 2.9 g/dL (ref 2.0–3.6)
Glucose: 97 mg/dL (ref 70–100)
Potassium: 3.9 mEq/L (ref 3.5–5.1)
Protein, Total: 5.8 g/dL — ABNORMAL LOW (ref 6.0–8.3)
Sodium: 139 mEq/L (ref 136–145)

## 2017-09-04 LAB — GFR: EGFR: >60

## 2017-09-04 LAB — CBC AND DIFFERENTIAL
Absolute NRBC: 0 10*3/uL (ref 0.00–0.00)
Basophils Absolute Automated: 0.05 10*3/uL (ref 0.00–0.08)
Basophils Automated: 0.4 %
Eosinophils Absolute Automated: 0.02 10*3/uL (ref 0.00–0.44)
Eosinophils Automated: 0.2 %
Hematocrit: 39.9 % (ref 37.6–49.6)
Hgb: 14.2 g/dL (ref 12.5–17.1)
Immature Granulocytes Absolute: 0.04 10*3/uL (ref 0.00–0.07)
Immature Granulocytes: 0.4 %
Lymphocytes Absolute Automated: 1.14 10*3/uL (ref 0.42–3.22)
Lymphocytes Automated: 10.1 %
MCH: 31.9 pg (ref 25.1–33.5)
MCHC: 35.6 g/dL (ref 31.5–35.8)
MCV: 89.7 fL (ref 78.0–96.0)
MPV: 10.8 fL (ref 8.9–12.5)
Monocytes Absolute Automated: 1.02 10*3/uL — ABNORMAL HIGH (ref 0.21–0.85)
Monocytes: 9 %
Neutrophils Absolute: 9.02 10*3/uL — ABNORMAL HIGH (ref 1.10–6.33)
Neutrophils: 79.9 %
Nucleated RBC: 0 /100 WBC (ref 0.0–0.0)
Platelets: 180 10*3/uL (ref 142–346)
RBC: 4.45 10*6/uL (ref 4.20–5.90)
RDW: 11 % (ref 11–15)
WBC: 11.29 10*3/uL — ABNORMAL HIGH (ref 3.10–9.50)

## 2017-09-04 LAB — LACTIC ACID, PLASMA: Lactic Acid: 0.8 mmol/L (ref 0.2–2.0)

## 2017-09-04 MED ORDER — POTASSIUM CHLORIDE CRYS ER 20 MEQ PO TBCR
20.00 meq | EXTENDED_RELEASE_TABLET | Freq: Once | ORAL | Status: AC
Start: 2017-09-04 — End: 2017-09-04
  Administered 2017-09-04: 11:00:00 20 meq via ORAL
  Filled 2017-09-04: qty 1

## 2017-09-04 MED ORDER — DOCUSATE SODIUM 100 MG PO CAPS
100.00 mg | ORAL_CAPSULE | Freq: Two times a day (BID) | ORAL | Status: DC | PRN
Start: 2017-09-04 — End: 2017-09-04
  Administered 2017-09-04: 08:00:00 100 mg via ORAL
  Filled 2017-09-04: qty 1

## 2017-09-04 MED ORDER — OXYCODONE HCL 5 MG PO TABS
5.00 mg | ORAL_TABLET | Freq: Four times a day (QID) | ORAL | 0 refills | Status: DC | PRN
Start: 2017-09-04 — End: 2018-11-27

## 2017-09-04 MED ORDER — OMEPRAZOLE 40 MG PO CPDR
40.00 mg | DELAYED_RELEASE_CAPSULE | Freq: Every morning | ORAL | 0 refills | Status: DC
Start: 2017-09-04 — End: 2018-11-27

## 2017-09-04 MED ORDER — RISAQUAD PO CAPS
1.00 | ORAL_CAPSULE | Freq: Every day | ORAL | 0 refills | Status: AC
Start: 2017-09-04 — End: 2017-10-04

## 2017-09-04 MED ORDER — SODIUM CHLORIDE 0.9 % IV BOLUS
500.00 mL | Freq: Once | INTRAVENOUS | Status: AC
Start: 2017-09-04 — End: 2017-09-04
  Administered 2017-09-04: 11:00:00 500 mL via INTRAVENOUS

## 2017-09-04 NOTE — Progress Notes (Signed)
Infectious Disease            Progress Note    09/04/2017   [name] FAO:[id number],GEX:[id number] is a 42 y.o. male, with history significant for migraines, admitted with colitis.    Subjective:     Christopher Trevino today Symptoms:  Afebrile, feeling better. No more diarrhea. Improving abdominal pain.  No shortness of breath,cough, chest pain, chest pressure. No dysuria, increase frequency. No headache, dizziness or new weakness tingling or numbness. No Rashes or ecchymosis. No joint pains, swelling.   Other review of system is non contributory.    Objective:     Blood pressure 119/78, pulse (!) 102, temperature 99.1 F (37.3 C), temperature source Oral, resp. rate 16, height 1.702 m (5\' 7" ), weight 65.9 kg (145 lb 4.5 oz), SpO2 97 %.    General Appearance: In no acute distress.   HEENT: Pallor negative, Anicteric sclera.   Neck: Supple, trachea midline, no adenopathy  Lungs: Breath sounds clear to auscultation. No wheezes, rales, rhonchi.   Chest Wall: Symmetric chest wall expansion.   Heart : S1 and S2. no murmurs, rub, gallop  Abdomen: Abdomen is soft. Mild diffuse discomfort. Bowel sounds positive  Neurological: Alert and oriented to person, place and time. Normal strength. No gross defect.   Extremities: Normal range of motion.  Skin: Warm and dry. No rash or ecchymosis   Psychiatric:  Mood and affect is appropriate    Laboratory And Diagnostic Studies:     Recent Labs      09/04/17   0620  09/03/17   0734   09/02/17   1400   WBC  11.29*  11.49*   --   12.25*   Hgb  14.2  14.6   < >  15.8   Hematocrit  39.9  41.2   < >  43.9   Platelets  180  181   --   198   Neutrophils  79.9   --    --   84.1    < > = values in this interval not displayed.     Recent Labs      09/04/17   0620  09/03/17   0734   Sodium  139  140   Potassium  3.9  3.9   Chloride  103  105   CO2  26  28   BUN  7.4*  8.6*   Creatinine  0.7  0.8   Glucose  97  101*   Calcium  9.1  9.0     Recent Labs      09/04/17   0620   09/03/17   0734   AST (SGOT)  13  15   ALT  10  12   Alkaline Phosphatase  56  56   Protein, Total  5.8*  5.8*   Albumin  2.9*  3.2*   Bilirubin, Total  0.9  1.2       Current Med's:     Current Facility-Administered Medications   Medication Dose Route Frequency   . lactobacillus/streptococcus  1 capsule Oral Daily   . pantoprazole  40 mg Intravenous Daily   . potassium chloride  20 mEq Oral Once   . sodium chloride  500 mL Intravenous Once       Lines/Drains:     Patient Lines/Drains/Airways Status    Active Lines, Drains and Airways     Name:   Placement date:   Placement time:   Site:   Days:  Peripheral IV 09/02/17 Left Antecubital  09/02/17    1403    Antecubital    1                Assessment:      Condition: Guarded   Acute hemorrhagic colitis   Status post recent, tonsillectomy   Migraines     Plan:      We'll continue to monitor without antibiotics   Correction of electrolytes   Continue supportive care   GI follow-up   Can be discharged from ID perspective with close monitoring          Christopher Trevino, M.D.,FACP  09/04/2017  10:13 AM          *This note was generated by the Epic EMR system/ Dragon speech recognition and may contain inherent errors or omissions not intended by the user. Grammatical errors, random word insertions, deletions, pronoun errors and incomplete sentences are occasional consequences of this technology due to software limitations. Not all errors are caught or corrected. If there are questions or concerns about the content of this note or information contained within the body of this dictation they should be addressed directly with the author for clarification

## 2017-09-04 NOTE — Discharge Summary (Addendum)
[facility] Hospitalist Discharge Note    Note Date: 09/04/2017  10:15 AM  Patient Name:Christopher Trevino  YNW:[id number]  PCP: Daisey Must, DO  Admit Date:09/02/2017  Attending Physician:Kouper Spinella, Larita Fife, MD  Hospital Course:   Please see H&P for complete details of HPI and ROS. The patient was admitted to [facility] and has been diagnosed with the following conditions and has been taken care as mentioned below.    Patient Active Problem List    Diagnosis Date Noted   . Lower abdominal pain 09/02/2017   . Colitis 09/02/2017   . Rectal bleeding    . Peritonsillar abscess 06/29/2017   . Hyperglycemia 06/29/2017   . History of migraine        Admission HPI  This 42 year old Caucasian male with past medical history as seen above comes into the ER complaining of abdominal pain with bloody diarrhea.  States H and P obtained from patient and wife at the bedside. She was admitted in the beginning of January for a peritonsillar abscess and finally had an elective tonsillectomy done by [facility] on 08/31/17 reoccurring infections. For the last few months he has been on and off antibiotics including prophylactic Augmentin for 3 days prior to surgery.  He had no diarrhea or any gastrointestinal upset with these antibiotics since his surgery.  Has had poor by mouth intake and has felt constipated, especially on hydrocodone. He did a small bowel movement last night but this morning when he woke up, had bright red diarrhea about 4-5 episodes with lower abdominal cramping and with extensive bloody diarrhea.  He became concerned and came to the ER for evaluation.  CT scan is showing colitis.  Patient has no past medical history of ulcerative colitis, Crohn's or had a gastrointestinal workup. Patient denies any unusual foods, fever, chills, or any other abdominal symptoms. Plating of throat discomfort as well as abdominal cramping of the time of my exam, but no other acute issues      Hospital course and management plan  Acute  hemorrhagic colitis: Cause unclear  Stool studies including C. difficile, Campylobacter, Shigella and Salmonella negative  Seen by gastroenterology, infectious disease and both recommended to discontinue antibiotics  Patient hemoglobin was serially monitored, and his rectal bleeding has stopped  His abdominal pain is now minimal and he is tolerating soft diet  Noted to be tachycardic this morning; I have ordered fluid bolus and increase fluid rate.  Repeat lactic acid continues to be negative  He is afebrile and blood pressure stable  He is insisting on being discharged.  I have convinced him to stay until later today and tachycardia improves or resolves with IV hydration, he can possibly be discharged subsequently home to follow with primary care physician next week   His ESR is normal.  CRP is elevated at 6.9.  He is recommended to follow with gastroenterology as an outpatient to consider colonoscopy  Status post tonsillectomy on 08/31/17 by [facility]  Patient has been seen by her this morning.   recommended outpatient follow-up  History of migraines: Currently stable.      Type of Admission: Inpatient  Medical Necessity for stay: Acute hemorrhagic colitis  Central Line/Foley Catheter/PICC line status: none  Date of Admission:   09/02/2017  Date of Discharge:   09/04/2017     Chief Complaint:      Chief Complaint   Patient presents with   . Rectal Bleeding   . Abdominal Pain  Physical Exam:   Vitals reviewed:   height is 1.702 m (5\' 7" ) and weight is 65.9 kg (145 lb 4.5 oz). His oral temperature is 99.1 F (37.3 C). His blood pressure is 119/78 and his pulse is 102 (abnormal). His respiration is 16 and oxygen saturation is 97%.   Body mass index is 22.75 kg/m.  Vitals:    09/03/17 2212 09/03/17 2342 09/04/17 0513 09/04/17 0907   BP: 108/68 118/71 108/71 119/78   Pulse: (!) 101 (!) 102 (!) 104 (!) 102   Resp: 16 17 17 16    Temp: 97.5 F (36.4 C) 97.2 F (36.2 C) 97.9 F (36.6 C) 99.1 F (37.3 C)    TempSrc: Temporal Artery Temporal Artery Temporal Artery Oral   SpO2: 96% 97% 97% 97%   Weight:       Height:         Intake and Output Summary (Last 24 hours) at Date Time    Intake/Output Summary (Last 24 hours) at 09/04/17 1015  Last data filed at 09/04/17 0907   Gross per 24 hour   Intake             2447 ml   Output             1050 ml   Net             1397 ml       Exam  Awake Alert, Oriented *3, No new F.N deficits, Normal affect  NC.AT,PERRAL  Supple Neck,No JVD, No cervical lymphadenopathy appriciated.   Symmetrical Chest wall movement, Good air movement bilaterally, CTAB  RRR,No Gallops,Rubs or new Murmurs, No Parasternal Heave  +ve B.Sounds, Abd Soft,Mild diffuse tenderness, No organomegaly appriciated, No rebound -guarding or rigidity.  No Cyanosis, Clubbing or edema, No new Rash or bruise      Discharge Diagnosis:   Hospital Problems:  Principal Problem:    Colitis  Active Problems:    Hyperglycemia    History of migraine    Lower abdominal pain    Rectal bleeding    Lists the present on admission hospital problems  Present on Admission:  . Lower abdominal pain  . Colitis  . Hyperglycemia    Consult Input/Plan   Infectious disease and gastroenterology  Procedures performed:   No orders of the defined types were placed in this encounter.    Physical Exam:    height is 1.702 m (5\' 7" ) and weight is 65.9 kg (145 lb 4.5 oz). His oral temperature is 99.1 F (37.3 C). His blood pressure is 119/78 and his pulse is 102 (abnormal). His respiration is 16 and oxygen saturation is 97%.   Body mass index is 22.75 kg/m.  Vitals:    09/03/17 2212 09/03/17 2342 09/04/17 0513 09/04/17 0907   BP: 108/68 118/71 108/71 119/78   Pulse: (!) 101 (!) 102 (!) 104 (!) 102   Resp: 16 17 17 16    Temp: 97.5 F (36.4 C) 97.2 F (36.2 C) 97.9 F (36.6 C) 99.1 F (37.3 C)   TempSrc: Temporal Artery Temporal Artery Temporal Artery Oral   SpO2: 96% 97% 97% 97%   Weight:       Height:         Intake and Output Summary (Last 24  hours) at Date Time    Intake/Output Summary (Last 24 hours) at 09/04/17 1015  Last data filed at 09/04/17 0907   Gross per 24 hour   Intake  2447 ml   Output             1050 ml   Net             1397 ml         Labs:   I have reviewed the labs  Results     Procedure Component Value Units Date/Time    GFR [[id number]] Collected:  09/04/17 0620     Updated:  09/04/17 0702     EGFR >60.0    Comprehensive metabolic panel [[id number]]  (Abnormal) Collected:  09/04/17 0620    Specimen:  Blood Updated:  09/04/17 0702     Glucose 97 mg/dL      BUN 7.4 (L) mg/dL      Creatinine 0.7 mg/dL      Calcium 9.1 mg/dL      Sodium 782 mEq/L      Potassium 3.9 mEq/L      Chloride 103 mEq/L      CO2 26 mEq/L      Anion Gap 10.0     Protein, Total 5.8 (L) g/dL      Albumin 2.9 (L) g/dL      AST (SGOT) 13 U/L      ALT 10 U/L      Alkaline Phosphatase 56 U/L      Bilirubin, Total 0.9 mg/dL      Globulin 2.9 g/dL      Albumin/Globulin Ratio 1.0    CBC and differential [[id number]]  (Abnormal) Collected:  09/04/17 0620    Specimen:  Blood from Blood Updated:  09/04/17 0646     WBC 11.29 (H) x10 3/uL      Hgb 14.2 g/dL      Hematocrit 57.8 %      Platelets 180 x10 3/uL      RBC 4.45 x10 6/uL      MCV 89.7 fL      MCH 31.9 pg      MCHC 35.6 g/dL      RDW 11 %      MPV 10.8 fL      Neutrophils 79.9 %      Lymphocytes Automated 10.1 %      Monocytes 9.0 %      Eosinophils Automated 0.2 %      Basophils Automated 0.4 %      Immature Granulocyte 0.4 %      Nucleated RBC 0.0 /100 WBC      Neutrophils Absolute 9.02 (H) x10 3/uL      Abs Lymph Automated 1.14 x10 3/uL      Abs Mono Automated 1.02 (H) x10 3/uL      Abs Eos Automated 0.02 x10 3/uL      Absolute Baso Automated 0.05 x10 3/uL      Absolute Immature Granulocyte 0.04 x10 3/uL      Absolute NRBC 0.00 x10 3/uL     Sedimentation rate (ESR) [[id number]] Collected:  09/03/17 0734    Specimen:  Blood Updated:  09/03/17 1408     Sed Rate 12 mm/Hr     C Reactive Protein [[id number]]   (Abnormal) Collected:  09/03/17 0734    Specimen:  Blood Updated:  09/03/17 1404     C-Reactive Protein 6.9 (H) mg/dL         Rads:   I have reviewed the Radiology reports.  Echocardiogram Adult Complete W Clr/ Dopp Waveform    Result Date: 08/29/2017  [facility]S [facility] [place] [id number]  [facility] [address]                               [1]logo [place] [address] [phone] Location:               [facility] CDS Charlann Noss MRN:    [id number] Date of Service: 08/29/2017 Patient Name:           Maanav, Kassabian                          DOB:    10-31-1975 Height:          66.5in Interpreting Physician: Lanette Hampshire, MD                      Gender: Judie Petit        Weight:          144lb Sonographer:            Volanda Napoleon, RDCS                    Age:    69       BSA:             1.76m2 Indication:             Left ventricular hypertrophy            BP:     131/85 Study Quality: Technically adequate. Referring Physician: Daisey Must, D.O. _______________________________________________________________________________________________ Echocardiogram Report Echocardiogram was performed conforming to the American Society of Echocardiography protocols for a complete study using 2D/M-Mode, spectral and color flow Doppler modalities in obtaining imaging, hemodynamics, measurements, and calculations. Conclusions: 1. Normal diameters of the aortic root, ascending aorta, aortic arch, and descending thoracic aorta.              2. Normal left ventricular and right ventricular wall thicknesses, chamber dimensions, and contractility; normal indices of diastolic left ventricular function.              3. Normal atria.              4. Normal valvular structure and movement, without stenosis or significant regurgitation.              5. No evidence of intracardiac shunt, intracardiac thrombus, inferior vena cava dilatation, pulmonary hypertension, or pericardial effusion.              6. Normal  echocardiogram.              7. No prior studies available for comparison. Findings: Electronically signed Lanette Hampshire, MD 08/29/17 2:56 PM _______________________________________________________________________________________________ STUDY DATA _______________________________________________________________________________________________ Location:               [facility] CDS Charlann Noss MRN:    [id number] Patient Name:           Zaim, Nitta                          DOB:    [birth year] Date of Service: 08/29/2017 Interpreting Physician: Lanette Hampshire, MD                      Gender: M        Height:          66.5in Sonographer:  Volanda Napoleon, RDCS                    Age:    71       Weight:          144lb Indication:             Left ventricular hypertrophy            BP:     131/85 ---------- Left Ventricle ---------- ------------ Aorta            ----------- ------- Mitral Valve ------- LVIDd:           5.5(3.7-5.6)cm    Aortic Root:           3.1(2.3-3.7)cm LVIDs:           3.9(2.0-3.5)cm IVSDd:           0.82(0.6-1.1)cm   Prox. Ascending Aorta: 2.8(2.3-3.7)cm LVPW:            0.9(0.6-1.1)cm    ------------ Aortic Valve     ----------- EF:              60(50-75)%        Peak:                  1.32(<=2.5)m/s ---------- Left Atrium    ---------- LA:              3.5(1.9-4.0)cm LA Volume Index: 30.5(16-34)ml/m2 ---------- Right Heart    ---------- RVIDd:           3.1(0.7-4.1)cm RA Area:         13.1(<=18)cm2    LVOT Diam:             2.2(1.8-2.4)cm                                      LVOT PV:               0.86(0.7-1.1)m/s -------- Diastolic Function --------                                           ------ Tricuspid Valve -----                                                                                TR Peak Vel:  2.57m/s                                      ------------ Pulmonic Valve   ----------- TR Peak Grad:  E/A Ratio:       1.1(0.75-1.5)     Peak Vel:              1.2(<=2)m/s      RAP:          5(<=5)mmHg  RVSP:         28(<=36)mmHg                                                                               IVC:          1.8(<=2.1)cm Report for Jadarrius Maselli [id number] on 08/29/17 References Visible links    Ct Abdomen Pelvis W Iv And Po Cont    Result Date: 09/02/2017  Patient: Lower abdominal pain. Procedure: Contrast-enhanced CT of the abdomen and pelvis. 100 mL of Omnipaque 350 were administered intravenously. Oral contrast material is also present. Findings: There is moderate to severe diffuse thickening of the colonic wall consistent with colitis. Some fluid is present in the rectum. Correlate for diarrhea. No dilated bowel loops. No free intraperitoneal gas or free fluid. The appendix is seen extending superomedially from the cecal tip. No evidence of appendicitis. Unremarkable liver and gallbladder. No dilatation of the biliary tree. Unremarkable spleen, pancreas, adrenals, kidneys, and abdominal aorta. Normal cardiac size. Included portions of the lungs are unremarkable.      Colitis. Wynema Birch, MD 09/02/2017 3:52 PM    Discharge Medications:        Discharge Medication List      Taking    DYMISTA 137-50 MCG/ACT Susp  Dose:  2 spray  Generic drug:  Azelastine-Fluticasone  2 sprays by Nasal route 2 (two) times daily.     lactobacillus/streptococcus Caps  Dose:  1 capsule  Take 1 capsule by mouth daily.     omeprazole 40 MG capsule  Dose:  40 mg  What changed:   medication strength   how much to take   when to take this  Commonly known as:  PriLOSEC  Take 1 capsule (40 mg total) by mouth every morning before breakfast.     ondansetron 4 MG tablet  Dose:  4 mg  Commonly known as:  ZOFRAN  Take 4 mg by mouth every 8 (eight) hours as needed for Nausea.     oxyCODONE 5 MG immediate release tablet  Dose:  5  mg  Commonly known as:  ROXICODONE  Take 1 tablet (5 mg total) by mouth every 6 (six) hours as needed (MODERATE TO SEVERE PAIN).     polyethylene glycol packet  Dose:  17 g  Commonly known as:  MIRALAX  Take 17 g by mouth daily.     SUMAtriptan 100 MG tablet  Dose:  100 mg  Commonly known as:  IMITREX  Take 1 tablet (100 mg total) by mouth every 2 (two) hours as needed for Migraine.for up to 9 doses May repeat one time in 24 hours.        STOP taking these medications    HYDROcodone-acetaminophen 5-325 MG per tablet  Commonly known as:  NORCO            Pending Labs:     Unresulted Labs     None         Discharge Destination:   home   Condition at Discharge :   stable   Labs/Images to be followed at your PCP office   Cbc cmp   Follow-up:     Follow-up  Information     Ilda Basset, MD. Call in 2 week(s).    Specialties:  Gastroenterology, Internal Medicine  Contact information:  [phone] [facility]  [address]  [place] [address]  [phone]             Daisey Must, DO Follow up in 3 week(s).    Specialty:  Family Medicine  Contact information:  [address] [facility]  [address]  [place] [address]  [phone]                      Time spent for Discharge Care:   35 minutes      Signed by: Anselmo Pickler, MD

## 2017-09-04 NOTE — Progress Notes (Signed)
GI attending:  No complaints. Reports no BM since yesterday 9 AM. Some gas, but no pain.  Abd: soft, NT, ND  Labs reviewed.   Agree with fluid bolus, encourage increased fluid intake.  Office follow up with [place] in 1-2 weeks. Will plan outpatient colonoscopy.    Dr. Francis Dowse

## 2017-09-04 NOTE — UM Notes (Signed)
42 yr old presented to ED on 09/02/17 and placed on OBS status @1616  for colitis. Changed to inpatient 09/03/16    42 year old male with abdominal pain and rectal bleeding. Describes 3-4 bouts of rectal bleeding without stool. Describes it as bright blood. Also has pain in the bilateral lower abdomen. Had a tonsillectomy 2 days ago    Past Medical History:   Diagnosis Date   . History of migraine     LAST MIGRANE Aug 17 2017  EVEN 1 BEER OR GLASS OF WINE BRINGS IT ON     Past Surgical History:   Procedure Laterality Date   . INCISION & DRAINAGE, RETROPHARYNGEAL ABSCESS Left 07/01/2017    Procedure: INCISION & DRAINAGE, RETROPHARYNGEAL ABSCESS;  Surgeon: Francis Gaines, MD;  Location: [facility];  Service: ENT;  Laterality: Left;   . NASAL SEPTUM SURGERY  AGE 83   . TONSILLECTOMY N/A 08/31/2017    Procedure: TONSILLECTOMY AND EXCISION OF FRENULUM LESION;  Surgeon: Francis Gaines, MD;  Location: [facility];  Service: ENT;  Laterality: N/A;     VS: t 98.8, HR 98, RR 16, 139/97, pain 5/10    Labs: wbc 12.25, glucose 108    CT abd/pelvis:       Colitis.        ED Meds:  NS bolus  PO & IV contrast for CT  Fentanyl IV  zofran 4mg  IV    OBS status    Medicine H/P:  1. Lower abdominal discomfort with diarrhea with CT scan showing colitis. Mild leukocytosis recently had tonsillectomy. No overt fever. Was on Augmentin for prior to tonsillectomy. Patient very stable with normal H and H and vital signs, but anxious about going home for these symptoms and we will observe overnight. Assessment mild leukocytosis on admission, but this could be reactionary. No overt fevers or signs of sepsis or sirs   Hold any antibiotics until stool cultures and C. difficile are obtained unless patient spikes a fever.   Continue to monitor H and H every 12 hours   Gentle IV fluids.    Started on IV Protonix.   Start on clear liquids and advance diet as tolerated.   Give Bentyl IM now to help with abdominal  cramping    He has no history of ulcerative or Crohn's disease. No family history. I told patient he will need to have a workup done outpatient by gastrointestinal   Monitor complete blood count and for spiking temps    2. Status post tonsillectomy on 08/31/17 by Dr. Rondel Jumbo. Was on Augmentin for 3 days prior to procedure. Has really had poor by mouth intake since then and is on hydrocodone every 6 hours for pain management.    Unfortunately, he is was liquid form of hydrocodone which is nonformulary here, so we will put on sublingual morphine 5mg  every 6 hours as needed (as of note the patient has a very low pain threshold)    Orders:  NS @ 174ml/hr  Clear liquid diet  Contact special isolation  CMP, CBC draws    DX:  Rectal bleeding    Colitis      Changed to inpatient 09/03/17 for colitis.  VS:   99.1 F (37.3 C) Oral  111 97 % -- 17 127/88     Labs: wbc 11.49, glucose 101, bun 8.6, crp 6.9,     Per medical:   Acute colitis: Cause unclear, infectious versus inflammatory.  Patient recently treated for retropharyngeal abscess with antibiotics,  but stool C. difficile is negative.  Stool Campylobacter, Shigella and Salmonella also negative.  We will continue IV fluid and supportive treatment.  Continue antibiotics and await infectious disease input  Rectal bleeding: Most likely secondary to above  Hb is currently stable despite of rectal bleeding  Consult gastroenterology  Check ESR and CRP  Daily complete blood count and transfuse as needed  Status post tonsillectomy on 08/31/17 by [facility]  Patient has been seen by her this morning.  Outpatient follow-up  History of migraines: Currently stable.  Treat as needed    Per GI:  Assessment:   - acute diarrhea, bloody. Negative stool culture. normal and stable h/h  - lower abdominal cramps- controlled  - mild leukocytosis  - s/p tonsillectomy 08/31/2017 for tonsillar abscess    DDx. colon ischemia, infectious colitis (viral), antibiotic associated  diarrhea, others    Plan:   1. Supportive care, IV fluids, symptomatic treatment  2. Monitor h/h, goal hgb >7  3. Since stool cx negative, no indication for antibiotics from GI standpoint.  4. Will follow tomorrow and reassess. Would defer colonoscopy for a few weeks given some pain with swallowing from recent operation and given his h/h is stable and normal and given the fact that this is likely an acute process.  5. Advised to follow up with [place] as outpatient in 1-2 weeks. Contact info given to his wife at bedside    Orders:  Current Facility-Administered Medications   Medication Dose Route Frequency Last Rate Last Dose   . 0.9%  NaCl infusion   Intravenous Continuous 125 mL/hr at 09/03/17 2339     . docusate sodium (COLACE) capsule 100 mg  100 mg Oral BID PRN       . guaiFENesin-dextromethorphan (ROBITUSSIN DM) 100-10 MG/5ML syrup 5 mL  5 mL Oral Q4H PRN   5 mL at 09/03/17 1553   . lactobacillus/streptococcus (RISAQUAD) capsule 1 capsule  1 capsule Oral Daily   1 capsule at 09/03/17 0948   . morphine injection 2 mg  2 mg Intravenous Q4H PRN   2 mg at 09/03/17 2339   . naloxone [facility]) injection 0.2 mg  0.2 mg Intravenous PRN       . oxyCODONE (ROXICODONE) immediate release tablet 5 mg  5 mg Oral Q6H PRN   5 mg at 09/04/17 0526   . pantoprazole (PROTONIX) injection 40 mg  40 mg Intravenous Daily   40 mg at 09/03/17 0948   . SUMAtriptan (IMITREX) tablet 100 mg  100 mg Oral Q2H PRN         Dysphagia diet  Bmp  Cbc  I/O  Pulse ox routine  Chg bath

## 2017-09-04 NOTE — Discharge Instr - AVS First Page (Signed)
Reason for your Hospital Admission:  COLITIS      Instructions for after your discharge:      Follow with Primary MD Daisey Must, DO in 7 days     Get CBC, CMP, checked in 7 days by Primary MD and again as instructed by your Primary MD. Get Medicines reviewed and adjusted.    Please request your Prim.MD to go over all Hospital Tests and Procedure/Radiological results at the follow up, please get all Hospital records sent to your Prim MD by signing hospital release before you go home.    Activity: Fall precautions use walker/cane & assistance as needed      Do not drive when taking Pain medications.        Diet: MECHANICAL SOFT    Disposition HOME    If you experience worsening of your admission symptoms, develop shortness of breath, life threatening emergency, suicidal or homicidal thoughts you must seek medical attention immediately by calling 911 or calling your MD immediately  if symptoms less severe.    Do not take more than prescribed Pain, Sleep and Anxiety Medications    Special Instructions: If you have smoked or chewed Tobacco  in the last 2 yrs please stop smoking, stop any regular Alcohol  and or any Recreational drug use.    You Must read complete instructions/literature along with all the possible adverse reactions/side effects for all the Medicines you take and that have been prescribed to you. Take any new Medicines after you have completely understood and accepted all the possible adverse reactions/side effects.

## 2017-09-05 ENCOUNTER — Telehealth (INDEPENDENT_AMBULATORY_CARE_PROVIDER_SITE_OTHER): Payer: Self-pay | Admitting: Family Medicine

## 2017-09-05 NOTE — Telephone Encounter (Signed)
Pt's mom is requesting to have the following labs done prior to the appt set for 3/12 at 1 pm.     CBC  CMP     If the orders are place or if Dr. Glenice Laine feels that he needs to see the pt; call the pt mother either way to let them know what needs to be done prior to the appt.     Pt's mother can be reached at: [phone]    Thank you!

## 2017-09-06 ENCOUNTER — Ambulatory Visit (INDEPENDENT_AMBULATORY_CARE_PROVIDER_SITE_OTHER): Payer: No Typology Code available for payment source | Admitting: Family Medicine

## 2017-09-06 ENCOUNTER — Encounter (INDEPENDENT_AMBULATORY_CARE_PROVIDER_SITE_OTHER): Payer: Self-pay | Admitting: Family Medicine

## 2017-09-06 VITALS — BP 125/88 | HR 87 | Temp 98.4°F | Wt 146.8 lb

## 2017-09-06 DIAGNOSIS — K529 Noninfective gastroenteritis and colitis, unspecified: Secondary | ICD-10-CM

## 2017-09-06 DIAGNOSIS — Z9089 Acquired absence of other organs: Secondary | ICD-10-CM

## 2017-09-06 LAB — CBC
Absolute NRBC: 0 10*3/uL (ref 0.00–0.00)
Hematocrit: 44.4 % (ref 37.6–49.6)
Hgb: 14.8 g/dL (ref 12.5–17.1)
MCH: 30.8 pg (ref 25.1–33.5)
MCHC: 33.3 g/dL (ref 31.5–35.8)
MCV: 92.3 fL (ref 78.0–96.0)
MPV: 11.9 fL (ref 8.9–12.5)
Nucleated RBC: 0 /100 WBC (ref 0.0–0.0)
Platelets: 258 10*3/uL (ref 142–346)
RBC: 4.81 10*6/uL (ref 4.20–5.90)
RDW: 11 % (ref 11–15)
WBC: 7.39 10*3/uL (ref 3.10–9.50)

## 2017-09-06 LAB — COMPREHENSIVE METABOLIC PANEL
ALT: 17 U/L (ref 0–55)
AST (SGOT): 18 U/L (ref 5–34)
Albumin/Globulin Ratio: 1 (ref 0.9–2.2)
Albumin: 3.3 g/dL — ABNORMAL LOW (ref 3.5–5.0)
Alkaline Phosphatase: 88 U/L (ref 38–106)
BUN: 9 mg/dL (ref 9.0–28.0)
Bilirubin, Total: 0.5 mg/dL (ref 0.2–1.2)
CO2: 26 mEq/L (ref 21–29)
Calcium: 9.3 mg/dL (ref 8.5–10.5)
Chloride: 102 mEq/L (ref 100–111)
Creatinine: 0.8 mg/dL (ref 0.5–1.5)
Globulin: 3.2 g/dL (ref 2.0–3.7)
Glucose: 87 mg/dL (ref 70–100)
Potassium: 3.8 mEq/L (ref 3.5–5.1)
Protein, Total: 6.5 g/dL (ref 6.0–8.3)
Sodium: 141 mEq/L (ref 136–145)

## 2017-09-06 LAB — HEMOLYSIS INDEX: Hemolysis Index: 41 — ABNORMAL HIGH (ref 0–18)

## 2017-09-06 LAB — GFR: EGFR: >60

## 2017-09-06 NOTE — Progress Notes (Signed)
Have you seen any specialists/other providers since your last visit with us?      Yes, ED    Arm preference verified?     Yes    The patient is due for nothing at this time, HM is up-to-date.

## 2017-09-06 NOTE — Progress Notes (Signed)
Subjective:      Patient ID: Christopher Trevino  is a 42 y.o.  male.     Christopher Trevino is a 42 y.o. male presenting for   Chief Complaint   Patient presents with   . Follow-up     pt reports colitis, tonsilectomy downward spiral before feeling better    . Lab Work     pt requests CBC & CMP        HPI    Problem   Status Post Tonsillectomy      The patient had surgery last Wednesday 08/31/17.  Thursday night the patient had constipation but then he developed tightness in his stomach then he felt nausea and then had bloody diarrhea.  The pain improved after this.  Then he had bright red bleeding from his rectum.  He was seen in the ER.   Negative for c. Diff, diagnosed with colitis.  GI suspects he may have developed colitis after all of the antibiotics for his tonsilitis.  His stool has become more firm and has not had bright red blood in his tool.   He has a lot of gas now and had a bowel movement which was normal.  He is taking Miralax, stool softener and a probiotic and drinking lots of water.    S/p tonsillectomy  The symptoms have improved, he feels they have worsened slightly as the ENT advised him it might. He is managing his pain with narcotic pain medicine and will try to transition to tylenol tomorrow.    The following portions of the patient's history were reviewed and updated as appropriate: current medications, allergies, past family history, past medical history, past social history, past surgical history and problem list.     Review of Systems   Constitutional: Negative for chills, diaphoresis and fever.   Eyes: Negative for visual disturbance.   Respiratory: Negative for cough and shortness of breath.    Cardiovascular: Negative for chest pain and palpitations.   Gastrointestinal: Positive for constipation. Negative for abdominal pain and blood in stool.   Genitourinary: Negative for dysuria and hematuria.   Musculoskeletal: Negative for arthralgias and myalgias.   Skin: Negative for color change.    Neurological: Negative for dizziness, syncope, light-headedness and headaches.   Psychiatric/Behavioral: Negative for confusion and dysphoric mood.         BP 125/88 (BP Site: Left arm, Patient Position: Sitting, Cuff Size: Medium)   Pulse 87   Temp 98.4 F (36.9 C) (Oral)   Wt 66.6 kg (146 lb 12.8 oz)   SpO2 96%   BMI 22.99 kg/m     Objective:     Physical Exam   Constitutional: He is oriented to person, place, and time. He appears well-developed and well-nourished. No distress.   HENT:   Head: Normocephalic and atraumatic.   Right Ear: External ear normal.   Left Ear: External ear normal.   Nose: Nose normal.   Mouth/Throat: Oropharynx is clear and moist.   Scarring status post tonsillectomy   Eyes: Pupils are equal, round, and reactive to light. Conjunctivae and EOM are normal.   Neck: Normal range of motion. Neck supple.   Cardiovascular: Normal rate, regular rhythm, normal heart sounds and intact distal pulses.    Pulmonary/Chest: Effort normal and breath sounds normal. No respiratory distress. He has no wheezes. He has no rales.   Abdominal: Soft. Bowel sounds are normal. There is no tenderness.   Musculoskeletal: Normal range of motion.   Neurological:  He is alert and oriented to person, place, and time.   Skin: Skin is warm and dry. He is not diaphoretic.   Psychiatric: He has a normal mood and affect.   Nursing note and vitals reviewed.        Assessment/Plan:     1. Status post tonsillectomy  Acute, stable  Recovering well after resolution of colitis symptoms  Pain is tolerable  Transition from oxycodone to tylenol as discussed   Follow up with ENT  Recheck CBC and CMP  - CBC without differential  - Comprehensive metabolic panel    2. Colitis  Acute, stable  Symptoms improving  Bowel movements more firm  No bright red blood  Follow up with GI    Call if you develop any new or worsening symptoms  Proceed to urgent care or ED after hours if necessary    Daisey Must, D.O.      Return if symptoms  worsen or fail to improve.

## 2017-09-09 NOTE — Telephone Encounter (Signed)
The labs were ordered at the office visit.

## 2017-10-04 ENCOUNTER — Encounter (INDEPENDENT_AMBULATORY_CARE_PROVIDER_SITE_OTHER): Payer: Self-pay

## 2017-11-03 ENCOUNTER — Encounter (INDEPENDENT_AMBULATORY_CARE_PROVIDER_SITE_OTHER): Payer: Self-pay

## 2017-11-17 ENCOUNTER — Encounter (INDEPENDENT_AMBULATORY_CARE_PROVIDER_SITE_OTHER): Payer: Self-pay | Admitting: Family Medicine

## 2017-11-22 ENCOUNTER — Encounter (INDEPENDENT_AMBULATORY_CARE_PROVIDER_SITE_OTHER): Payer: Self-pay | Admitting: Family Medicine

## 2017-11-22 ENCOUNTER — Telehealth (INDEPENDENT_AMBULATORY_CARE_PROVIDER_SITE_OTHER): Payer: Self-pay | Admitting: Family Medicine

## 2017-11-22 ENCOUNTER — Ambulatory Visit (INDEPENDENT_AMBULATORY_CARE_PROVIDER_SITE_OTHER): Payer: No Typology Code available for payment source | Admitting: Family Medicine

## 2017-11-22 NOTE — Telephone Encounter (Signed)
[place] a fractured tibia. They didn't know if the achillis was injured as well and recommended he get and MRI. Pt does have summary of visit to ER and will try uploading to his mychart. He wants to get the order for the MRI as soon as possible and wants to [facility] if he needs to come in for an appt to get the order. Please advise pt. He can be reached at [phone]. Thanks.

## 2017-11-22 NOTE — Telephone Encounter (Signed)
Please see below.

## 2017-11-24 ENCOUNTER — Encounter (INDEPENDENT_AMBULATORY_CARE_PROVIDER_SITE_OTHER): Payer: Self-pay | Admitting: Family Medicine

## 2017-11-28 NOTE — Telephone Encounter (Signed)
Yes he needs to come in for an appointment.

## 2017-11-28 NOTE — Telephone Encounter (Signed)
Patient aware no further questions.

## 2017-12-04 ENCOUNTER — Encounter (INDEPENDENT_AMBULATORY_CARE_PROVIDER_SITE_OTHER): Payer: Self-pay

## 2017-12-09 ENCOUNTER — Encounter (INDEPENDENT_AMBULATORY_CARE_PROVIDER_SITE_OTHER): Payer: Self-pay | Admitting: Family Medicine

## 2018-01-03 ENCOUNTER — Encounter (INDEPENDENT_AMBULATORY_CARE_PROVIDER_SITE_OTHER): Payer: Self-pay

## 2018-01-20 ENCOUNTER — Encounter (INDEPENDENT_AMBULATORY_CARE_PROVIDER_SITE_OTHER): Payer: Self-pay | Admitting: Family Medicine

## 2018-01-27 ENCOUNTER — Telehealth (INDEPENDENT_AMBULATORY_CARE_PROVIDER_SITE_OTHER): Payer: Self-pay | Admitting: Family Medicine

## 2018-01-27 DIAGNOSIS — G43909 Migraine, unspecified, not intractable, without status migrainosus: Secondary | ICD-10-CM

## 2018-01-27 NOTE — Telephone Encounter (Signed)
Medication refill:     SUMAtriptan (IMITREX) 100 MG tablet (Order [id number])       Sent to: [id number] - [place], [place] - [address]      Patient is out of town on vacation and needs this medication.     Please give him a call once sent to the pharmacy: [phone] (M)

## 2018-01-27 NOTE — Telephone Encounter (Signed)
Refill. LOV: 09/06/2017

## 2018-02-03 ENCOUNTER — Encounter (INDEPENDENT_AMBULATORY_CARE_PROVIDER_SITE_OTHER): Payer: Self-pay

## 2018-02-06 MED ORDER — SUMATRIPTAN SUCCINATE 100 MG PO TABS
100.00 mg | ORAL_TABLET | ORAL | 5 refills | Status: DC | PRN
Start: 2018-02-06 — End: 2018-06-26

## 2018-02-06 MED ORDER — SUMATRIPTAN SUCCINATE 100 MG PO TABS
100.00 mg | ORAL_TABLET | ORAL | 5 refills | Status: DC | PRN
Start: 2018-02-06 — End: 2018-02-06

## 2018-02-06 NOTE — Telephone Encounter (Signed)
Prescription sent to pharmacy.

## 2018-02-07 NOTE — Telephone Encounter (Signed)
Left message to let pt know medication has been sent and to callback for questions or concerns.

## 2018-03-06 ENCOUNTER — Encounter (INDEPENDENT_AMBULATORY_CARE_PROVIDER_SITE_OTHER): Payer: Self-pay

## 2018-03-17 ENCOUNTER — Encounter (INDEPENDENT_AMBULATORY_CARE_PROVIDER_SITE_OTHER): Payer: Self-pay | Admitting: Family Medicine

## 2018-03-17 ENCOUNTER — Ambulatory Visit (INDEPENDENT_AMBULATORY_CARE_PROVIDER_SITE_OTHER): Payer: No Typology Code available for payment source | Admitting: Family Medicine

## 2018-03-17 VITALS — BP 131/82 | HR 76 | Temp 98.0°F | Wt 151.0 lb

## 2018-03-17 DIAGNOSIS — R0789 Other chest pain: Secondary | ICD-10-CM

## 2018-03-17 DIAGNOSIS — R030 Elevated blood-pressure reading, without diagnosis of hypertension: Secondary | ICD-10-CM

## 2018-03-17 NOTE — Progress Notes (Signed)
Subjective:      Patient ID: Christopher Trevino  is a 42 y.o.  male.     Christopher Trevino is a 42 y.o. male presenting for   Chief Complaint   Patient presents with   . Muscle Pain     c/o on chest since Wednesday. L arm pain   . Night Sweats       HPI    Chest pain  The blood pressure was high when he went in to the [facility] ER and he was having chest pain.  He had chest tightness on both sides which went to his left arm and left back.  No acute social stressors at this time and work is low stress right now.  No precipitating events, everything has been fine.  Symptoms resolved after 4 hours, he felt very fatigued afterwards and drained the rest of the day.      Has had multiple health problems this year.    The following portions of the patient's history were reviewed and updated as appropriate: current medications, allergies, past family history, past medical history, past social history, past surgical history and problem list.     Review of Systems   Constitutional: Negative for chills, diaphoresis and fever.   Eyes: Negative for visual disturbance.   Respiratory: Negative for cough and shortness of breath.    Cardiovascular: Negative for chest pain and palpitations.   Gastrointestinal: Negative for abdominal pain and blood in stool.   Genitourinary: Negative for dysuria and hematuria.   Musculoskeletal: Negative for arthralgias and myalgias.   Skin: Negative for color change.   Neurological: Negative for dizziness, syncope, light-headedness and headaches.   Psychiatric/Behavioral: Negative for confusion and dysphoric mood.         BP 131/82 (BP Site: Left arm, Patient Position: Sitting, Cuff Size: Medium)   Pulse 76   Temp 98 F (36.7 C) (Oral)   Wt 68.5 kg (151 lb)   SpO2 98%   BMI 23.65 kg/m     Objective:     Physical Exam   Constitutional: He is oriented to person, place, and time. He appears well-developed and well-nourished. No distress.   HENT:   Head: Normocephalic and atraumatic.   Right Ear:  External ear normal.   Left Ear: External ear normal.   Nose: Nose normal.   Mouth/Throat: Oropharynx is clear and moist.   Eyes: Pupils are equal, round, and reactive to light. Conjunctivae and EOM are normal.   Neck: Normal range of motion. Neck supple.   Cardiovascular: Normal rate, regular rhythm, normal heart sounds and intact distal pulses.    Pulmonary/Chest: Effort normal and breath sounds normal. No respiratory distress. He has no wheezes. He has no rales.   Abdominal: Soft. Bowel sounds are normal. There is no tenderness.   Musculoskeletal: Normal range of motion.   Neurological: He is alert and oriented to person, place, and time.   Skin: Skin is warm and dry. He is not diaphoretic.   Psychiatric: He has a normal mood and affect.   Nursing note and vitals reviewed.        Assessment/Plan:     1. Chest tightness  The patient is not having any active symptoms of chest tightness or chest pain  The patient has a history of elevated blood pressure readings in our office  Check home blood pressure readings twice a day for 2 weeks  The patient's blood pressure was very high when he went to the  hospital.  His symptoms may have been secondary to hypertension  ER precautions are discussed  Will bring his blood pressure diary to the office in 2 weeks for review  He will stop caffeine use while he is checking his blood pressure    2. Elevated blood pressure reading  The patient is not having any active symptoms of chest tightness or chest pain  The patient has a history of elevated blood pressure readings in our office  Check home blood pressure readings twice a day for 2 weeks  The patient's blood pressure was very high when he went to the hospital.  His symptoms may have been secondary to hypertension  ER precautions are discussed  Will bring his blood pressure diary to the office in 2 weeks for review  He will stop caffeine use while he is checking his blood pressure    Risk & Benefits of the new medication(s)  were explained to the patient (and family) who verbalized understanding & agreed to the treatment plan. Patient (family) encouraged to contact me/clinical staff with any questions/concerns.    Call if you develop any new or worsening symptoms  Proceed to urgent care or ED after hours if necessary    Daisey Must, D.O.      Return in about 2 weeks (around 03/31/2018) for Blood pressure diary, Hypertension.

## 2018-03-17 NOTE — Progress Notes (Signed)
Have you seen any specialists/other providers since your last visit with us?    No    Arm preference verified?   Yes    The patient is due for influenza vaccine

## 2018-03-31 ENCOUNTER — Encounter (INDEPENDENT_AMBULATORY_CARE_PROVIDER_SITE_OTHER): Payer: Self-pay | Admitting: Family Medicine

## 2018-03-31 ENCOUNTER — Ambulatory Visit (INDEPENDENT_AMBULATORY_CARE_PROVIDER_SITE_OTHER): Payer: No Typology Code available for payment source | Admitting: Family Medicine

## 2018-03-31 VITALS — BP 136/80 | HR 75 | Temp 98.2°F | Wt 149.6 lb

## 2018-03-31 DIAGNOSIS — R030 Elevated blood-pressure reading, without diagnosis of hypertension: Secondary | ICD-10-CM

## 2018-03-31 DIAGNOSIS — R0789 Other chest pain: Secondary | ICD-10-CM

## 2018-03-31 NOTE — Progress Notes (Signed)
Subjective:      Patient ID: Christopher Trevino  is a 42 y.o.  male.     Christopher Trevino is a 42 y.o. male presenting for   Chief Complaint   Patient presents with   . Hypertension     c/o hypertension f/u       HPI    History of elevated blood pressure  The blood pressure is well controlled for the past few weeks.  He has not had any chest pain, lightheadedness, dizziness, or pain in his arm.  The pain that he did have his resolved and only rarely becomes 1 out of 10 if compressed against something.  He would not like to take any medication or have any additional testing at this time.    Our office visit  Chest pain  The blood pressure was high when he went in to the [facility] ER and he was having chest pain.  He had chest tightness on both sides which went to his left arm and left back.  No acute social stressors at this time and work is low stress right now.  No precipitating events, everything has been fine.  Symptoms resolved after 4 hours, he felt very fatigued afterwards and drained the rest of the day.      Has had multiple health problems this year.    The following portions of the patient's history were reviewed and updated as appropriate: current medications, allergies, past family history, past medical history, past social history, past surgical history and problem list.     Review of Systems   Constitutional: Negative for chills, diaphoresis and fever.   Eyes: Negative for visual disturbance.   Respiratory: Negative for cough and shortness of breath.    Cardiovascular: Negative for chest pain and palpitations.   Gastrointestinal: Negative for abdominal pain and blood in stool.   Genitourinary: Negative for dysuria and hematuria.   Musculoskeletal: Negative for arthralgias and myalgias.   Skin: Negative for color change.   Neurological: Negative for dizziness, syncope, light-headedness and headaches.   Psychiatric/Behavioral: Negative for confusion and dysphoric mood.         BP 136/80 (BP Site: Left  arm, Patient Position: Sitting, Cuff Size: Medium)   Pulse 75   Temp 98.2 F (36.8 C) (Oral)   Wt 67.9 kg (149 lb 9.6 oz)   SpO2 98%   BMI 23.43 kg/m     Objective:     Physical Exam   Constitutional: He is oriented to person, place, and time. He appears well-developed and well-nourished. No distress.   HENT:   Head: Normocephalic and atraumatic.   Right Ear: External ear normal.   Left Ear: External ear normal.   Nose: Nose normal.   Mouth/Throat: Oropharynx is clear and moist.   Eyes: Pupils are equal, round, and reactive to light. Conjunctivae and EOM are normal.   Neck: Normal range of motion. Neck supple.   Cardiovascular: Normal rate, regular rhythm, normal heart sounds and intact distal pulses.   Pulmonary/Chest: Effort normal and breath sounds normal. No respiratory distress. He has no wheezes. He has no rales.   Abdominal: Soft. Bowel sounds are normal. There is no tenderness. Musculoskeletal: Normal range of motion.     Neurological: He is alert and oriented to person, place, and time.   Skin: Skin is warm and dry. He is not diaphoretic.   Psychiatric: He has a normal mood and affect.   Nursing note and vitals reviewed.  Assessment/Plan:     1. Elevated blood pressure reading  -Resolved  -Monitor symptoms  -Return for new or worsening symptoms.    2. Chest tightness  -Resolved  -Monitor symptoms  -Return for new or worsening symptoms.      Risk & Benefits of the new medication(s) were explained to the patient (and family) who verbalized understanding & agreed to the treatment plan. Patient (family) encouraged to contact me/clinical staff with any questions/concerns.    Call if you develop any new or worsening symptoms  Proceed to urgent care or ED after hours if necessary    Daisey Must, D.O.      No follow-ups on file.

## 2018-03-31 NOTE — Progress Notes (Signed)
Have you seen any specialists/other providers since your last visit with us?    No    Arm preference verified?   Yes    The patient is due for influenza vaccine

## 2018-04-12 ENCOUNTER — Encounter (INDEPENDENT_AMBULATORY_CARE_PROVIDER_SITE_OTHER): Payer: Self-pay | Admitting: Family Medicine

## 2018-06-05 ENCOUNTER — Encounter (INDEPENDENT_AMBULATORY_CARE_PROVIDER_SITE_OTHER): Payer: Self-pay

## 2018-06-14 ENCOUNTER — Encounter (INDEPENDENT_AMBULATORY_CARE_PROVIDER_SITE_OTHER): Payer: Self-pay

## 2018-06-26 ENCOUNTER — Other Ambulatory Visit (INDEPENDENT_AMBULATORY_CARE_PROVIDER_SITE_OTHER): Payer: Self-pay | Admitting: Family Medicine

## 2018-06-26 DIAGNOSIS — G43909 Migraine, unspecified, not intractable, without status migrainosus: Secondary | ICD-10-CM

## 2018-06-26 MED ORDER — SUMATRIPTAN SUCCINATE 100 MG PO TABS
100.00 mg | ORAL_TABLET | ORAL | 5 refills | Status: DC | PRN
Start: 2018-06-26 — End: 2018-11-23

## 2018-06-26 MED ORDER — SUMATRIPTAN SUCCINATE 100 MG PO TABS
100.00 mg | ORAL_TABLET | ORAL | 11 refills | Status: DC | PRN
Start: 2018-06-26 — End: 2018-11-23

## 2018-07-05 ENCOUNTER — Encounter (INDEPENDENT_AMBULATORY_CARE_PROVIDER_SITE_OTHER): Payer: Self-pay

## 2018-07-06 ENCOUNTER — Encounter (INDEPENDENT_AMBULATORY_CARE_PROVIDER_SITE_OTHER): Payer: Self-pay

## 2018-08-09 ENCOUNTER — Encounter (INDEPENDENT_AMBULATORY_CARE_PROVIDER_SITE_OTHER): Payer: Self-pay

## 2018-08-16 ENCOUNTER — Encounter (INDEPENDENT_AMBULATORY_CARE_PROVIDER_SITE_OTHER): Payer: Self-pay | Admitting: Family Medicine

## 2018-08-22 ENCOUNTER — Encounter (INDEPENDENT_AMBULATORY_CARE_PROVIDER_SITE_OTHER): Payer: Self-pay | Admitting: Family Medicine

## 2018-08-22 ENCOUNTER — Ambulatory Visit (INDEPENDENT_AMBULATORY_CARE_PROVIDER_SITE_OTHER): Payer: No Typology Code available for payment source | Admitting: Family Medicine

## 2018-08-22 VITALS — BP 123/79 | HR 66 | Temp 98.1°F | Wt 153.8 lb

## 2018-08-22 DIAGNOSIS — F909 Attention-deficit hyperactivity disorder, unspecified type: Secondary | ICD-10-CM

## 2018-08-22 MED ORDER — METHYLPHENIDATE HCL ER (OSM) 18 MG PO TBCR
18.00 mg | EXTENDED_RELEASE_TABLET | Freq: Every day | ORAL | 0 refills | Status: DC
Start: 2018-08-22 — End: 2018-09-26

## 2018-08-22 NOTE — Progress Notes (Signed)
Have you seen any specialists/other providers since your last visit with us?    Yes, Ortho    Arm preference verified?   Yes    The patient is due for influenza vaccine

## 2018-08-22 NOTE — Progress Notes (Signed)
Subjective:      Patient ID:  Christopher Trevino is a 43  y/o male     Christopher Trevino is a 43  y/o male  presenting for ADHD      HPI    Attention deficit and hyperactivity disorder  Previously the patient was on Adderall XR 10 mg, 15 mg; however, he states that this medication has not been that effective for him because he really notices when the medication starts up and wears off and this is disruptive for him.  He would like to try an alternative ADHD medication that does not have such a noticeable starting and stopping affect.      The following portions of the patient's history were reviewed and updated as appropriate: current medications, allergies, past family history, past medical history, past social history, past surgical history and problem list.     Review of Systems   Constitutional: Negative for chills, diaphoresis and fever.   Eyes: Negative for visual disturbance.   Respiratory: Negative for cough and shortness of breath.    Cardiovascular: Negative for chest pain, palpitations and leg swelling.   Gastrointestinal: Negative for abdominal pain and blood in stool.   Genitourinary: Negative for dysuria, hematuria, pelvic pain and vaginal bleeding.   Skin: Negative for color change.   Neurological: Negative for dizziness, syncope, light-headedness and headaches.   Psychiatric/Behavioral: Negative for confusion and dysphoric mood.       BP 123/79 (BP Site: Left arm, Patient Position: Sitting, Cuff Size: Medium)    Pulse 66    Temp 98.1 F (36.7 C) (Oral)    Wt 69.8 kg (153 lb 12.8 oz)    SpO2 99%    BMI 24.09 kg/m     Objective:     Physical Exam   Constitutional: She is oriented to person, place, and time. She appears well-developed and well-nourished. No distress.   HENT:   Head: Normocephalic and atraumatic.   Right Ear: External ear normal.   Left Ear: External ear normal.   Nose: Nose normal.   Mouth/Throat: Oropharynx is clear and moist.   Eyes: Conjunctivae and EOM are normal. Pupils are equal, round,  and reactive to light.   Neck: Normal range of motion. Neck supple.   Cardiovascular: Normal rate, regular rhythm, normal heart sounds and intact distal pulses.    Pulmonary/Chest: Effort normal and breath sounds normal.   Abdominal: Soft. Bowel sounds are normal. There is no tenderness.   Musculoskeletal: Normal range of motion.   Lymphadenopathy:     She has no cervical adenopathy.   Neurological: She is alert and oriented to person, place, and time. She has normal reflexes.   Skin: Skin is warm and dry. She is not diaphoretic.   Psychiatric: She has a normal mood and affect.   Unable to focus during conversation and pay attention when spoken to directly.  Fidgeting and restlessness during exam   Nursing note and vitals reviewed.        Assessment/Plan:     1. Attention deficit hyperactivity disorder (ADHD), unspecified ADHD type  - methylphenidate (CONCERTA) 18 MG CR tablet; Take 1 tablet (18 mg total) by mouth daily  Dispense: 30 tablet; Refill: 0  Will start the patient on Concerta 18 mg and taper the dose up as needed.  He should notice less of a starting up and wearing off effect with the controlled release methylphenidate    Call if you develop any new or worsening symptoms  Proceed to urgent  care or ED after hours if necessary    Daisey Must, D.O.      Follow up in 1 month started on Concerta 18 mg instead of Adderall

## 2018-08-29 ENCOUNTER — Encounter (INDEPENDENT_AMBULATORY_CARE_PROVIDER_SITE_OTHER): Payer: Self-pay

## 2018-09-03 ENCOUNTER — Encounter (INDEPENDENT_AMBULATORY_CARE_PROVIDER_SITE_OTHER): Payer: Self-pay

## 2018-09-04 ENCOUNTER — Encounter (INDEPENDENT_AMBULATORY_CARE_PROVIDER_SITE_OTHER): Payer: Self-pay

## 2018-09-19 ENCOUNTER — Encounter (INDEPENDENT_AMBULATORY_CARE_PROVIDER_SITE_OTHER): Payer: Self-pay

## 2018-09-26 ENCOUNTER — Other Ambulatory Visit (INDEPENDENT_AMBULATORY_CARE_PROVIDER_SITE_OTHER): Payer: Self-pay | Admitting: Family Medicine

## 2018-09-26 DIAGNOSIS — F909 Attention-deficit hyperactivity disorder, unspecified type: Secondary | ICD-10-CM

## 2018-09-26 MED ORDER — METHYLPHENIDATE HCL ER (OSM) 18 MG PO TBCR
18.00 mg | EXTENDED_RELEASE_TABLET | Freq: Every day | ORAL | 0 refills | Status: AC
Start: 2018-09-26 — End: 2018-10-26

## 2018-10-04 ENCOUNTER — Encounter (INDEPENDENT_AMBULATORY_CARE_PROVIDER_SITE_OTHER): Payer: Self-pay

## 2018-10-05 ENCOUNTER — Encounter (INDEPENDENT_AMBULATORY_CARE_PROVIDER_SITE_OTHER): Payer: Self-pay

## 2018-11-03 ENCOUNTER — Encounter (INDEPENDENT_AMBULATORY_CARE_PROVIDER_SITE_OTHER): Payer: Self-pay

## 2018-11-06 ENCOUNTER — Encounter (INDEPENDENT_AMBULATORY_CARE_PROVIDER_SITE_OTHER): Payer: Self-pay

## 2018-11-22 ENCOUNTER — Other Ambulatory Visit (INDEPENDENT_AMBULATORY_CARE_PROVIDER_SITE_OTHER): Payer: Self-pay | Admitting: Family Medicine

## 2018-11-22 DIAGNOSIS — F909 Attention-deficit hyperactivity disorder, unspecified type: Secondary | ICD-10-CM

## 2018-11-22 DIAGNOSIS — G43909 Migraine, unspecified, not intractable, without status migrainosus: Secondary | ICD-10-CM

## 2018-11-23 ENCOUNTER — Other Ambulatory Visit (INDEPENDENT_AMBULATORY_CARE_PROVIDER_SITE_OTHER): Payer: Self-pay | Admitting: Family Medicine

## 2018-11-23 DIAGNOSIS — G43909 Migraine, unspecified, not intractable, without status migrainosus: Secondary | ICD-10-CM

## 2018-11-23 MED ORDER — SUMATRIPTAN SUCCINATE 100 MG PO TABS
100.00 mg | ORAL_TABLET | ORAL | 4 refills | Status: DC | PRN
Start: 2018-11-23 — End: 2019-02-05

## 2018-11-23 NOTE — Telephone Encounter (Signed)
I will refill sumatriptan.  But will not refill Concerta as he was prescribed return 07/2018 and looks like he did not refill it on April/May per PMP.  Please clarify with the patient if he has been taking Concerta daily basis and not?

## 2018-11-23 NOTE — Telephone Encounter (Signed)
Calling pt and he states he refilled his med on February, not taking it on weekends or when he does not work. Pt made video appt to discuss this with you for 11/27/18

## 2018-11-27 ENCOUNTER — Encounter (INDEPENDENT_AMBULATORY_CARE_PROVIDER_SITE_OTHER): Payer: Self-pay | Admitting: Family Medicine

## 2018-11-27 ENCOUNTER — Telehealth (INDEPENDENT_AMBULATORY_CARE_PROVIDER_SITE_OTHER): Payer: Self-pay | Admitting: Family Medicine

## 2018-11-27 ENCOUNTER — Telehealth (INDEPENDENT_AMBULATORY_CARE_PROVIDER_SITE_OTHER): Payer: No Typology Code available for payment source | Admitting: Family Medicine

## 2018-11-27 DIAGNOSIS — G43909 Migraine, unspecified, not intractable, without status migrainosus: Secondary | ICD-10-CM

## 2018-11-27 DIAGNOSIS — G43809 Other migraine, not intractable, without status migrainosus: Secondary | ICD-10-CM | POA: Insufficient documentation

## 2018-11-27 DIAGNOSIS — F909 Attention-deficit hyperactivity disorder, unspecified type: Secondary | ICD-10-CM | POA: Insufficient documentation

## 2018-11-27 MED ORDER — CONCERTA 18 MG PO TBCR
18.00 mg | EXTENDED_RELEASE_TABLET | Freq: Every morning | ORAL | 0 refills | Status: DC
Start: 2018-11-27 — End: 2019-05-21

## 2018-11-27 NOTE — Progress Notes (Signed)
Subjective:      Patient ID: Christopher Trevino  is a 43 y.o.  male.       Chief Complaint   Patient presents with    Follow-up     Patient is a telemedicine visit to follow-up ADHD, migraine headache.  Requesting medication refill today.       HPI    Christopher Trevino is on telemed visit to follow-up on ADHD/ADD, migraine headache.    ADHD/ADD, doing well on Concerta.  Patient reports he was diagnosed with ADD/ADHD since age 10 and has been taking stimulant medication intermittently for several years.  Recently he was restarted on Concerta by previous PCP since 07/2018.  He reports since he started on Concerta his symptoms much more well controlled and he has been tolerating Concerta well.  He did not tolerated Adderall well.  He reports he is more focused at work and able to finish his assignment in timely manners.  He noted less careless mistakes.  He would like to continue Concerta.  According to PMP last refill was done 09/26/2018 and continued left over medication (he does not take medicine over the weekend, holidays time) until now.  He is requesting refill of Concerta.    Migraine headache, intermittent flare.  He usually takes Imitrex 100 mg once only as needed whenever he gets migraine headache flare.  He usually gets migraine headache flare once every other month.  No other associated symptoms.  He does not want to see a neurologist at this time.    The following portions of the patient's history were reviewed and updated as appropriate: current medications, allergies, past family history, past medical history, past social history, past surgical history and problem list.     Review of Systems   Constitutional: Negative for activity change, appetite change, fatigue and unexpected weight change.   HENT: Negative for congestion.    Eyes: Negative for visual disturbance.   Respiratory: Negative for cough, chest tightness, shortness of breath and wheezing.    Cardiovascular: Negative for chest pain, palpitations and  leg swelling.   Gastrointestinal: Negative for abdominal pain.   Genitourinary: Negative for difficulty urinating.   Musculoskeletal: Negative for arthralgias and gait problem.   Neurological: Negative for dizziness, syncope, weakness, light-headedness and numbness.        Positive for migraine headache flare intermittently.   Hematological: Negative for adenopathy.   Psychiatric/Behavioral: Positive for decreased concentration. Negative for behavioral problems, dysphoric mood, hallucinations, sleep disturbance and suicidal ideas. The patient is not nervous/anxious and is not hyperactive.         Positive for ADD/ADHD.          There were no vitals taken for this visit.    Objective:     Physical Exam   Constitutional-looks comfortable.  Well-nourished.  No acute distress noted.  Alert and oriented x3.  HEENT-conjunctival looks normal.  EOM-normal  Neck-no goiter noted  Respiratory-breathing comfortably.  No breathing difficulties noted.  No cough noted.  Psych-mood and affect normal.  Behavior normal.  Thought content and judgment normal.  Good eye contact.       Assessment/Plan:     ADD/ADHD, well-controlled symptoms on Concerta.  He takes this medication on weekdays only.  I have reviewed potential side effect of this medication including chronic abuse can lead to marked tolerance and psychological dependence with the various degree of abnormal behavior, frank psychotic episode can occur, especially with parenteral abuse.  Patient verbally understands.  Patient denies  any drug dependence or alcoholism problem.   -I have recommended patient to be evaluated by psychiatrist for evaluation of ADD/ADHD and any other underlying psychiatric illness.  Patient agreed to schedule appointment.  Psychiatry referral order placed and provided contact information of  behavioral health.  Patient agreeable to schedule appointment.  -Patient agreed to come back to our clinic for urine drug test in to sign clinical substance  contract.    Migraine headache, stable symptoms with intermittent flare.  Patient takes Imitrex only as needed with good benefit.  I have reviewed that he cannot take more than 100 mg of Imitrex per day.  He understands.  Currently asymptomatic.  He does not want to see neurologist at this time.    Call if you develop any new or worsening symptoms  Proceed to urgent care or ED after hours if necessary    Selina Tapper, MD        Return in about 3 months (around 02/27/2019) for Follow-up in 3 months.Marland Kitchen

## 2018-11-27 NOTE — Telephone Encounter (Signed)
Please call patient and remind him about the urine drug test and to sign controlled substance contract.  Urine drug test order placed.  Next visit a should be in 3 months.

## 2018-11-27 NOTE — Progress Notes (Signed)
Have you seen any specialists/other providers since your last visit with us?      No      Arm preference verified?     Yes    The patient is due for depression screening

## 2018-11-28 NOTE — Telephone Encounter (Signed)
Called patient and left message for him to call and schedule lab appointment for urine test

## 2018-11-30 NOTE — Telephone Encounter (Signed)
Refilled requesting medication following telemedicine visit on 11/27/2018.

## 2018-12-04 ENCOUNTER — Encounter (INDEPENDENT_AMBULATORY_CARE_PROVIDER_SITE_OTHER): Payer: Self-pay

## 2018-12-05 ENCOUNTER — Encounter (INDEPENDENT_AMBULATORY_CARE_PROVIDER_SITE_OTHER): Payer: Self-pay

## 2018-12-12 ENCOUNTER — Encounter (INDEPENDENT_AMBULATORY_CARE_PROVIDER_SITE_OTHER): Payer: Self-pay

## 2019-01-03 ENCOUNTER — Encounter (INDEPENDENT_AMBULATORY_CARE_PROVIDER_SITE_OTHER): Payer: Self-pay

## 2019-01-04 ENCOUNTER — Encounter (INDEPENDENT_AMBULATORY_CARE_PROVIDER_SITE_OTHER): Payer: Self-pay

## 2019-02-03 ENCOUNTER — Encounter (INDEPENDENT_AMBULATORY_CARE_PROVIDER_SITE_OTHER): Payer: Self-pay

## 2019-02-04 ENCOUNTER — Encounter (INDEPENDENT_AMBULATORY_CARE_PROVIDER_SITE_OTHER): Payer: Self-pay

## 2019-02-05 ENCOUNTER — Other Ambulatory Visit (INDEPENDENT_AMBULATORY_CARE_PROVIDER_SITE_OTHER): Payer: Self-pay | Admitting: Family Medicine

## 2019-02-05 DIAGNOSIS — G43909 Migraine, unspecified, not intractable, without status migrainosus: Secondary | ICD-10-CM

## 2019-02-05 MED ORDER — SUMATRIPTAN SUCCINATE 100 MG PO TABS
100.0000 mg | ORAL_TABLET | ORAL | 4 refills | Status: DC | PRN
Start: 2019-02-05 — End: 2019-04-18

## 2019-03-06 ENCOUNTER — Encounter (INDEPENDENT_AMBULATORY_CARE_PROVIDER_SITE_OTHER): Payer: Self-pay

## 2019-04-06 ENCOUNTER — Encounter (INDEPENDENT_AMBULATORY_CARE_PROVIDER_SITE_OTHER): Payer: Self-pay

## 2019-04-18 ENCOUNTER — Other Ambulatory Visit (INDEPENDENT_AMBULATORY_CARE_PROVIDER_SITE_OTHER): Payer: Self-pay | Admitting: Family Medicine

## 2019-04-18 DIAGNOSIS — G43909 Migraine, unspecified, not intractable, without status migrainosus: Secondary | ICD-10-CM

## 2019-04-18 MED ORDER — SUMATRIPTAN SUCCINATE 100 MG PO TABS
100.0000 mg | ORAL_TABLET | Freq: Every day | ORAL | 4 refills | Status: DC | PRN
Start: 2019-04-18 — End: 2019-07-11

## 2019-05-11 ENCOUNTER — Encounter (INDEPENDENT_AMBULATORY_CARE_PROVIDER_SITE_OTHER): Payer: No Typology Code available for payment source | Admitting: Family Medicine

## 2019-05-21 ENCOUNTER — Encounter (INDEPENDENT_AMBULATORY_CARE_PROVIDER_SITE_OTHER): Payer: Self-pay | Admitting: Internal Medicine

## 2019-05-21 ENCOUNTER — Encounter (INDEPENDENT_AMBULATORY_CARE_PROVIDER_SITE_OTHER): Payer: Self-pay | Admitting: Family Medicine

## 2019-05-21 ENCOUNTER — Ambulatory Visit (INDEPENDENT_AMBULATORY_CARE_PROVIDER_SITE_OTHER): Payer: No Typology Code available for payment source | Admitting: Internal Medicine

## 2019-05-21 VITALS — BP 132/91 | HR 75 | Temp 98.1°F | Wt 154.6 lb

## 2019-05-21 DIAGNOSIS — Z0000 Encounter for general adult medical examination without abnormal findings: Secondary | ICD-10-CM

## 2019-05-21 DIAGNOSIS — F1099 Alcohol use, unspecified with unspecified alcohol-induced disorder: Secondary | ICD-10-CM

## 2019-05-21 DIAGNOSIS — R030 Elevated blood-pressure reading, without diagnosis of hypertension: Secondary | ICD-10-CM

## 2019-05-21 DIAGNOSIS — F909 Attention-deficit hyperactivity disorder, unspecified type: Secondary | ICD-10-CM

## 2019-05-21 DIAGNOSIS — Z23 Encounter for immunization: Secondary | ICD-10-CM

## 2019-05-21 LAB — CBC AND DIFFERENTIAL
Absolute NRBC: 0 10*3/uL (ref 0.00–0.00)
Basophils Absolute Automated: 0.05 10*3/uL (ref 0.00–0.08)
Basophils Automated: 0.9 %
Eosinophils Absolute Automated: 0.06 10*3/uL (ref 0.00–0.44)
Eosinophils Automated: 1.1 %
Hematocrit: 46.1 % (ref 37.6–49.6)
Hgb: 15.5 g/dL (ref 12.5–17.1)
Immature Granulocytes Absolute: 0.01 10*3/uL (ref 0.00–0.07)
Immature Granulocytes: 0.2 %
Lymphocytes Absolute Automated: 1.72 10*3/uL (ref 0.42–3.22)
Lymphocytes Automated: 32.5 %
MCH: 30.9 pg (ref 25.1–33.5)
MCHC: 33.6 g/dL (ref 31.5–35.8)
MCV: 92 fL (ref 78.0–96.0)
MPV: 11.3 fL (ref 8.9–12.5)
Monocytes Absolute Automated: 0.44 10*3/uL (ref 0.21–0.85)
Monocytes: 8.3 %
Neutrophils Absolute: 3.02 10*3/uL (ref 1.10–6.33)
Neutrophils: 57 %
Nucleated RBC: 0 /100 WBC (ref 0.0–0.0)
Platelets: 232 10*3/uL (ref 142–346)
RBC: 5.01 10*6/uL (ref 4.20–5.90)
RDW: 12 % (ref 11–15)
WBC: 5.3 10*3/uL (ref 3.10–9.50)

## 2019-05-21 LAB — URINALYSIS
Bilirubin, UA: NEGATIVE
Blood, UA: NEGATIVE
Glucose, UA: NEGATIVE
Ketones UA: NEGATIVE
Leukocyte Esterase, UA: NEGATIVE
Nitrite, UA: NEGATIVE
Protein, UR: NEGATIVE
Specific Gravity UA: 1.007 (ref 1.001–1.035)
Urine pH: 7 (ref 5.0–8.0)
Urobilinogen, UA: 0.2 (ref 0.2–2.0)

## 2019-05-21 LAB — COMPREHENSIVE METABOLIC PANEL
ALT: 18 U/L (ref 0–55)
AST (SGOT): 19 U/L (ref 5–34)
Albumin/Globulin Ratio: 1.6 (ref 0.9–2.2)
Albumin: 4.3 g/dL (ref 3.5–5.0)
Alkaline Phosphatase: 75 U/L (ref 38–106)
Anion Gap: 10 (ref 5.0–15.0)
BUN: 17 mg/dL (ref 9.0–28.0)
Bilirubin, Total: 0.8 mg/dL (ref 0.2–1.2)
CO2: 25 mEq/L (ref 21–29)
Calcium: 9.4 mg/dL (ref 8.5–10.5)
Chloride: 104 mEq/L (ref 100–111)
Creatinine: 0.9 mg/dL (ref 0.5–1.5)
Globulin: 2.7 g/dL (ref 2.0–3.7)
Glucose: 101 mg/dL — ABNORMAL HIGH (ref 70–100)
Potassium: 4.4 mEq/L (ref 3.5–5.1)
Protein, Total: 7 g/dL (ref 6.0–8.3)
Sodium: 139 mEq/L (ref 136–145)

## 2019-05-21 LAB — GFR: EGFR: >60

## 2019-05-21 LAB — LIPID PANEL
Cholesterol / HDL Ratio: 2.5
Cholesterol: 194 mg/dL (ref 0–199)
HDL: 79 mg/dL (ref 40–9999)
LDL Calculated: 100 mg/dL — AB (ref 0–99)
Triglycerides: 73 mg/dL (ref 34–149)
VLDL Calculated: 15 mg/dL (ref 10–40)

## 2019-05-21 LAB — HEMOGLOBIN A1C
Average Estimated Glucose: 99.7 mg/dL
Hemoglobin A1C: 5.1 % (ref 4.6–5.9)

## 2019-05-21 LAB — HEMOLYSIS INDEX: Hemolysis Index: 6 (ref 0–18)

## 2019-05-21 LAB — TSH: TSH: 0.67 u[IU]/mL (ref 0.35–4.94)

## 2019-05-21 MED ORDER — CONCERTA 18 MG PO TBCR
18.00 mg | EXTENDED_RELEASE_TABLET | Freq: Every morning | ORAL | 0 refills | Status: DC
Start: 2019-05-21 — End: 2019-06-26

## 2019-05-21 NOTE — Progress Notes (Signed)
Have you seen any specialists/other providers since your last visit with [place]?    No    Arm preference verified?   Yes    The patient is due for influenza vaccine   Wants flu shot today

## 2019-05-21 NOTE — Progress Notes (Signed)
Subjective:      Patient ID: Christopher Trevino is a 43 y.o. male.    Chief Complaint:  Chief Complaint   Patient presents with    Annual Exam     pt is fasting        HPI   [name], is presenting in clinic today for annual physical examination.  Patient is overall doing well and taking care of his health with healthy diet and also doing regular exercises.  He also requests a refill for his ADD medication and reports he has been using that since childhood.    Visit Type: Health Maintenance Visit  Work Status: working full-time [employer]  Reported Health: excellent health  Reported Diet: moderate compliance with well-balanced diet  Reported Exercise: regularly, 15-30 minutes/day, Biking and walking  Dental: regular dental visits twice a year  Vision: eye exam > 1 year ago  Hearing: normal hearing  Immunization Status: immunizations up to date and Influenza vaccination due  Reproductive Health: sexually active  Prior Screening Tests: last colonoscopy in 2019 and colon cancer screening not approriate at this time  General Health Risks: no family history of prostate cancer and no family history of colon cancer  Safety Elements Used: uses seat belts, sunscreen use and does not text and drive    Problem List:  Patient Active Problem List   Diagnosis    Peritonsillar abscess    Hyperglycemia    History of migraine    Lower abdominal pain    Colitis    Rectal bleeding    Status post tonsillectomy    Attention deficit hyperactivity disorder (ADHD), unspecified ADHD type    Migraine without status migrainosus, not intractable, unspecified migraine type    Alcohol use, unspecified with unspecified alcohol-induced disorder       Current Medications:  No outpatient medications have been marked as taking for the 05/21/19 encounter (Office Visit) with Leanthony Rhett, Staci Acosta, MD.       Allergies:  No Known Allergies    Past Medical History:  Past Medical History:   Diagnosis Date    History of migraine     LAST MIGRANE Aug 17 2017  EVEN 1 BEER OR GLASS OF WINE BRINGS IT ON       Past Surgical History:  Past Surgical History:   Procedure Laterality Date    INCISION & DRAINAGE, RETROPHARYNGEAL ABSCESS Left 07/01/2017    Procedure: INCISION & DRAINAGE, RETROPHARYNGEAL ABSCESS;  Surgeon: Francis Gaines, MD;  Location: [facility];  Service: ENT;  Laterality: Left;    NASAL SEPTUM SURGERY  AGE 60    TONSILLECTOMY N/A 08/31/2017    Procedure: TONSILLECTOMY AND EXCISION OF FRENULUM LESION;  Surgeon: Francis Gaines, MD;  Location: [facility];  Service: ENT;  Laterality: N/A;       Family History:  Family History   Problem Relation Age of Onset    Hyperlipidemia Mother     Hypertension Mother     Hyperlipidemia Father     Hypertension Father        Social History:  Social History     Tobacco Use    Smoking status: Former Smoker     Packs/day: 0.50     Years: 3.00     Pack years: 1.50     Quit date: 05/10/1999     Years since quitting: 20.0    Smokeless tobacco: Never Used   Substance Use Topics    Alcohol use: Yes  Alcohol/week: 0.0 - 2.0 standard drinks     Comment: USED TO DO 6 BEE A WK DECREASED Jun 24 2017    Drug use: No         The following sections were reviewed this encounter by the provider:   Allergies   Meds   Problems   Med Hx   Surg Hx   Fam Hx            Review of Systems   General/Constitutional:   Denies Change in appetite. Denies Chills. Denies Fatigue. Denies Fever. Denies Weight gain. Denies Weight loss.   Ophthalmologic:   Denies Blurred vision. Denies Diminished visual acuity. Denies Eye Pain.   ENT:   Denies Hearing Loss. Denies Nasal Discharge. Denies Hoarseness. Denies Ear pain. Denies Nosebleed. Denies Sinus pain. Denies Sore throat. Denies Sneezing.   Endocrine:   Denies Polydipsia. Denies Polyuria.   Cardiovascular:   Denies Chest pain. Denies Chest pain with exertion. Denies Leg Claudication. Denies Palpitations. Denies Swelling in hands/feet.   Respiratory:   Denies Paroxysmal Nocturnal  Dyspnea. Denies Cough. Denies Orthopnea. Denies Shortness of breath. Denies Daytime Hypersomnolence. Denies Snoring. Denies Witness Apnea. Denies Wheezing.   Gastrointestinal:   Denies Abdominal pain. Denies Blood in stool. Denies Constipation. Denies Diarrhea. Denies Heartburn. Denies Nausea. Denies Vomiting.   Hematology:   Denies Easy bruising. Denies Easy Bleeding.   Genitourinary:   Denies Blood in urine. Denies Nocturia.   Musculoskeletal:   Denies Joint pain. Denies Leg cramps. Denies Weakness in LE. Denies Swollen joints.   Peripheral Vascular:   Denies Cold extremities. Denies Decreased sensation in extremities. Denies Painful extremities.   Skin:   Denies Itching. Denies Change in Mole(s). Denies Rash.   Neurologic:   Denies Balance difficulty. Denies Dizziness. Denies Headache. Denies Pre-Syncope. Denies Memory loss.   Psychiatric:   Denies Anxiety. Denies Depressed mood. Denies Difficulty sleeping. Denies Mood Swings.    Objective:   Vitals:  BP (!) 132/91 (BP Site: Left arm, Patient Position: Sitting, Cuff Size: Medium)    Pulse 75    Temp 98.1 F (36.7 C) (Temporal)    Wt 70.1 kg (154 lb 9.6 oz)    SpO2 99%    BMI 24.21 kg/m     Physical Exam   GENERAL APPEARANCE: alert, in no acute distress, well developed, well nourished, oriented to time, place, and person.   HEAD: normal appearance, atraumatic.   EYES: extraocular movement intact (EOMI), pupils equal, round, reactive to light and accommodation, sclera anicteric, conjunctiva clear.   EARS: tympanic membranes normal bilaterally, external canals normal .   NOSE: normal nasal mucosa, no lesions.   ORAL CAVITY: normal oropharynx, normal lips, mucosa moist,   THROAT: normal appearance, clear, no erythema.   NECK/THYROID: neck supple, no carotid bruit,  no cervical lymphadenopathy, no neck mass palpated, no jugular venous distention, no thyromegaly.   SKIN: good turgor, no rashes, no suspicious lesions.   HEART: S1, S2 normal, no murmurs, rubs, gallops,  regular rate and rhythm.   LUNGS: normal effort / no distress, normal breath sounds, clear to auscultation bilaterally, no wheezes, rales, rhonchi.   ABDOMEN: bowel sounds present, no hepatosplenomegaly, soft, nontender, nondistended.   MUSCULOSKELETAL: full range of motion, no swelling or deformity.   EXTREMITIES: no edema, no clubbing, cyanosis, or edema.   PERIPHERAL PULSES: 2+ dorsalis pedis, 2+ posterior tibial.   NEUROLOGIC: nonfocal, cranial nerves 2-12 grossly intact, deep tendon reflexes 2+ symmetrical, normal strength, tone and reflexes, sensory exam intact.  PSYCH: cognitive function intact, mood/affect full range, speech clear.    Assessment/Plan:       1. Physical exam  - CBC and differential  - Comprehensive metabolic panel  - TSH  - Lipid panel  - Urinalysis  - Hemoglobin A1C    2. Attention deficit hyperactivity disorder (ADHD), unspecified ADHD type  - Urine Drug Abuse Panel With Conf  - Concerta 18 MG CR tablet; Take 1 tablet (18 mg total) by mouth every morning  Dispense: 30 tablet; Refill: 0  Patient is using it in as needed basis to perform at his work.  He reports he has been using since childhood and is not interested to go to psychiatrist for any new testing.  I understand this is a controlled substance & he signed the contract.  He will closely follow-up with me.    3. Need for viral immunization  - Flu vacc QUAD PF 6 MOS & UP (FLULAVAL/FlUARIX)    4. Alcohol use, unspecified with unspecified alcohol-induced disorder  He is overall cutting down his alcohol use to minimal and days of week does not have any signs and symptoms of dependence    5. Elevated blood pressure reading  He has a blood pressure machine at home and will check and notify me through my chart  Recommended low-salt diet for the time being    Health Maintenance:  2019 lab results reviewed with pt.  Immunizations UTD. Vision screening UTD. Dental Screening UTD. Blood pressure is elevated.  Recommend obtaining ambulatory blood  pressures 3-4x/week and communicate BP values via MyChart correspondence in 2-3 months.     Immunization History   Administered Date(s) Administered    Hepatitis B (Pediatric/Adolescent) 12/24/1993, 02/02/1994, 07/08/1994    Influenza (Im) Preserved TRIVALENT VACCINE 04/13/2013    Influenza quadrivalent (IM) PF 3 Yrs & greater 05/27/2017    Td 05/12/2001    Tdap 09/01/2012, 12/28/2015     .   Return in about 3 months (around 08/21/2019) for htn recheck.       I provided following Counseling/Anticipatory Guidance:    1. Importance of proper nutrition and maintaining healthy weight.       Increase vegetables, fruits, lentils, beans and fiber in your diet.       BMI goal; <30.   Blood Pressure goal: <140/90  2. Importance of aerobic/strengthening  Exercises and prevention of injuries.      Exercise at least 30 minutes 5 days per week.     Always wear your seatbelt in the car.     Wear sunscreen that is broad-spectrum (UVA/UVB) and at least SPF 30.  3. Importance of avoiding misuse of Tobacco, Alcohol, and mood altering drugs.       Avoid heavy drinking ( For Men < 14, and for Male < 7 drinks per week ).  4. Importance of proper dental and mental health.      Schedule dental exam and cleaning every 6 months      Have your vision checked every 1-2 years.  5. Importance of proper Immunization as recommended by ACIP.      Get a flu vaccine yearly in the fall.      Td or Tdap vaccine every 10 years.   6. Importance of proper Screening tests as recommended by USPSTF.      Colonoscopy test:  at least every 10 years starting at age 28.   7. Recommended screening Labs are ordered.    8. Suggest : Annual preventative visit.  This is different and separate from any problem-related visit.   9. Suggest  Website for medical information : [email or web address]   [facility] website :   [email or web address]        Len Childs, MD

## 2019-05-22 ENCOUNTER — Encounter (INDEPENDENT_AMBULATORY_CARE_PROVIDER_SITE_OTHER): Payer: Self-pay

## 2019-05-22 ENCOUNTER — Encounter (INDEPENDENT_AMBULATORY_CARE_PROVIDER_SITE_OTHER): Payer: Self-pay | Admitting: Internal Medicine

## 2019-05-22 LAB — URINE DRUG ABUSE PANEL WITH CONF
Urine Alcohol: NEGATIVE mg/dL
Urine Amphetamines: NEGATIVE ng/mL
Urine Barbiturates: NEGATIVE ng/mL
Urine Benzodiazepines: NEGATIVE ng/mL
Urine Cocaine: NEGATIVE ng/mL
Urine Methadone Metabolite: NEGATIVE ng/mL
Urine Opiates: NEGATIVE ng/mL
Urine Phencyclidine: NEGATIVE ng/mL
Urine Tetrahydrocannabinol: NEGATIVE ng/mL

## 2019-06-06 ENCOUNTER — Encounter (INDEPENDENT_AMBULATORY_CARE_PROVIDER_SITE_OTHER): Payer: Self-pay

## 2019-06-26 ENCOUNTER — Other Ambulatory Visit (INDEPENDENT_AMBULATORY_CARE_PROVIDER_SITE_OTHER): Payer: Self-pay | Admitting: Internal Medicine

## 2019-06-26 DIAGNOSIS — F909 Attention-deficit hyperactivity disorder, unspecified type: Secondary | ICD-10-CM

## 2019-06-27 MED ORDER — CONCERTA 18 MG PO TBCR
18.00 mg | EXTENDED_RELEASE_TABLET | Freq: Every morning | ORAL | 0 refills | Status: DC
Start: 2019-06-27 — End: 2019-08-02

## 2019-07-07 ENCOUNTER — Encounter (INDEPENDENT_AMBULATORY_CARE_PROVIDER_SITE_OTHER): Payer: Self-pay

## 2019-07-11 ENCOUNTER — Other Ambulatory Visit (INDEPENDENT_AMBULATORY_CARE_PROVIDER_SITE_OTHER): Payer: Self-pay | Admitting: Family Medicine

## 2019-07-11 DIAGNOSIS — G43909 Migraine, unspecified, not intractable, without status migrainosus: Secondary | ICD-10-CM

## 2019-07-11 MED ORDER — SUMATRIPTAN SUCCINATE 100 MG PO TABS
100.0000 mg | ORAL_TABLET | Freq: Every day | ORAL | 4 refills | Status: DC | PRN
Start: 2019-07-11 — End: 2019-09-19

## 2019-08-02 ENCOUNTER — Other Ambulatory Visit (INDEPENDENT_AMBULATORY_CARE_PROVIDER_SITE_OTHER): Payer: Self-pay | Admitting: Internal Medicine

## 2019-08-02 DIAGNOSIS — F909 Attention-deficit hyperactivity disorder, unspecified type: Secondary | ICD-10-CM

## 2019-08-02 MED ORDER — CONCERTA 18 MG PO TBCR
18.00 mg | EXTENDED_RELEASE_TABLET | Freq: Every morning | ORAL | 0 refills | Status: DC
Start: 2019-08-02 — End: 2019-08-05

## 2019-08-03 ENCOUNTER — Encounter (INDEPENDENT_AMBULATORY_CARE_PROVIDER_SITE_OTHER): Payer: Self-pay | Admitting: Family Medicine

## 2019-08-03 ENCOUNTER — Telehealth (INDEPENDENT_AMBULATORY_CARE_PROVIDER_SITE_OTHER): Payer: Self-pay

## 2019-08-03 NOTE — Telephone Encounter (Signed)
Started a PA for concerta 18 mg ER tab. Royston Sinner KeyEdd Arbour - PA Case ID: [id number] - Rx #: [id number] awaiting for insurance answer.

## 2019-08-05 ENCOUNTER — Other Ambulatory Visit (INDEPENDENT_AMBULATORY_CARE_PROVIDER_SITE_OTHER): Payer: Self-pay | Admitting: Internal Medicine

## 2019-08-05 DIAGNOSIS — F909 Attention-deficit hyperactivity disorder, unspecified type: Secondary | ICD-10-CM

## 2019-08-06 MED ORDER — CONCERTA 18 MG PO TBCR
18.00 mg | EXTENDED_RELEASE_TABLET | Freq: Every morning | ORAL | 0 refills | Status: DC
Start: 2019-08-06 — End: 2019-09-07

## 2019-08-21 ENCOUNTER — Encounter (INDEPENDENT_AMBULATORY_CARE_PROVIDER_SITE_OTHER): Payer: Self-pay | Admitting: Internal Medicine

## 2019-08-21 ENCOUNTER — Ambulatory Visit (INDEPENDENT_AMBULATORY_CARE_PROVIDER_SITE_OTHER): Payer: No Typology Code available for payment source | Admitting: Internal Medicine

## 2019-08-21 VITALS — BP 133/76 | HR 89 | Temp 97.4°F | Ht 67.0 in | Wt 154.0 lb

## 2019-08-21 DIAGNOSIS — F909 Attention-deficit hyperactivity disorder, unspecified type: Secondary | ICD-10-CM

## 2019-08-21 DIAGNOSIS — R030 Elevated blood-pressure reading, without diagnosis of hypertension: Secondary | ICD-10-CM

## 2019-08-21 NOTE — Progress Notes (Signed)
Have you seen any specialists/other providers since your last visit with us?    No    Arm preference verified?   Yes    The patient is due for MD will assess

## 2019-08-21 NOTE — Progress Notes (Signed)
Subjective:      Patient ID: Christopher Trevino is a 44 y.o. male     Chief Complaint   Patient presents with    ADHD        He is overall doing very well.  His symptoms are well controlled on Concerta.  He keeps getting intermittent migraine but mostly is related to his alcohol intake.  He does not have any new health concerns       The following sections were reviewed this encounter by the provider:   Allergies   Meds   Problems   Med Hx   Surg Hx   Fam Hx          Review of Systems   Constitutional: Negative for chills, fatigue and fever.   HENT: Negative for rhinorrhea and sore throat.    Respiratory: Negative for cough and shortness of breath.    Cardiovascular: Negative for chest pain, palpitations and leg swelling.   Gastrointestinal: Negative for abdominal pain, nausea and vomiting.   Genitourinary: Negative for dysuria and hematuria.   Skin: Negative for rash.   Neurological: Negative for light-headedness, numbness and headaches.   Hematological: Negative for adenopathy. Does not bruise/bleed easily.        BP 133/76 (BP Site: Left arm, Patient Position: Sitting, Cuff Size: Large)    Pulse 89    Temp 97.4 F (36.3 C) (Temporal)    Ht 1.702 m (5\' 7" )    Wt 69.9 kg (154 lb)    SpO2 99%    BMI 24.12 kg/m     Objective:     Physical Exam  General: awake, alert X3, No acute distress  Cardiovascular: regular rate and rhythm, no murmurs, rubs or gallops  Lungs: clear to auscultation bilaterally, without wheezing, rhonchi, or rales  Abdomen: soft, non-tender, non-distended; no palpable masses,  normoactive bowel sounds  Extremities: no edema, No cyanosis or clubbing  Neuro Grossly non-focal       Assessment/Plan:     1. Elevated blood pressure reading  Today his blood pressure is normal in the clinic  Continue salt limitation continue regular exercise as he is doing    2. Attention deficit hyperactivity disorder (ADHD), unspecified ADHD type  Doing well and stable on medication use.  Patient signed controlled  substance contract form  Patient is reliable with the use of stimulant  His U tox 3 months ago was negative  Next refill will give him for 42-month supply.  His PMP was reviewed and seems appropriate use of stimulants.      Patient encouraged to contact me/clinical staff with any questions/concerns.       Return in about 3 months (around 11/18/2019).    Len Childs, MD

## 2019-09-07 ENCOUNTER — Other Ambulatory Visit (INDEPENDENT_AMBULATORY_CARE_PROVIDER_SITE_OTHER): Payer: Self-pay | Admitting: Family Medicine

## 2019-09-07 ENCOUNTER — Encounter (INDEPENDENT_AMBULATORY_CARE_PROVIDER_SITE_OTHER): Payer: Self-pay

## 2019-09-07 DIAGNOSIS — F909 Attention-deficit hyperactivity disorder, unspecified type: Secondary | ICD-10-CM

## 2019-09-07 MED ORDER — CONCERTA 18 MG PO TBCR
18.00 mg | EXTENDED_RELEASE_TABLET | Freq: Every morning | ORAL | 0 refills | Status: DC
Start: 2019-09-07 — End: 2019-12-25

## 2019-09-19 ENCOUNTER — Other Ambulatory Visit (INDEPENDENT_AMBULATORY_CARE_PROVIDER_SITE_OTHER): Payer: Self-pay | Admitting: Family Medicine

## 2019-09-19 DIAGNOSIS — G43909 Migraine, unspecified, not intractable, without status migrainosus: Secondary | ICD-10-CM

## 2019-09-21 MED ORDER — SUMATRIPTAN SUCCINATE 100 MG PO TABS
100.0000 mg | ORAL_TABLET | Freq: Every day | ORAL | 4 refills | Status: DC | PRN
Start: 2019-09-21 — End: 2019-12-20

## 2019-09-28 ENCOUNTER — Other Ambulatory Visit (INDEPENDENT_AMBULATORY_CARE_PROVIDER_SITE_OTHER): Payer: Self-pay | Admitting: Internal Medicine

## 2019-10-16 ENCOUNTER — Other Ambulatory Visit (INDEPENDENT_AMBULATORY_CARE_PROVIDER_SITE_OTHER): Payer: Self-pay | Admitting: Internal Medicine

## 2019-10-25 ENCOUNTER — Emergency Department (HOSPITAL_BASED_OUTPATIENT_CLINIC_OR_DEPARTMENT_OTHER)
Admission: EM | Admit: 2019-10-25 | Discharge: 2019-10-25 | Disposition: A | Discharge status: Home / Self Care | Payer: Self-pay | Attending: Emergency Medicine | Admitting: Emergency Medicine

## 2019-10-25 ENCOUNTER — Emergency Department (HOSPITAL_BASED_OUTPATIENT_CLINIC_OR_DEPARTMENT_OTHER): Payer: Self-pay

## 2019-10-25 ENCOUNTER — Encounter (HOSPITAL_BASED_OUTPATIENT_CLINIC_OR_DEPARTMENT_OTHER): Payer: Self-pay | Admitting: *Deleted

## 2019-10-25 ENCOUNTER — Other Ambulatory Visit: Payer: Self-pay

## 2019-10-25 DIAGNOSIS — Y9389 Activity, other specified: Secondary | ICD-10-CM | POA: Insufficient documentation

## 2019-10-25 DIAGNOSIS — Y92838 Other recreation area as the place of occurrence of the external cause: Secondary | ICD-10-CM | POA: Insufficient documentation

## 2019-10-25 DIAGNOSIS — S52102A Unspecified fracture of upper end of left radius, initial encounter for closed fracture: Secondary | ICD-10-CM | POA: Insufficient documentation

## 2019-10-25 DIAGNOSIS — S92355A Nondisplaced fracture of fifth metatarsal bone, left foot, initial encounter for closed fracture: Secondary | ICD-10-CM | POA: Insufficient documentation

## 2019-10-25 DIAGNOSIS — M545 Low back pain, unspecified: Secondary | ICD-10-CM

## 2019-10-25 DIAGNOSIS — F17200 Nicotine dependence, unspecified, uncomplicated: Secondary | ICD-10-CM | POA: Insufficient documentation

## 2019-10-25 DIAGNOSIS — Y998 Other external cause status: Secondary | ICD-10-CM | POA: Insufficient documentation

## 2019-10-25 DIAGNOSIS — W19XXXA Unspecified fall, initial encounter: Secondary | ICD-10-CM

## 2019-10-25 MED ORDER — METHOCARBAMOL 500 MG PO TABS
500.0000 mg | ORAL_TABLET | Freq: Two times a day (BID) | ORAL | 0 refills | Status: AC
Start: 2019-10-25 — End: ?

## 2019-10-25 MED ORDER — NAPROXEN 250 MG PO TABS
500.0000 mg | ORAL_TABLET | Freq: Once | ORAL | Status: AC
  Administered 2019-10-25: 14:00:00 500 mg via ORAL
  Filled 2019-10-25: qty 2

## 2019-10-25 MED ORDER — HYDROCODONE-ACETAMINOPHEN 5-325 MG PO TABS: 1.0000 | ORAL_TABLET | Freq: Four times a day (QID) | ORAL | 0 refills | Status: AC | PRN

## 2019-10-25 MED ORDER — METHOCARBAMOL 500 MG PO TABS
500.0000 mg | ORAL_TABLET | Freq: Once | ORAL | Status: AC
  Administered 2019-10-25: 500 mg via ORAL
  Filled 2019-10-25: qty 1

## 2019-10-25 MED ORDER — HYDROCODONE-ACETAMINOPHEN 5-325 MG PO TABS
1.0000 | ORAL_TABLET | Freq: Once | ORAL | Status: AC
  Administered 2019-10-25: 1 via ORAL
  Filled 2019-10-25: qty 1

## 2019-10-25 MED ORDER — NAPROXEN 500 MG PO TABS: 500.0000 mg | ORAL_TABLET | Freq: Two times a day (BID) | ORAL | 0 refills | Status: AC

## 2019-10-25 MED FILL — NAPROXEN 500 MG TABS: 500 | 15 days supply | Qty: 30 | Fill #0

## 2019-10-25 MED FILL — HYDROCODON-APAP 5-325: 5-325 | 3 days supply | Qty: 10 | Fill #0

## 2019-10-25 MED FILL — METHOCARBAMOL 500 MG TABS: 500 | 10 days supply | Qty: 20 | Fill #0

## 2019-10-25 NOTE — ED Notes (Signed)
ED Provider at bedside. 

## 2019-10-25 NOTE — ED Notes (Addendum)
ED Provider just spoke with the Pt. About x ray results.

## 2019-10-25 NOTE — Discharge Instructions (Addendum)
Take naproxen twice daily, you can use Norco as needed for breakthrough pain, this is a narcotic pain medication and can cause drowsiness, do not take before driving.  You have also been prescribed Robaxin, this is a muscle relaxer to help with muscle spasm in your back.  This can also cause drowsiness, do not take before driving.  Please call to schedule a follow-up appointment with Dr. Magnus Ivan with orthopedics regarding your injuries.  You will need to remain in sling and boot until you are seen by him.  You can bear weight on your left foot as tolerated.

## 2019-10-25 NOTE — ED Provider Notes (Signed)
[facility] EMERGENCY DEPARTMENT Provider Note   CSN: [id number] Arrival date & time: 10/25/19  1308     History Chief Complaint  Patient presents with  . Fall    Jacob Ibarra is a 44 y.o. male.  Jacob Ibarra is a 44 y.o. male who is otherwise healthy, presents to the emergency department for evaluation after he fell off of a hover board last night.  Patient states that he decided to try his daughter's hover board and lost his balance and fell landing on his left side.  He reports that he caught his fall primarily with his left elbow, and also hit his left foot on the ground.  He reports 2 weeks prior while in [facility] he had tripped and fallen landing on the same left elbow had had swelling pain and decreased range of motion since then that was finally starting to improve and then he reinjured it last night.  He reports some worsening swelling, and that he cannot completely straighten the elbow due to pain.  He denies any pain at the wrist or shoulder.  He reports pain over the lateral aspect of the left foot, slight swelling but no bruising noted.  Reports he has not been able to bear weight on the foot.  No pain at the ankle or knee.  He reports that he has a history of some problems with his back and he thinks this fall exacerbated his back pain.  He has had some pain in his left lower back that is worse with movement.  It does not radiate into the leg.  No numbness weakness or tingling.  No loss of bowel or bladder control.  He did not hit his head during the fall.  He has not taken anything for his symptoms prior to arrival.  No other aggravating or alleviating factors.        History reviewed. No pertinent past medical history.  There are no problems to display for this patient.   History reviewed. No pertinent surgical history.     No family history on file.  Social History   Tobacco Use  . Smoking status: Current Every Day Smoker  . Smokeless tobacco: Never  Used  Substance Use Topics  . Alcohol use: Not Currently  . Drug use: Never    Home Medications Prior to Admission medications   Medication Sig Start Date End Date Taking? Authorizing Provider  HYDROcodone-acetaminophen (NORCO/VICODIN) 5-325 MG tablet Take 1 tablet by mouth every 6 (six) hours as needed. 10/25/19   Dartha Lodge, PA-C  methocarbamol (ROBAXIN) 500 MG tablet Take 1 tablet (500 mg total) by mouth 2 (two) times daily. 10/25/19   Dartha Lodge, PA-C  naproxen (NAPROSYN) 500 MG tablet Take 1 tablet (500 mg total) by mouth 2 (two) times daily. 10/25/19   Dartha Lodge, PA-C    Allergies    Patient has no known allergies.  Review of Systems   Review of Systems  Constitutional: Negative for chills and fever.  Musculoskeletal: Positive for arthralgias and joint swelling. Negative for back pain and neck pain.  Skin: Negative for color change and wound.  Neurological: Negative for weakness, numbness and headaches.  All other systems reviewed and are negative.   Physical Exam Updated Vital Signs BP (!) 155/93   Pulse 87   Temp 98.2 F (36.8 C) (Oral)   Resp 20   Ht 5\' 10"  (1.778 m)   Wt 93 kg   SpO2 100%  BMI 29.41 kg/m   Physical Exam Vitals and nursing note reviewed.  Constitutional:      General: He is not in acute distress.    Appearance: Normal appearance. He is well-developed and normal weight. He is not ill-appearing or diaphoretic.  HENT:     Head: Normocephalic and atraumatic.     Comments: No evidence of head trauma Eyes:     General:        Right eye: No discharge.        Left eye: No discharge.  Neck:     Comments: No midline C-spine tenderness. Pulmonary:     Effort: Pulmonary effort is normal. No respiratory distress.  Musculoskeletal:     Comments: No midline thoracic or lumbar spine tenderness there is tenderness over the left lower back musculature with some palpable spasm. Tenderness to palpation over the left elbow with slight swelling  noted, patient is not able to completely straighten the elbow due to pain.  No pain or limited range of motion at the wrist or shoulder.  2+ distal pulses, normal strength and sensation. There is also tenderness over the fifth metatarsal of the left foot without bruising, slight swelling noted, no tenderness over the lateral malleolus and normal range of motion at the ankle.  Patient is able to wiggle toes and has 2+ DP and TP pulses and good cap refill. All compartments soft.  Skin:    General: Skin is warm and dry.  Neurological:     Mental Status: He is alert and oriented to person, place, and time.     Coordination: Coordination normal.  Psychiatric:        Mood and Affect: Mood normal.        Behavior: Behavior normal.     ED Results / Procedures / Treatments   Labs (all labs ordered are listed, but only abnormal results are displayed) Labs Reviewed - No data to display  EKG None  Radiology DG Elbow Complete Left  Result Date: 10/25/2019 CLINICAL DATA:  Left elbow pain after fall EXAM: LEFT ELBOW - COMPLETE 3+ VIEW COMPARISON:  None. FINDINGS: Acute nondisplaced intra-articular fracture of the left radial head and neck. Subtle cortical irregularity of the posterior tip of the olecranon may represent an additional nondisplaced fracture. Bony alignment is maintained without dislocation. There is a moderate-sized elbow joint hemarthrosis. Soft tissue swelling and edema noted posteriorly. IMPRESSION: 1. Acute nondisplaced intra-articular fracture of the left radial head and neck. 2. Subtle cortical irregularity of the posterior tip of the olecranon may represent an additional nondisplaced fracture. 3. Moderate elbow joint hemarthrosis. Electronically Signed   By: Davina Poke D.O.   On: 10/25/2019 14:56   DG Foot Complete Left  Result Date: 10/25/2019 CLINICAL DATA:  [place] last night. LATERAL foot pain. EXAM: LEFT FOOT - COMPLETE 3+ VIEW COMPARISON:  None. FINDINGS: There is an acute  fracture involving the base of the 5th metatarsal and associated with soft tissue swelling. Fracture is minimally displaced. No radiopaque foreign body or soft tissue gas. IMPRESSION: Fracture of the base of the 5th metatarsal. Electronically Signed   By: Nolon Nations M.D.   On: 10/25/2019 14:54    Procedures Procedures (including critical care time)  Medications Ordered in ED Medications  naproxen (NAPROSYN) tablet 500 mg (500 mg Oral Given 10/25/19 1414)  methocarbamol (ROBAXIN) tablet 500 mg (500 mg Oral Given 10/25/19 1414)  HYDROcodone-acetaminophen (NORCO/VICODIN) 5-325 MG per tablet 1 tablet (1 tablet Oral Given 10/25/19 1604)  ED Course  I have reviewed the triage vital signs and the nursing notes.  Pertinent labs & imaging results that were available during my care of the patient were reviewed by me and considered in my medical decision making (see chart for details).    MDM Rules/Calculators/A&P                     44 year old male presents after he fell from a hover board, he had fallen 2 weeks prior and injured his left elbow and reinjured this last night with worsening swelling and decreased range of motion also complaining of pain over the fifth metatarsal and pain in the left low back.  He has no midline spinal tenderness, did not have any head injury during the fall.  Will obtain x-rays of the left elbow and left foot.  Pain medication given.  I have personally ordered, reviewed and interpreted all imaging. X-rays of the left foot show a fracture at the base of the fifth metatarsal, there is minimal if any displacement. Left elbow films show acute nondisplaced intra-articular fracture of the left radial head and neck with a subtle cortical irregularity of the posterior tip of the olecranon that may represent an additional nondisplaced fracture.  Moderate effusion noted.  I discussed patient's injuries with Dr. Magnus Ivan with orthopedics who recommends sling for radial  head fracture, and postop shoe or cam walker boot depending on patient comfort for metatarsal fracture.  Weightbearing as tolerated.  He will see the patient next week and follow-up in the office.  Patient placed in sling and Cam walker boot for more comfort with weightbearing as patient will not be able to use crutches due to elbow injury.  He does have a cane and has been able to ambulate stably in the department.  Pain improved with treatment here in the ED will discharge home with pain medication encouraged to use ice and elevation.  He will call to schedule follow-up appointment.  Return precautions discussed.  Discharged home in good condition.  Final Clinical Impression(s) / ED Diagnoses Final diagnoses:  Fall, initial encounter  Closed nondisplaced fracture of fifth metatarsal bone of left foot, initial encounter  Closed fracture of proximal end of left radius, unspecified fracture morphology, initial encounter  Acute left-sided low back pain without sciatica    Rx / DC Orders ED Discharge Orders         Ordered    naproxen (NAPROSYN) 500 MG tablet  2 times daily     10/25/19 1702    HYDROcodone-acetaminophen (NORCO/VICODIN) 5-325 MG tablet  Every 6 hours PRN     10/25/19 1702    methocarbamol (ROBAXIN) 500 MG tablet  2 times daily     10/25/19 1702           Dartha Lodge, [place] 10/25/19 1746    Terald Sleeper, MD 10/25/19 1753

## 2019-10-25 NOTE — ED Triage Notes (Signed)
He fell off a hover board last night.  Left foot, left elbow and back pain.

## 2019-11-14 ENCOUNTER — Encounter (INDEPENDENT_AMBULATORY_CARE_PROVIDER_SITE_OTHER): Payer: Self-pay | Admitting: Internal Medicine

## 2019-11-15 ENCOUNTER — Encounter (INDEPENDENT_AMBULATORY_CARE_PROVIDER_SITE_OTHER): Payer: Self-pay | Admitting: Internal Medicine

## 2019-12-20 ENCOUNTER — Other Ambulatory Visit (INDEPENDENT_AMBULATORY_CARE_PROVIDER_SITE_OTHER): Payer: Self-pay | Admitting: Internal Medicine

## 2019-12-20 DIAGNOSIS — G43909 Migraine, unspecified, not intractable, without status migrainosus: Secondary | ICD-10-CM

## 2019-12-20 MED ORDER — SUMATRIPTAN SUCCINATE 100 MG PO TABS
100.0000 mg | ORAL_TABLET | Freq: Every day | ORAL | 4 refills | Status: DC | PRN
Start: 2019-12-20 — End: 2019-12-21

## 2019-12-21 ENCOUNTER — Other Ambulatory Visit (INDEPENDENT_AMBULATORY_CARE_PROVIDER_SITE_OTHER): Payer: Self-pay | Admitting: Internal Medicine

## 2019-12-21 ENCOUNTER — Encounter (INDEPENDENT_AMBULATORY_CARE_PROVIDER_SITE_OTHER): Payer: Self-pay | Admitting: Internal Medicine

## 2019-12-21 DIAGNOSIS — G43909 Migraine, unspecified, not intractable, without status migrainosus: Secondary | ICD-10-CM

## 2019-12-21 MED ORDER — SUMATRIPTAN SUCCINATE 100 MG PO TABS
100.0000 mg | ORAL_TABLET | Freq: Every day | ORAL | 0 refills | Status: DC | PRN
Start: 2019-12-21 — End: 2019-12-25

## 2019-12-24 NOTE — Progress Notes (Signed)
Have you seen any specialists/other providers since your last visit with [place]?    No    Arm preference verified?   Yes    The patient is due for MD will assess         .Tick bite  Rash or flu like symptoms: fever, chills, sore throat, headache 2 or 4 weeks after tick bite: yes   Bull eye rash develops around tick bite: yes

## 2019-12-25 ENCOUNTER — Ambulatory Visit (INDEPENDENT_AMBULATORY_CARE_PROVIDER_SITE_OTHER): Payer: No Typology Code available for payment source | Admitting: Internal Medicine

## 2019-12-25 ENCOUNTER — Encounter (INDEPENDENT_AMBULATORY_CARE_PROVIDER_SITE_OTHER): Payer: Self-pay | Admitting: Internal Medicine

## 2019-12-25 VITALS — BP 123/88 | HR 70 | Temp 97.8°F | Ht 67.0 in | Wt 150.8 lb

## 2019-12-25 DIAGNOSIS — A6920 Lyme disease, unspecified: Secondary | ICD-10-CM

## 2019-12-25 DIAGNOSIS — G43909 Migraine, unspecified, not intractable, without status migrainosus: Secondary | ICD-10-CM

## 2019-12-25 MED ORDER — DOXYCYCLINE HYCLATE 100 MG PO CAPS
100.00 mg | ORAL_CAPSULE | Freq: Two times a day (BID) | ORAL | 0 refills | Status: DC
Start: 2019-12-25 — End: 2020-01-02

## 2019-12-25 MED ORDER — SUMATRIPTAN SUCCINATE 100 MG PO TABS
100.0000 mg | ORAL_TABLET | Freq: Every day | ORAL | 1 refills | Status: DC | PRN
Start: 2019-12-25 — End: 2020-01-08

## 2019-12-25 NOTE — Progress Notes (Signed)
Subjective:      Patient ID: Christopher Trevino is a 44 y.o. male     Chief Complaint   Patient presents with    Tick Bite     2 weeks ago bite- appeared 10 laters symptoms Bull eyes rash, fatigue, muscle aches, headache,         HPI   Patient was in [place] for beach vacation last week.  He started to feel sore throat some headaches since last Tuesday and on Friday his headache was worse which got overall better but he started to have chills with temperature of 100.2 over the weekend that prompted him to come back from the vacation.  He recalled 2 weeks ago he had a tick bite in his left groin and since last couple of days he has noticed ringlike rash around it and is concerned about Lyme disease.  As far as he remember there was a deer tick at his groin 2 weeks ago probably not engorged and not sure how long it was attached.  He had felt fatigued for the whole week .     The following sections were reviewed this encounter by the provider:   Allergies   Meds   Problems   Med Hx   Surg Hx   Fam Hx          Review of Systems   Constitutional: +ve for chills, fatigue and fever.   HENT: Negative for rhinorrhea and sore throat.    Respiratory: Negative for cough and shortness of breath.    Cardiovascular: Negative for chest pain, palpitations and leg swelling.   Gastrointestinal: Negative for abdominal pain, nausea and vomiting.   Genitourinary: Negative for dysuria and hematuria.   Skin: Negative for left groin rash around the bite mark with some itchy feeling  Neurological: Negative for light-headedness, numbness and headaches.   Hematological: Negative for adenopathy. Does not bruise/bleed easily.        BP 123/88 (BP Site: Left arm, Patient Position: Sitting, Cuff Size: Medium)    Pulse 70    Temp 97.8 F (36.6 C) (Temporal)    Ht 1.702 m (5\' 7" )    Wt 68.4 kg (150 lb 12.8 oz)    SpO2 99%    BMI 23.62 kg/m     Objective:     Physical Exam  General: awake, alert X3, No acute distress  No icterus, no pallor, No  thyromegaly no JVD  Cardiovascular: regular rate and rhythm, no murmurs, rubs or gallops  Lungs: clear to auscultation bilaterally, without wheezing, rhonchi, or rales  Abdomen: soft, non-tender, non-distended; no palpable masses,  normoactive bowel sounds  Around left groin tick bite site with central scabbing wound and surrounding ring of erythema   extremities: no edema, No cyanosis or clubbing  Neuro Grossly non-focal       Assessment/Plan:     1. Erythema migrans (Lyme disease)  - doxycycline (VIBRAMYCIN) 100 MG capsule; Take 1 capsule (100 mg total) by mouth 2 (two) times daily for 10 days  Dispense: 20 capsule; Refill: 0  He has been having fatigue along with intermittent headache low-grade temperature in last 1 week and had a history of tick bite 2 weeks ago.  This Saturday was having low-grade temperature  We will treat him as Lyme disease  Risk and benefits of antibiotic treatment discussed with the patient.  Patient understands there is risk of nausea vomiting, allergic reaction or C. difficile infection with any antibiotics use.  Patient is encouraged to call office if any new symptoms or side effects happens.  Already had been vaccinated for COVID-19     2. Migraine without status migrainosus, not intractable, unspecified migraine type  - SUMAtriptan (Imitrex) 100 MG tablet; Take 1 tablet (100 mg total) by mouth daily as needed for Migraine May repeat one time in 24 hours. Ca not take more than 100 mg/day;  Dispense: 30 tablet; Refill: 1  Lately he is getting more frequent episodes of headaches.  Other alternative and preventive medication may need to be considered  Once his acute illness is subsides he can follow-up next visit and we will discuss about options.  At this time continue Imitrex as needed    Risk & Benefits of the new medication were explained to the patient who verbalized understanding of potential side effects and agreed to the treatment plan. Patient encouraged to contact me/clinical  staff with any questions/concerns.     Call if you develop any new or worsening symptoms  Proceed to urgent care or ED after hours if necessary     Return if symptoms worsen or fail to improve.    Len Childs, MD

## 2020-01-01 ENCOUNTER — Encounter (INDEPENDENT_AMBULATORY_CARE_PROVIDER_SITE_OTHER): Payer: Self-pay | Admitting: Internal Medicine

## 2020-01-02 ENCOUNTER — Other Ambulatory Visit (INDEPENDENT_AMBULATORY_CARE_PROVIDER_SITE_OTHER): Payer: Self-pay | Admitting: Internal Medicine

## 2020-01-02 DIAGNOSIS — A6920 Lyme disease, unspecified: Secondary | ICD-10-CM

## 2020-01-02 MED ORDER — DOXYCYCLINE HYCLATE 100 MG PO CAPS
100.00 mg | ORAL_CAPSULE | Freq: Two times a day (BID) | ORAL | 0 refills | Status: AC
Start: 2020-01-02 — End: 2020-01-07

## 2020-01-08 ENCOUNTER — Other Ambulatory Visit (INDEPENDENT_AMBULATORY_CARE_PROVIDER_SITE_OTHER): Payer: Self-pay | Admitting: Internal Medicine

## 2020-01-08 DIAGNOSIS — G43909 Migraine, unspecified, not intractable, without status migrainosus: Secondary | ICD-10-CM

## 2020-01-08 MED ORDER — SUMATRIPTAN SUCCINATE 100 MG PO TABS
100.0000 mg | ORAL_TABLET | Freq: Every day | ORAL | 1 refills | Status: DC | PRN
Start: 2020-01-08 — End: 2020-05-07

## 2020-02-26 ENCOUNTER — Other Ambulatory Visit (INDEPENDENT_AMBULATORY_CARE_PROVIDER_SITE_OTHER): Payer: Self-pay | Admitting: Internal Medicine

## 2020-05-07 ENCOUNTER — Other Ambulatory Visit (INDEPENDENT_AMBULATORY_CARE_PROVIDER_SITE_OTHER): Payer: Self-pay | Admitting: Internal Medicine

## 2020-05-07 DIAGNOSIS — G43909 Migraine, unspecified, not intractable, without status migrainosus: Secondary | ICD-10-CM

## 2020-05-07 MED ORDER — SUMATRIPTAN SUCCINATE 100 MG PO TABS
100.0000 mg | ORAL_TABLET | Freq: Every day | ORAL | 1 refills | Status: DC | PRN
Start: 2020-05-07 — End: 2020-07-05

## 2020-07-05 ENCOUNTER — Other Ambulatory Visit (INDEPENDENT_AMBULATORY_CARE_PROVIDER_SITE_OTHER): Payer: Self-pay | Admitting: Internal Medicine

## 2020-07-05 DIAGNOSIS — G43909 Migraine, unspecified, not intractable, without status migrainosus: Secondary | ICD-10-CM

## 2020-07-07 MED ORDER — SUMATRIPTAN SUCCINATE 100 MG PO TABS
100.0000 mg | ORAL_TABLET | Freq: Every day | ORAL | 1 refills | Status: DC | PRN
Start: 2020-07-07 — End: 2020-09-13

## 2020-09-13 ENCOUNTER — Other Ambulatory Visit (INDEPENDENT_AMBULATORY_CARE_PROVIDER_SITE_OTHER): Payer: Self-pay | Admitting: Internal Medicine

## 2020-09-13 DIAGNOSIS — G43909 Migraine, unspecified, not intractable, without status migrainosus: Secondary | ICD-10-CM

## 2020-09-15 MED ORDER — SUMATRIPTAN SUCCINATE 100 MG PO TABS
100.0000 mg | ORAL_TABLET | Freq: Every day | ORAL | 1 refills | Status: DC | PRN
Start: 2020-09-15 — End: 2021-09-15

## 2020-09-29 ENCOUNTER — Encounter (INDEPENDENT_AMBULATORY_CARE_PROVIDER_SITE_OTHER): Payer: Self-pay | Admitting: Family Medicine

## 2020-09-29 ENCOUNTER — Ambulatory Visit (INDEPENDENT_AMBULATORY_CARE_PROVIDER_SITE_OTHER): Payer: BC Managed Care – PPO | Admitting: Family Medicine

## 2020-09-29 VITALS — BP 136/86 | HR 69 | Temp 98.3°F | Wt 150.1 lb

## 2020-09-29 DIAGNOSIS — G43809 Other migraine, not intractable, without status migrainosus: Secondary | ICD-10-CM

## 2020-09-29 DIAGNOSIS — Z0000 Encounter for general adult medical examination without abnormal findings: Secondary | ICD-10-CM

## 2020-09-29 DIAGNOSIS — D229 Melanocytic nevi, unspecified: Secondary | ICD-10-CM

## 2020-09-29 DIAGNOSIS — F909 Attention-deficit hyperactivity disorder, unspecified type: Secondary | ICD-10-CM

## 2020-09-29 LAB — URINALYSIS
Bilirubin, UA: NEGATIVE
Blood, UA: NEGATIVE
Glucose, UA: NEGATIVE
Ketones UA: 40 — AB
Leukocyte Esterase, UA: NEGATIVE
Nitrite, UA: NEGATIVE
Protein, UR: NEGATIVE
Specific Gravity UA: 1.013 (ref 1.001–1.035)
Urine pH: 6 (ref 5.0–8.0)
Urobilinogen, UA: 0.2 (ref 0.2–2.0)

## 2020-09-29 LAB — COMPREHENSIVE METABOLIC PANEL
ALT: 15 U/L (ref 0–55)
AST (SGOT): 16 U/L (ref 5–34)
Albumin/Globulin Ratio: 1.6 (ref 0.9–2.2)
Albumin: 4.5 g/dL (ref 3.5–5.0)
Alkaline Phosphatase: 76 U/L (ref 37–117)
Anion Gap: 8 (ref 5.0–15.0)
BUN: 15 mg/dL (ref 9.0–28.0)
Bilirubin, Total: 1 mg/dL (ref 0.2–1.2)
CO2: 28 mEq/L (ref 21–29)
Calcium: 9.7 mg/dL (ref 8.5–10.5)
Chloride: 103 mEq/L (ref 100–111)
Creatinine: 1 mg/dL (ref 0.5–1.5)
Globulin: 2.8 g/dL (ref 2.0–3.7)
Glucose: 76 mg/dL (ref 70–100)
Potassium: 3.9 mEq/L (ref 3.5–5.1)
Protein, Total: 7.3 g/dL (ref 6.0–8.3)
Sodium: 139 mEq/L (ref 136–145)

## 2020-09-29 LAB — CBC AND DIFFERENTIAL
Absolute NRBC: 0 10*3/uL (ref 0.00–0.00)
Basophils Absolute Automated: 0.05 10*3/uL (ref 0.00–0.08)
Basophils Automated: 0.8 %
Eosinophils Absolute Automated: 0.03 10*3/uL (ref 0.00–0.44)
Eosinophils Automated: 0.5 %
Hematocrit: 46.3 % (ref 37.6–49.6)
Hgb: 15.8 g/dL (ref 12.5–17.1)
Immature Granulocytes Absolute: 0.01 10*3/uL (ref 0.00–0.07)
Immature Granulocytes: 0.2 %
Lymphocytes Absolute Automated: 2.09 10*3/uL (ref 0.42–3.22)
Lymphocytes Automated: 33.8 %
MCH: 30.7 pg (ref 25.1–33.5)
MCHC: 34.1 g/dL (ref 31.5–35.8)
MCV: 90.1 fL (ref 78.0–96.0)
MPV: 11 fL (ref 8.9–12.5)
Monocytes Absolute Automated: 0.5 10*3/uL (ref 0.21–0.85)
Monocytes: 8.1 %
Neutrophils Absolute: 3.51 10*3/uL (ref 1.10–6.33)
Neutrophils: 56.6 %
Nucleated RBC: 0 /100 WBC (ref 0.0–0.0)
Platelets: 234 10*3/uL (ref 142–346)
RBC: 5.14 10*6/uL (ref 4.20–5.90)
RDW: 11 % (ref 11–15)
WBC: 6.19 10*3/uL (ref 3.10–9.50)

## 2020-09-29 LAB — LIPID PANEL
Cholesterol / HDL Ratio: 2.5
Cholesterol: 186 mg/dL (ref 0–199)
HDL: 73 mg/dL (ref 40–9999)
LDL Calculated: 102 mg/dL — ABNORMAL HIGH (ref 0–99)
Triglycerides: 53 mg/dL (ref 34–149)
VLDL Calculated: 11 mg/dL (ref 10–40)

## 2020-09-29 LAB — HEMOLYSIS INDEX: Hemolysis Index: 3 (ref 0–24)

## 2020-09-29 LAB — GFR: EGFR: >60

## 2020-09-29 LAB — HEMOGLOBIN A1C
Average Estimated Glucose: 96.8 mg/dL
Hemoglobin A1C: 5 % (ref 4.6–5.9)

## 2020-09-29 LAB — TSH: TSH: 0.5 u[IU]/mL (ref 0.35–4.94)

## 2020-09-29 NOTE — Progress Notes (Signed)
Subjective:      Patient ID: Christopher Trevino is a 45 y.o. male    Chief Complaint   Patient presents with    Annual Exam     Pt is currently fasting.         HPI    Christopher Trevino is a 45 y.o. male presenting for annual examination, fasting blood work.     Migraine headache, he reports frequent migraine headache flare.  He does take Imitrex at least twice weekly with great benefit.  He reports drinking alcohol, stress, exposure to heat triggers headache usually.  He has longstanding history of migraine headache.    H/o elevated BP, BP suboptimal range but he reports he does monitors BP at home intermittently, BP always runs at good range at home.  Denies any associated symptoms.  He would like to continue monitoring BP at home and if persistently blood pressure more than 140/90 he will follow-up with [place].  He has been trying low-salt diet and does exercise regular basis.    ADD, doing well without the stimulant medication.  He is off stimulant medication several months and no concern at this time.    Scattered moles, he has multiple scattered moles and would like to check it out.  He has had history of biopsy of the mole from chest wall which was benign.  No family history of melanoma or other kind of skin cancer.    Colonoscopy,Patient reports he underwent colonoscopy with concern of GI bleeding 3 years ago 2019.  Per patient colonoscopy was completely unremarkable and was told not to repeat colonoscopy in 10 years.  Denies any family history colon cancer or GI related cancer.    Immunization, patient has completed COVID vaccine with booster dose.  Per patient he is up-to-date on flu vaccine.  Tdap was given 12/2015.    He does work at [employer].  He is married.  He has 1 daughter 12 years old.    He does not smoke cigarettes.  He drinks alcohol socially only.  Denies any drug use.    Past Medical History:   Diagnosis Date    Attention deficit hyperactivity disorder (ADHD)     History of migraine      LAST MIGRANE Aug 17 2017  EVEN 1 BEER OR GLASS OF WINE BRINGS IT ON        The following portions of the patient's history were reviewed and updated as appropriate: current medications, allergies, past family history, past medical history, past social history, past surgical history and problem list.     Review of Systems   Review of Systems   Constitutional: Negative for activity change, appetite change, chills, fatigue, fever and unexpected weight change.   HENT: Negative for congestion, ear pain, hearing loss, postnasal drip, trouble swallowing and voice change.    Eyes: Negative for pain, redness and visual disturbance.   Respiratory: Negative for cough, chest tightness, shortness of breath and wheezing.    Cardiovascular: Negative for chest pain, palpitations and leg swelling.   Gastrointestinal: Negative for abdominal pain, anal bleeding, blood in stool, constipation, diarrhea, nausea and vomiting.   Endocrine: Negative for cold intolerance, heat intolerance, polydipsia, polyphagia and polyuria.   Genitourinary: Negative for discharge, dysuria, flank pain, frequency, genital sores, genital lesion and urgency.   Musculoskeletal: Negative for arthralgias, back pain, gait problem, joint swelling, myalgias and neck stiffness.   Skin: Positive for skin moles.  Negative for pallor, rash and wound.  Allergic/Immunologic: Negative for food allergies.   Neurological: Positive for migraine headache.  Negative for dizziness, syncope, weakness, light-headedness, numbness.  Hematological: Negative for adenopathy.   Psychiatric/Behavioral: Negative for behavioral problems, anxiety/nervous, decreased concentration, dysphoric mood, hallucinations, self-injury, sleep disturbance and suicidal ideas.          Patient Active Problem List   Diagnosis    Peritonsillar abscess    Hyperglycemia    History of migraine    Lower abdominal pain    Colitis    Rectal bleeding    Status post tonsillectomy    Attention deficit  hyperactivity disorder (ADHD), unspecified ADHD type    Migraine without status migrainosus, not intractable, unspecified migraine type    Alcohol use, unspecified with unspecified alcohol-induced disorder     Past Medical History:   Diagnosis Date    Attention deficit hyperactivity disorder (ADHD)     History of migraine     LAST MIGRANE Aug 17 2017  EVEN 1 BEER OR GLASS OF WINE BRINGS IT ON     Past Surgical History:   Procedure Laterality Date    INCISION & DRAINAGE, RETROPHARYNGEAL ABSCESS Left 07/01/2017    Procedure: INCISION & DRAINAGE, RETROPHARYNGEAL ABSCESS;  Surgeon: Francis Gaines, MD;  Location: [facility];  Service: ENT;  Laterality: Left;    NASAL SEPTUM SURGERY  AGE 22    TONSILLECTOMY N/A 08/31/2017    Procedure: TONSILLECTOMY AND EXCISION OF FRENULUM LESION;  Surgeon: Francis Gaines, MD;  Location:  MAIN OR;  Service: ENT;  Laterality: N/A;     Family History   Problem Relation Age of Onset    Hyperlipidemia Mother     Hypertension Mother     Hyperlipidemia Father     Hypertension Father      Social History     Socioeconomic History    Marital status: Married    Number of children: 1   Tobacco Use    Smoking status: Former Smoker     Packs/day: 0.50     Years: 3.00     Pack years: 1.50     Quit date: 05/10/1999     Years since quitting: 21.4    Smokeless tobacco: Never Used   Vaping Use    Vaping Use: Never used   Substance and Sexual Activity    Alcohol use: Yes     Alcohol/week: 0.0 - 2.0 standard drinks     Comment: USED TO DO 6 BEE A WK DECREASED Jun 24 2017    Drug use: No    Sexual activity: Yes     Partners: Female     Birth control/protection: Condom     Current Outpatient Medications   Medication Sig Dispense Refill    SUMAtriptan (Imitrex) 100 MG tablet Take 1 tablet (100 mg total) by mouth as needed in the morning for Migraine. May repeat one time in 24 hours. Ca not take more than 100 mg/day;. 30 tablet 1     No current facility-administered medications  for this visit.     No Known Allergies         Objective:     BP 136/86 (BP Site: Left arm, Patient Position: Sitting, Cuff Size: Medium)    Pulse 69    Temp 98.3 F (36.8 C) (Temporal)    Wt 68.1 kg (150 lb 1.6 oz)    SpO2 98%    BMI 23.51 kg/m      Physical Exam   Physical Exam   Constitutional: oriented to  person, place, and time. Patient appears well-developed and well-nourished. No distress.   HENT:   Head: Normocephalic and atraumatic.   Right Ear: External ear normal.   Left Ear: External ear normal.   Nose: Nose normal.   Mouth/Throat: Oropharynx is clear and moist. No oropharyngeal exudate.   Eyes: Pupils are equal, round, and reactive to light. Conjunctivae and EOM are normal. Right eye exhibits no discharge. Left eye exhibits no discharge. No scleral icterus.   Neck: Normal range of motion. Neck supple. No JVD present. No thyromegaly present.   Cardiovascular: Normal rate, regular rhythm, normal heart sounds and intact distal pulses.  Exam reveals no friction rub.    No murmur heard.  Pulmonary/Chest: Effort normal and breath sounds normal. No stridor. No respiratory distress. No wheezes. No rales.  No tenderness.   Breast: inspection- skin, nipple areola looks normal;  Axillary LN normal;  Abdominal: Soft. Bowel sounds are normal. He exhibits no distension. There is no tenderness. There is no rebound and no guarding.   Inguinal LN- normal;  Genitourinary:   Genitourinary Comments: Patient declined GU/DRE examination today.   Musculoskeletal: Normal range of motion. He exhibits no edema, tenderness or deformity.   Lymphadenopathy:     No cervical adenopathy. No axillary lymphadenopathy;  Neurological: alert and oriented to person, place, and time. Normal reflexes. Normal reflexes. No cranial nerve deficit. Normal muscle tone. Coordination normal.   Skin: Scattered dark-colored skin moles.  Skin is warm. No rash noted. Not diaphoretic. No erythema. No pallor.   Scattered benign looking nevi, freckles.   Limited skin checkup as patient did not take off pants.   Psych: Mood/affect normal; Behavior normal. Judgement and thought content normal;         Assessment/Plan:     45 y.o. male presents for physical and fasting blood work      Discussed the patient's BMI with him.  Body mass index is 23.51 kg/m.   HIgh BMI Follow Up   Encouragement to Exercise  Nutrition and Physical Activity Counseling:  Nutrition Counseling:   Food education, guidance and counseling  Physical Activity Counseling   Strength training exercise promotion    Counseling: Counseling/Anticipatory Guidance (40-64): Nutrition, physical activity, healthy weight, injury prevention, misuse of tobacco, alcohol and drugs, sexual behavior and STDs, contraception, dental health, mental health, immunizations, screenings.       Health maintenance:  -Fasting blood work: today, reviewed planned labs and pt agrees to them.   -Immunizations:  patient has completed COVID vaccine with booster dose.  Per patient he is up-to-date on flu vaccine.  Tdap was given 12/2015.  -STD screen: Patient did not request for STD screening.  Denies any headache sexual behavior.  Denies any associated symptoms.  -Colonoscopy: Patient reports he underwent colonoscopy with concern of GI bleeding 3 years ago 2019.  Per patient colonoscopy was completely unremarkable and was told not to repeat colonoscopy in 10 years.  Denies any family history colon cancer or GI related cancer.  -Prostate:Denies any associated symptom of BPH.  -Vision: Within 2 year  -Dental: Within 2 year    Migraine headache, frequent flare (at least twice weekly), Imitrex helping as needed  -Referred to neurologist for further evaluation and for preventative treatment option.  Patient agreeable to schedule appointment soon.    History of elevated blood pressure, BP suboptimal but patient reports BP always runs in good range at home.   -Advised to continue monitoring BP at home.  If persistently elevated blood  pressure more than 140/90 is to call back   -Reviewed low-salt diet and exercise  -Reviewed signs symptom of elevated blood pressure that necessitate urgent/ED evaluation.  Patient verbally understands.  -Lab today.      ADD, doing well without the stimulant medication.  He is off stimulant medication several months and no concern at this time.    Scattered moles, he has multiple scattered moles   -No personal or family history of skin cancer/melanoma  -Referred to dermatologist for further evaluation and management.  Patient  agreeable with the plan.    Call if you develop any new or worsening symptoms  Proceed to urgent care or ED after hours if necessary    Huntley Dec, MD        Return in about 1 year (around 09/29/2021) for CPE in 1 year.

## 2020-09-29 NOTE — Progress Notes (Signed)
Have you seen any specialists/other providers since your last visit with us?    No    Arm preference verified?   Yes    The patient is due for nothing at this time, HM is up-to-date.

## 2020-09-30 NOTE — Progress Notes (Signed)
Lab reports communicated with patient via MyChart.    Reviewed your blood work.    -Sugar at good range.  Diabetic test (hemoglobin A1c which reflects last 3 months average sugar) at 5 range which is nondiabetic range.  -Kidney function, liver function within normal range.  -Cholesterol at good range.  Please continue low-cholesterol diet and exercise regular basis.  -Blood count unremarkable  -Thyroid lab within normal range.  -Urine test unremarkable for any significant abnormalities.

## 2020-10-30 ENCOUNTER — Ambulatory Visit (INDEPENDENT_AMBULATORY_CARE_PROVIDER_SITE_OTHER): Payer: BC Managed Care – PPO | Admitting: Student in an Organized Health Care Education/Training Program

## 2020-10-30 ENCOUNTER — Encounter (INDEPENDENT_AMBULATORY_CARE_PROVIDER_SITE_OTHER): Payer: Self-pay | Admitting: Student in an Organized Health Care Education/Training Program

## 2020-10-30 DIAGNOSIS — G43019 Migraine without aura, intractable, without status migrainosus: Secondary | ICD-10-CM

## 2020-10-30 MED ORDER — SUMATRIPTAN SUCCINATE 100 MG PO TABS
100.0000 mg | ORAL_TABLET | ORAL | 11 refills | Status: DC | PRN
Start: 2020-10-30 — End: 2021-02-12

## 2020-10-30 MED ORDER — PROCHLORPERAZINE MALEATE 10 MG PO TABS
10.0000 mg | ORAL_TABLET | Freq: Three times a day (TID) | ORAL | 5 refills | Status: DC | PRN
Start: 2020-10-30 — End: 2021-10-23

## 2020-10-30 MED ORDER — NAPROXEN 500 MG PO TABS
500.0000 mg | ORAL_TABLET | Freq: Three times a day (TID) | ORAL | 5 refills | Status: DC | PRN
Start: 2020-10-30 — End: 2021-10-23

## 2020-10-30 NOTE — Patient Instructions (Addendum)
As Daily Medication for Headache Prevention you should take these over the counter natural supplements:  -->MAGNESIUM 400mg -500mg  DAILY (Magnesium supplementation can help decrease headaches. Can increase further to 400-500mg  twice daily if headache persists. Magnesium can have a laxative effect, so side effects can include diarrhea and stomach cramping. If side effects, can lower dosage to 200-250mg  daily).  -->RIBOFLAVIN (Vitamin B2) 400mg  DAILY (if having a hard time finding this, can try at whole foods, amazon, or vitamin store)  - Magnesium and/or Riboflavin need to be taken daily for a positive effect on headache frequency and severity (this may take up to 1-2 months).    - If no improvement in 2 months, then we can consider prescription preventive medication such as topamax or nortriptyline    AS RESCUE MEDICATION FOR MODERATE TO SEVERE HEADACHE:  - Take Sumatriptan (Imitrex) 100 mg by mouth at headache onset.    - If you still have a headache after 2 hours, you can take an additional dose.   - Do not take more than 200mg  in 24 hours   - This may cause a flushing reaction or make you a bit tired.   - DO NOT TAKE MORE THAN 3 DAYS IN A WEEK.    As Rescue Medication for when headache becomes moderate to severe intensity:  - Take Prochlorperazine (Compazine) 10mg  + Naproxen 500mg  every 6-8 hours as needed (can try to take naproxen with meals, but not necessary).  - The compazine may make you a bit sleepy. If it is too sedating, can cut in half and take 5mg .  - You can try to take the compazine alone once and see if that help abort the headache. Often for a more severe headache, taking the medications together works better.  - Do not take rescue medications more than 3-4 days in the week    Patient advised to follow the SEEDS for success in headache management, including Sleep hygiene, Exercising regularly, Eating healthy and regular meals, Drinking water, keeping a headache Diary, and Stress reduction.  Patient  advised to limit acute medication use to no more than 3 days per week    - If no improvement or worsening by next follow up, then we can consider repeating MRI brain.

## 2020-10-30 NOTE — Progress Notes (Signed)
[facility] - NEW OUTPATIENT ENCOUNTER   [address]  [place], [place] [address]  Phone: [phone]    Patient Name: Christopher Trevino  MRN: [id number]  Primary Care MD: Len Childs, MD Phone: [phone]    Date: 10/30/2020    Chief Complaint:   Chief Complaint   Patient presents with    Migraine       History of Present Illness:  Christopher Trevino is a 45 y.o. male with past medical history significant for migraines, Covid 06/2020, who presents today for neurologic consultation regarding headaches.    He thinks headaches began in college [phone]), but was not sure what was going on because he partied a lot in college just thought he was hungover. As he got older, was drinking less, and noticed migraines were still there. Over the past few years, he has been taking sumatriptan as needed. If he exercises late at night and then he does not have enough time to rehydrate, he gets a migraine. If he exercises late and drinks a beer then he can get a migraine. If he works all day and then has a glass of wine or a beer, he has a migraine the next day. So, he thinks it is alcohol induced and dehydration. So, over the past few years has focused on on staying hydrated and he cuts back on alcohol a lot to only on the weekends and even then he will likely have a migraine.    Will sleep terribly and then wake up in the morning with a beginning of a headache and then at 1pm it turns into migraine and by 5pm it is full on migraine until 9-10pm unless he takes the sumatriptan    If he wakes up in the morning and takes the sumatriptan right away before it gets worse, the he does not get it. But if he takes it later in the evening around 4-5pm it takes 2 hours to kick in and then it is gone.    He did have covid-19 infection January 2022, not sure if headaches specifically got worse afterwards.     When he taps his face on the R or brushes his teeth, it does not bring on the pain.     HA frequency and duration:  1-2 per week, can last the full day or shorter if he takes the medication.  Has been at this frequency over the past few years.  Does not get HA in between the migraines    HA location: always R sinus pain and pain behind right eye and then travels back on the right side    HA severity: 10/10 when he gets migraine    HA character: stabbing, throbbing, pulsating   Just tries to go to bed    Previous Preventive Medications: none    Previous Abortive Medications: occasionally advil in the morning; Sumatriptan - does take it away completely    Family Hx: mom used to have migraines when he was a kid but she does not anymore  Aura: no  Laterality: right face/retro-orbital then travels posteriorly   Photophobia: yes  Phonophobia: yes  Nausea: yes  Vomiting: no   Dizziness: no  Vision changes: no  Unilateral symptoms: right when its about to get more severe, his sinus gets inflamed on R side and nose will start to run a little bit, does Affin quickly, and then takes sumatriptan and waits for it to end. No tearing of the eye  but does feel that his right eye can swell.  Hemibody numbness/tingling/weakness: no  Transient Visual Obscurations: no   Worse when lying down: not worse but does try and lie down  Morning headaches: yes sometimes  Cutaneous allodynia: sometimes applies pressure to temporarily relieve it; if puts ice pack on his face sometimes that pressure hurts  Sleep: sleeping fine  Triggers: alcohol, stress, exercise/dehydration (does a lot of cycling, sweats a lot)  Caffeine: 1 cup coffee in AM and maybe one later at 10am, does not help migraines  Head Trauma: no  Psych: no depression or anxiety  History of renal stones: no  History of HTN: parents have HTN but he does not have it  History of snoring: does not snore anymore, got tonsils out 2 years ago  Previous work-up: MRI brain 2017  For hearing loss woke up one morning with tinnitus and slight hearing loss (e,g. Could not hear birds chirping anymore), in 2019  he had abscess in throat was in hospital for 5 days and afterwards followed up with ENT she tested his hearing and suggested a hearing aid because he had loss of higher frequencies.    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 When you have headaches, how often is the pain severe?    Always (13)   2. How often do headaches limit your ability to do usual daily activities including household work, work, school, or social activities?    Always (13)     3. When you have a headache, how often do you wish you could lie down?    Always (13)   4. In the past 4 weeks, how often have you felt too tired to do work or daily activities because of your headaches?   Very Often (11)   5. In the past 4 weeks, how often have you felt fed up or irritated because of your headaches?   Always (13)   6. In the past 4 weeks, how often did headaches limit your ability to concentrate on work or daily activities?    Always (13)   Total 76     HIT-6 score interpretation: scores ?49 represent little or no impact; scores between 50 and 55 represent some impact; scores between 56 and 59 represent substantial impact; and scores ?60 indicate severe impact       Medications:  Current Outpatient Medications   Medication Sig Dispense Refill    SUMAtriptan (Imitrex) 100 MG tablet Take 1 tablet (100 mg total) by mouth as needed in the morning for Migraine. May repeat one time in 24 hours. Ca not take more than 100 mg/day;. 30 tablet 1    naproxen (NAPROSYN) 500 MG tablet Take 1 tablet (500 mg total) by mouth every 8 (eight) hours as needed (for moderate to severe migraine; no more than 3 days per week) 30 tablet 5    prochlorperazine (COMPAZINE) 10 MG tablet Take 1 tablet (10 mg total) by mouth every 8 (eight) hours as needed (for moderate to severe migraine; no more than 3 days per week) 30 tablet 5    SUMAtriptan (IMITREX) 100 MG tablet Take 1 tablet (100 mg total) by mouth every 2 (two) hours as needed for Migraine 9 tablet 11     No current  facility-administered medications for this visit.       Past Medical History:  Past Medical History:   Diagnosis Date    Attention deficit hyperactivity disorder (ADHD)     History of  migraine     LAST MIGRANE Aug 17 2017  EVEN 1 BEER OR GLASS OF WINE BRINGS IT ON       Past Surgical History:  Past Surgical History:   Procedure Laterality Date    INCISION & DRAINAGE, RETROPHARYNGEAL ABSCESS Left 07/01/2017    Procedure: INCISION & DRAINAGE, RETROPHARYNGEAL ABSCESS;  Surgeon: Francis Gaines, MD;  Location: [facility];  Service: ENT;  Laterality: Left;    NASAL SEPTUM SURGERY  AGE 36    TONSILLECTOMY N/A 08/31/2017    Procedure: TONSILLECTOMY AND EXCISION OF FRENULUM LESION;  Surgeon: Francis Gaines, MD;  Location: [facility];  Service: ENT;  Laterality: N/A;       Allergies:  Patient has no known allergies.    Family History:  Family History   Problem Relation Age of Onset    Hyperlipidemia Mother     Hypertension Mother     Hyperlipidemia Father     Hypertension Father        Social History:  Social History     Tobacco Use    Smoking status: Former Smoker     Packs/day: 0.50     Years: 3.00     Pack years: 1.50     Quit date: 05/10/1999     Years since quitting: 21.4    Smokeless tobacco: Never Used   Vaping Use    Vaping Use: Never used   Substance Use Topics    Alcohol use: Yes     Alcohol/week: 0.0 - 2.0 standard drinks     Comment: USED TO DO 6 BEE A WK DECREASED Jun 24 2017    Drug use: No     Review of Systems:  Constitutional: Negative for fever.   Respiratory: Negative for shortness of breath.    Cardiovascular: Negative for chest pain.   Gastrointestinal: Negative for abdominal pain.   Neurological: Negative for visual changes, alteration in awareness, speech or language problems, facial droop, focal weakness, focal numbness, limb incoordination, gait ataxia, dysphagia, or dizziness.   Psychiatric/Behavioral: Negative for mood disturbance.   All other systems reviewed and are  negative except pertinent positives as noted above in HPI.     General Exam:  BP 132/90    Pulse 74    Ht 1.702 m (5\' 7" )    Wt 67.1 kg (148 lb)    BMI 23.18 kg/m   Gen:  Well-developed, well-nourished.  No acute distress.  Cooperative with exam.  HEENT:  Normocephalic, atraumatic. Trachea midline  Neck:  Supple. Normal range of motion.   CV:  RRR.  S1/S2.    Lungs:  Normal effort.  Extrem:  No edema.  Skin:  No obvious rashes or lesions.   Psych: Mood and affect are appropriate.    Neuro Exam:  MENTAL STATUS:  Awake, alert, oriented x 3.  Affect is normal. Normal attention and concentration.  Follows commands. Speech is fluent, non dysarthric.  No apraxia or neglect. Recent/short term and remote/long term memory intact. Fund of knowledge appropriate for age/education.    CRANIAL NERVES:    CN I - Not tested.   CN II - Visual fields are full to confrontation.   CN III, IV, VI - PERRL.  EOMI.  No nystagmus.   CN V - Intact facial sensation to light touch.  CN VII - Facial movements symmetric.   CN VIII - Hearing intact to conversational speech (L hearing aid)   CN IX, X - Normal phonation.  CN XI -  Shoulder shrug symmetric.   MOTOR:  Normal bulk and tone.  No pronator drift.  Strength is full (5/5) and symmetric.  No asterixis, tremor, myoclonus or other abnormal movements.   SENSATION:  Intact to light touch, pin prick, temperature, and vibratory sense in upper and lower extremities bilaterally.   REFLEXES: 2+ throughout (biceps 2+, triceps 2+, brachioradialis 2+, patellar 2+, achilles 2+ bilaterally).  COORDINATION: Finger nose finger without dysmetria. Rapid alternating movements are smooth and fast.   GAIT:  Normal gait and station. Normal tandem.     Investigations:  Labs:    Results for orders placed or performed in visit on 09/29/20   CBC and differential   Result Value Ref Range    WBC 6.19 3.10 - 9.50 x10 3/uL    Hgb 15.8 12.5 - 17.1 g/dL    Hematocrit 57.8 46.9 - 49.6 %    Platelets 234 142 - 346 x10 3/uL     RBC 5.14 4.20 - 5.90 x10 6/uL    MCV 90.1 78.0 - 96.0 fL    MCH 30.7 25.1 - 33.5 pg    MCHC 34.1 31.5 - 35.8 g/dL    RDW 11 11 - 15 %    MPV 11.0 8.9 - 12.5 fL    Neutrophils 56.6 None %    Lymphocytes Automated 33.8 None %    Monocytes 8.1 None %    Eosinophils Automated 0.5 None %    Basophils Automated 0.8 None %    Immature Granulocytes 0.2 None %    Nucleated RBC 0.0 0.0 - 0.0 /100 WBC    Neutrophils Absolute 3.51 1.10 - 6.33 x10 3/uL    Lymphocytes Absolute Automated 2.09 0.42 - 3.22 x10 3/uL    Monocytes Absolute Automated 0.50 0.21 - 0.85 x10 3/uL    Eosinophils Absolute Automated 0.03 0.00 - 0.44 x10 3/uL    Basophils Absolute Automated 0.05 0.00 - 0.08 x10 3/uL    Immature Granulocytes Absolute 0.01 0.00 - 0.07 x10 3/uL    Absolute NRBC 0.00 0.00 - 0.00 x10 3/uL   Comprehensive metabolic panel   Result Value Ref Range    Glucose 76 70 - 100 mg/dL    BUN 62.9 9.0 - 52.8 mg/dL    Creatinine 1.0 0.5 - 1.5 mg/dL    Sodium 413 244 - 010 mEq/L    Potassium 3.9 3.5 - 5.1 mEq/L    Chloride 103 100 - 111 mEq/L    CO2 28 21 - 29 mEq/L    Calcium 9.7 8.5 - 10.5 mg/dL    Protein, Total 7.3 6.0 - 8.3 g/dL    Albumin 4.5 3.5 - 5.0 g/dL    AST (SGOT) 16 5 - 34 U/L    ALT 15 0 - 55 U/L    Alkaline Phosphatase 76 37 - 117 U/L    Bilirubin, Total 1.0 0.2 - 1.2 mg/dL    Globulin 2.8 2.0 - 3.7 g/dL    Albumin/Globulin Ratio 1.6 0.9 - 2.2    Anion Gap 8.0 5.0 - 15.0   TSH   Result Value Ref Range    TSH 0.50 0.35 - 4.94 uIU/mL   Lipid panel   Result Value Ref Range    Cholesterol 186 0 - 199 mg/dL    Triglycerides 53 34 - 149 mg/dL    HDL 73 40 - 2,725 mg/dL    LDL Calculated 366 (H) 0 - 99 mg/dL    VLDL Calculated 11 10 - 40 mg/dL  Cholesterol / HDL Ratio 2.5 See Below   Urinalysis   Result Value Ref Range    Urine Type Clean Catch     Color, UA YELLOW Colorless - Yellow    Clarity, UA CLEAR Clear - Hazy    Specific Gravity UA 1.013 1.001 - 1.035    Urine pH 6.0 5.0 - 8.0    Leukocyte Esterase, UA NEGATIVE Negative     Nitrite, UA NEGATIVE Negative    Protein, UR NEGATIVE Negative    Glucose, UA NEGATIVE Negative    Ketones UA 40 (A) Negative    Urobilinogen, UA 0.2 0.2 - 2.0    Bilirubin, UA NEGATIVE Negative    Blood, UA NEGATIVE Negative   Hemoglobin A1C   Result Value Ref Range    Hemoglobin A1C 5.0 4.6 - 5.9 %    Average Estimated Glucose 96.8 mg/dL   GFR   Result Value Ref Range    EGFR >60.0    Hemolysis index   Result Value Ref Range    Hemolysis Index 3 0 - 24       Imaging:      Results for orders placed or performed in visit on 03/23/16   MRI Brain W WO Contrast    Narrative                         [facility]                                 [facility]                                EXAM PERFORMED AT:  [id number]       DOB:1975-07-15                 [address]  03/23/2016     AGE:74   Gender:M              Physicians Only: [phone]             MARK Lezlie Lye MD                                  [F]       [address]       [place], [place] [address]        MRI BRAIN AND IAC WITHOUT AND WITH CONTRAST    HISTORY: Hearing loss.    TECHNIQUE: Multiplanar MR imaging of the brain with attention to the  internal auditory canals was performed on a 3.0 Tesla MRI scanner. Images  were acquired prior to and following the intravenous administration of 6.5  cc of Gadavist.    FINDINGS: The internal auditory canals, seventh and eighth cranial nerves  appear unremarkable. No abnormal mass or enhancement is seen within the  internal auditory canals. No cerebellopontine angle masses are seen.    In the remainder of the brain, the cerebral parenchyma demonstrates normal  signal characteristics. There is no restricted diffusion to suggest acute  infarction. There is no evidence of acute intracranial hemorrhage. The  ventricles are normal without hydrocephalus. There is no mass effect or  midline shift. No extra-axial collections are seen. No  areas of  abnormal  enhancement are seen within the brain on the postcontrast images. The major  intracranial flow voids are preserved. The orbits appear unremarkable. The  visualized portions of the paranasal sinuses are clear.      Impression     No acute intracranial abnormality. Normal MR examination of the  internal auditory canals and cerebellopontine angles without evidence of  acoustic schwannoma.    Electronically signed by: Georgann Housekeeper M.D.  [Interpreted at: [facility], [facility]    RC: 03/24/16       Other Studies:      Assessment:  In summary, Christopher Trevino is a 45 y.o. male with PMH migraines (onset ~1996), Covid 06/2020, who presents today for neurologic consultation regarding headaches. His migraines are located on the right side, associated with photophonophobia/nausea, and are triggered by alcohol/dehydration. He is having 1-2 migraines per week which can last all day if he does not take sumatriptan early (which does help to knock them out). MRI from 2017 was normal. We will start with natural supplementation and then if no improvement consider prescription preventive. He will continue using sumatriptan and I am also giving him compazine/naproxen as an alternative rescue as well.     Plan:    1. Intractable migraine without aura and without status migrainosus       As Daily Medication for Headache Prevention you should take these over the counter natural supplements:  -->MAGNESIUM 400mg -500mg  DAILY (Magnesium supplementation can help decrease headaches. Can increase further to 400-500mg  twice daily if headache persists. Magnesium can have a laxative effect, so side effects can include diarrhea and stomach cramping. If side effects, can lower dosage to 200-250mg  daily).  -->RIBOFLAVIN (Vitamin B2) 400mg  DAILY (if having a hard time finding this, can try at whole foods, amazon, or vitamin store)  - Magnesium and/or Riboflavin need to be taken daily for a positive effect on headache  frequency and severity (this may take up to 1-2 months).    - If no improvement in 2 months, then we can consider prescription preventive medication such as topamax or nortriptyline    AS RESCUE MEDICATION FOR MODERATE TO SEVERE HEADACHE:  - Take Sumatriptan (Imitrex) 100 mg by mouth at headache onset.    - If you still have a headache after 2 hours, you can take an additional dose.   - Do not take more than 200mg  in 24 hours   - This may cause a flushing reaction or make you a bit tired.   - DO NOT TAKE MORE THAN 3 DAYS IN A WEEK.    As Rescue Medication for when headache becomes moderate to severe intensity:  - Take Prochlorperazine (Compazine) 10mg  + Naproxen 500mg  every 6-8 hours as needed (can try to take naproxen with meals, but not necessary).  - The compazine may make you a bit sleepy. If it is too sedating, can cut in half and take 5mg .  - You can try to take the compazine alone once and see if that help abort the headache. Often for a more severe headache, taking the medications together works better.  - Do not take rescue medications more than 3-4 days in the week    Patient advised to follow the SEEDS for success in headache management, including Sleep hygiene, Exercising regularly, Eating healthy and regular meals, Drinking water, keeping a headache Diary, and Stress reduction.  Patient advised to limit acute medication use to no more than 3 days per week    -  If no improvement or worsening by next follow up, then we can consider repeating MRI brain.    I have spent 55 minutes on the day of service including face to face time with patient, coordinating care, record review, and documentation.     Diannia Ruder, MD  [facility]  Board Certified in Adult Neurology, Epilepsy, and Clinical Neurophysiology by the ABPN

## 2021-01-30 ENCOUNTER — Telehealth (INDEPENDENT_AMBULATORY_CARE_PROVIDER_SITE_OTHER): Payer: Self-pay | Admitting: Student in an Organized Health Care Education/Training Program

## 2021-02-12 ENCOUNTER — Other Ambulatory Visit (INDEPENDENT_AMBULATORY_CARE_PROVIDER_SITE_OTHER): Payer: Self-pay | Admitting: Student in an Organized Health Care Education/Training Program

## 2021-02-14 MED ORDER — SUMATRIPTAN SUCCINATE 100 MG PO TABS
100.0000 mg | ORAL_TABLET | ORAL | 11 refills | Status: DC | PRN
Start: 2021-02-14 — End: 2021-03-31

## 2021-03-16 ENCOUNTER — Other Ambulatory Visit (INDEPENDENT_AMBULATORY_CARE_PROVIDER_SITE_OTHER): Payer: Self-pay | Admitting: Internal Medicine

## 2021-03-31 ENCOUNTER — Other Ambulatory Visit (INDEPENDENT_AMBULATORY_CARE_PROVIDER_SITE_OTHER): Payer: Self-pay | Admitting: Student in an Organized Health Care Education/Training Program

## 2021-04-02 MED ORDER — SUMATRIPTAN SUCCINATE 100 MG PO TABS
100.0000 mg | ORAL_TABLET | ORAL | 11 refills | Status: DC | PRN
Start: 2021-04-02 — End: 2021-05-30

## 2021-04-18 IMAGING — DX DG ELBOW COMPLETE 3+V*L*
4 series · 4 of 4 positions shown · non-contrast
Comparison: None.

CLINICAL DATA: Left elbow pain after fall

EXAM:
LEFT ELBOW - COMPLETE 3+ VIEW

[elbow ap]
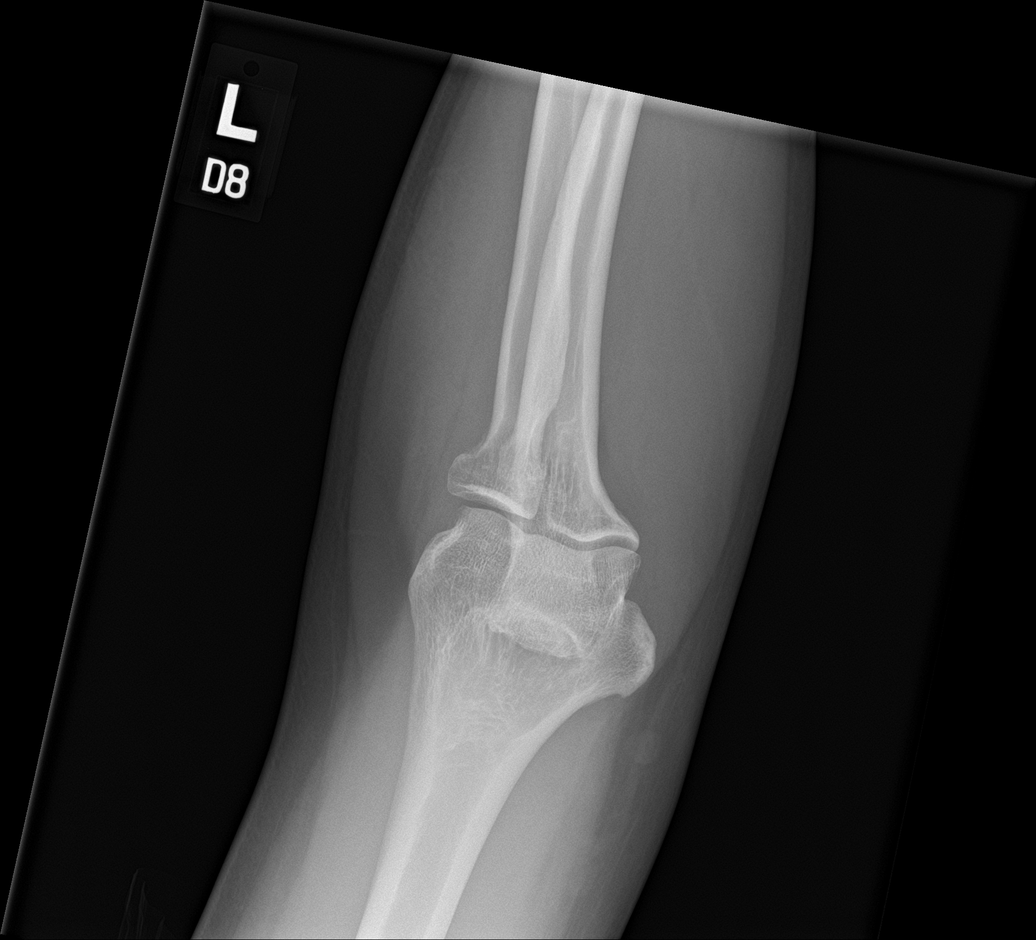

[elbow obl (1 of 2)]
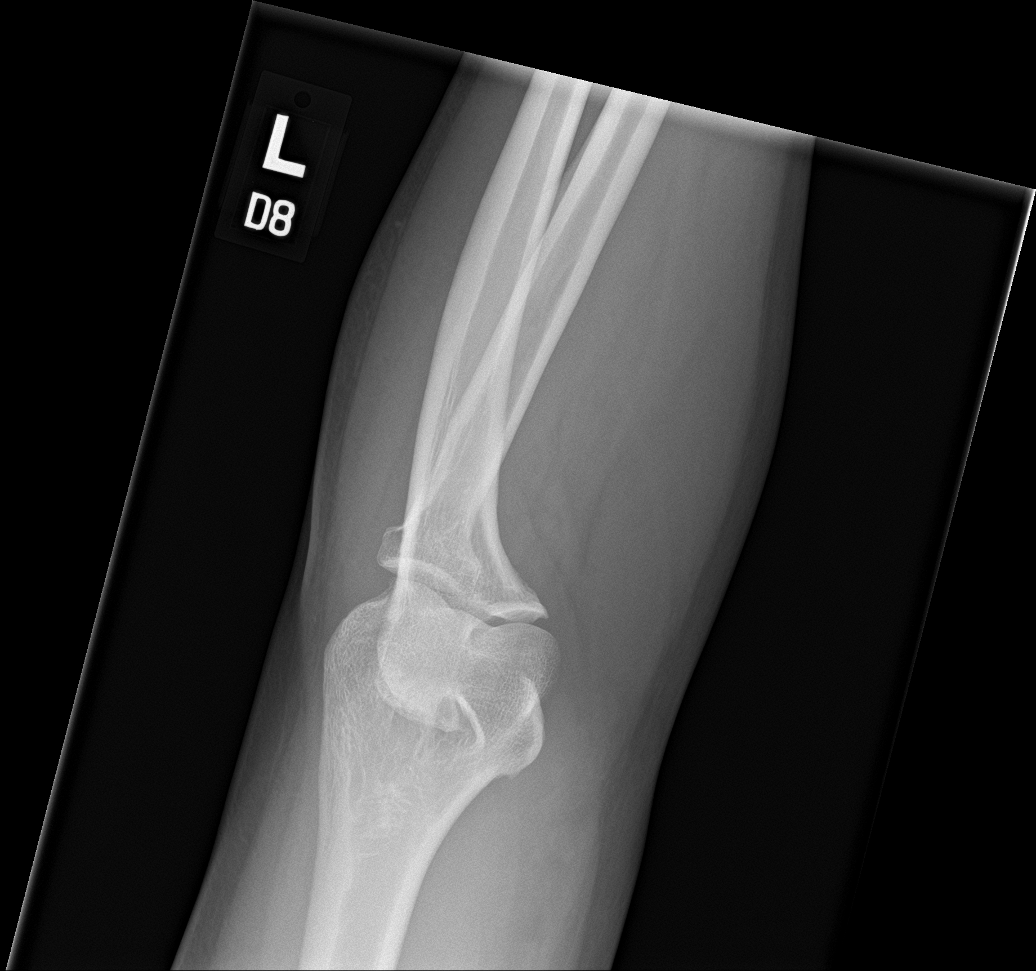

[elbow obl (2 of 2)]
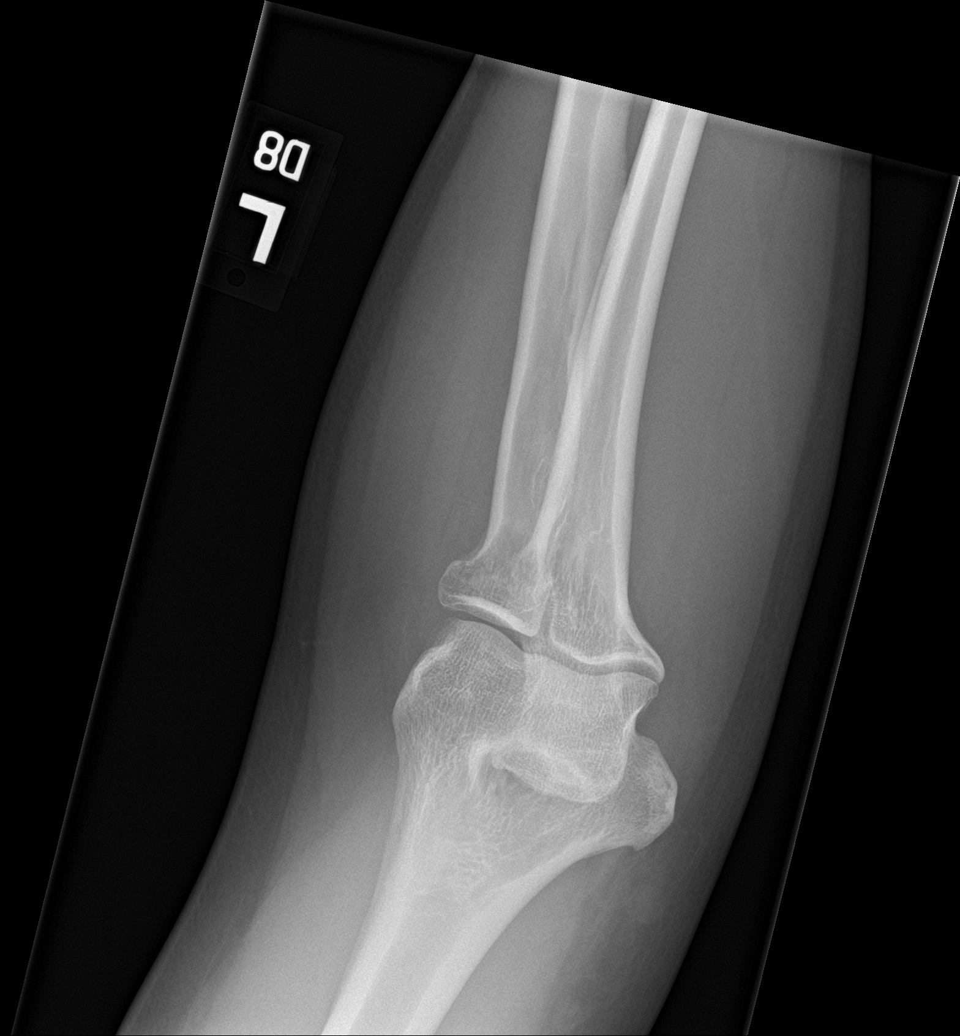

[elbow lat]
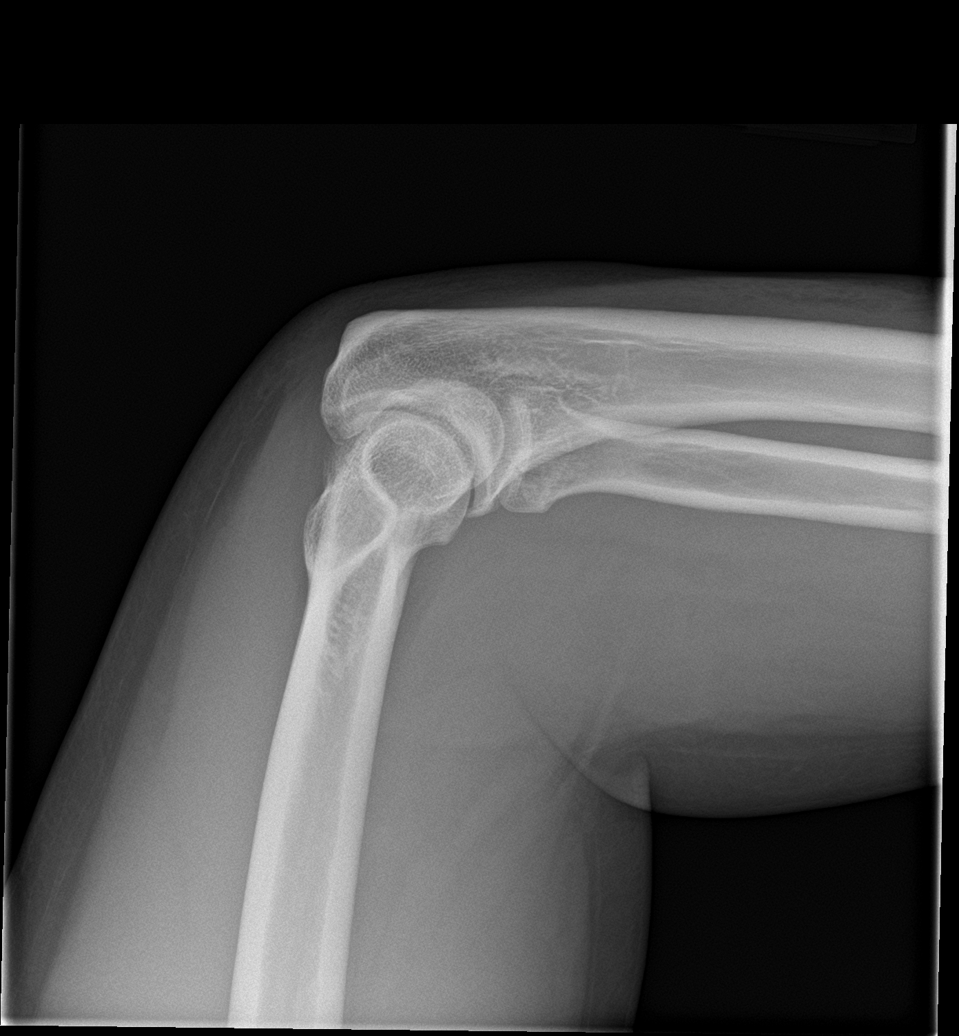

[4 of 4 positions shown; findings below may reference images not displayed]

FINDINGS: Acute nondisplaced intra-articular fracture of the left radial head
and neck. Subtle cortical irregularity of the posterior tip of the
olecranon may represent an additional nondisplaced fracture. Bony
alignment is maintained without dislocation. There is a
moderate-sized elbow joint hemarthrosis. Soft tissue swelling and
edema noted posteriorly.
IMPRESSION: 1. Acute nondisplaced intra-articular fracture of the left radial
head and neck.
2. Subtle cortical irregularity of the posterior tip of the
olecranon may represent an additional nondisplaced fracture.
3. Moderate elbow joint hemarthrosis.

## 2021-05-05 ENCOUNTER — Encounter (INDEPENDENT_AMBULATORY_CARE_PROVIDER_SITE_OTHER): Payer: Self-pay

## 2021-05-30 ENCOUNTER — Other Ambulatory Visit (INDEPENDENT_AMBULATORY_CARE_PROVIDER_SITE_OTHER): Payer: Self-pay | Admitting: Student in an Organized Health Care Education/Training Program

## 2021-06-04 ENCOUNTER — Encounter (INDEPENDENT_AMBULATORY_CARE_PROVIDER_SITE_OTHER): Payer: Self-pay

## 2021-06-07 MED ORDER — SUMATRIPTAN SUCCINATE 100 MG PO TABS
100.0000 mg | ORAL_TABLET | ORAL | 11 refills | Status: DC | PRN
Start: 2021-06-07 — End: 2021-07-14

## 2021-07-05 ENCOUNTER — Encounter (INDEPENDENT_AMBULATORY_CARE_PROVIDER_SITE_OTHER): Payer: Self-pay

## 2021-07-14 ENCOUNTER — Other Ambulatory Visit (INDEPENDENT_AMBULATORY_CARE_PROVIDER_SITE_OTHER): Payer: Self-pay | Admitting: Student in an Organized Health Care Education/Training Program

## 2021-07-16 MED ORDER — SUMATRIPTAN SUCCINATE 100 MG PO TABS
100.0000 mg | ORAL_TABLET | ORAL | 11 refills | Status: DC | PRN
Start: 2021-07-16 — End: 2021-11-01

## 2021-08-05 ENCOUNTER — Encounter (INDEPENDENT_AMBULATORY_CARE_PROVIDER_SITE_OTHER): Payer: Self-pay

## 2021-09-02 ENCOUNTER — Encounter (INDEPENDENT_AMBULATORY_CARE_PROVIDER_SITE_OTHER): Payer: Self-pay

## 2021-09-10 ENCOUNTER — Other Ambulatory Visit (INDEPENDENT_AMBULATORY_CARE_PROVIDER_SITE_OTHER): Payer: Self-pay | Admitting: Student in an Organized Health Care Education/Training Program

## 2021-09-15 ENCOUNTER — Other Ambulatory Visit (INDEPENDENT_AMBULATORY_CARE_PROVIDER_SITE_OTHER): Payer: Self-pay | Admitting: Student in an Organized Health Care Education/Training Program

## 2021-09-15 ENCOUNTER — Encounter: Payer: Self-pay | Admitting: Student in an Organized Health Care Education/Training Program

## 2021-09-15 ENCOUNTER — Other Ambulatory Visit (INDEPENDENT_AMBULATORY_CARE_PROVIDER_SITE_OTHER): Payer: Self-pay | Admitting: Internal Medicine

## 2021-09-15 DIAGNOSIS — G43909 Migraine, unspecified, not intractable, without status migrainosus: Secondary | ICD-10-CM

## 2021-09-15 MED ORDER — SUMATRIPTAN SUCCINATE 100 MG PO TABS
100.0000 mg | ORAL_TABLET | Freq: Every day | ORAL | 1 refills | Status: DC | PRN
Start: 2021-09-15 — End: 2021-11-01

## 2021-10-01 ENCOUNTER — Encounter (INDEPENDENT_AMBULATORY_CARE_PROVIDER_SITE_OTHER): Payer: BC Managed Care – PPO | Admitting: Internal Medicine

## 2021-10-03 ENCOUNTER — Encounter (INDEPENDENT_AMBULATORY_CARE_PROVIDER_SITE_OTHER): Payer: Self-pay

## 2021-10-21 NOTE — Progress Notes (Unsigned)
Subjective:      Patient ID: Christopher Trevino is a 46 y.o. male.    Chief Complaint:  No chief complaint on file.      HPI   Christopher Trevino, is presenting in clinic today for annual physical examination.  Patient is overall doing well and taking care of his health with healthy diet and also doing regular exercises.  He also requests a refill for his ADD medication and reports he has been using that since childhood.    Visit Type: Health Maintenance Visit  Work Status: working full-time [employer]  Reported Health: excellent health  Reported Diet: moderate compliance with well-balanced diet  Reported Exercise: regularly, 15-30 minutes/day, Biking and walking  Dental: regular dental visits twice a year  Vision: eye exam > 1 year ago  Hearing: normal hearing  Immunization Status: immunizations up to date and Influenza vaccination due  Reproductive Health: sexually active  Prior Screening Tests: last colonoscopy in 2019 and colon cancer screening not approriate at this time  General Health Risks: no family history of prostate cancer and no family history of colon cancer  Safety Elements Used: uses seat belts, sunscreen use and does not text and drive    Problem List:  Patient Active Problem List   Diagnosis    Peritonsillar abscess    Hyperglycemia    History of migraine    Lower abdominal pain    Colitis    Rectal bleeding    Status post tonsillectomy    Attention deficit hyperactivity disorder (ADHD), unspecified ADHD type    Other migraine without status migrainosus, not intractable    Alcohol use, unspecified with unspecified alcohol-induced disorder    Numerous skin moles    Intractable migraine without aura and without status migrainosus       Current Medications:  No outpatient medications have been marked as taking for the 10/23/21 encounter (Appointment) with Elenor Wildes, Staci Acosta, MD.       Allergies:  No Known Allergies    Past Medical History:  Past Medical History:   Diagnosis Date    Attention deficit hyperactivity  disorder (ADHD)     History of migraine     LAST MIGRANE Aug 17 2017  EVEN 1 BEER OR GLASS OF WINE BRINGS IT ON       Past Surgical History:  Past Surgical History:   Procedure Laterality Date    INCISION & DRAINAGE, RETROPHARYNGEAL ABSCESS Left 07/01/2017    Procedure: INCISION & DRAINAGE, RETROPHARYNGEAL ABSCESS;  Surgeon: Francis Gaines, MD;  Location: [facility];  Service: ENT;  Laterality: Left;    NASAL SEPTUM SURGERY  AGE 43    TONSILLECTOMY N/A 08/31/2017    Procedure: TONSILLECTOMY AND EXCISION OF FRENULUM LESION;  Surgeon: Francis Gaines, MD;  Location: [facility];  Service: ENT;  Laterality: N/A;       Family History:  Family History   Problem Relation Age of Onset    Hyperlipidemia Mother     Hypertension Mother     Hyperlipidemia Father     Hypertension Father        Social History:  Social History     Tobacco Use    Smoking status: Former     Packs/day: 0.50     Years: 3.00     Pack years: 1.50     Types: Cigarettes     Quit date: 05/10/1999     Years since quitting: 22.4    Smokeless tobacco: Never   Vaping  Use    Vaping status: Never Used   Substance Use Topics    Alcohol use: Yes     Alcohol/week: 0.0 - 2.0 standard drinks of alcohol     Comment: USED TO DO 6 BEE A WK DECREASED Jun 24 2017    Drug use: No          The following sections were reviewed this encounter by the provider:          Review of Systems   General/Constitutional:   Denies Change in appetite. Denies Chills. Denies Fatigue. Denies Fever. Denies Weight gain. Denies Weight loss.   Ophthalmologic:   Denies Blurred vision. Denies Diminished visual acuity. Denies Eye Pain.   ENT:   Denies Hearing Loss. Denies Nasal Discharge. Denies Hoarseness. Denies Ear pain. Denies Nosebleed. Denies Sinus pain. Denies Sore throat. Denies Sneezing.   Endocrine:   Denies Polydipsia. Denies Polyuria.   Cardiovascular:   Denies Chest pain. Denies Chest pain with exertion. Denies Leg Claudication. Denies Palpitations. Denies Swelling in  hands/feet.   Respiratory:   Denies Paroxysmal Nocturnal Dyspnea. Denies Cough. Denies Orthopnea. Denies Shortness of breath. Denies Daytime Hypersomnolence. Denies Snoring. Denies Witness Apnea. Denies Wheezing.   Gastrointestinal:   Denies Abdominal pain. Denies Blood in stool. Denies Constipation. Denies Diarrhea. Denies Heartburn. Denies Nausea. Denies Vomiting.   Hematology:   Denies Easy bruising. Denies Easy Bleeding.   Genitourinary:   Denies Blood in urine. Denies Nocturia.   Musculoskeletal:   Denies Joint pain. Denies Leg cramps. Denies Weakness in LE. Denies Swollen joints.   Peripheral Vascular:   Denies Cold extremities. Denies Decreased sensation in extremities. Denies Painful extremities.   Skin:   Denies Itching. Denies Change in Mole(s). Denies Rash.   Neurologic:   Denies Balance difficulty. Denies Dizziness. Denies Headache. Denies Pre-Syncope. Denies Memory loss.   Psychiatric:   Denies Anxiety. Denies Depressed mood. Denies Difficulty sleeping. Denies Mood Swings.    Objective:   Vitals:  There were no vitals taken for this visit.    Physical Exam   GENERAL APPEARANCE: alert, in no acute distress, well developed, well nourished, oriented to time, place, and person.   HEAD: normal appearance, atraumatic.   EYES: extraocular movement intact (EOMI), pupils equal, round, reactive to light and accommodation, sclera anicteric, conjunctiva clear.   EARS: tympanic membranes normal bilaterally, external canals normal .   NOSE: normal nasal mucosa, no lesions.   ORAL CAVITY: normal oropharynx, normal lips, mucosa moist,   THROAT: normal appearance, clear, no erythema.   NECK/THYROID: neck supple, no carotid bruit,  no cervical lymphadenopathy, no neck mass palpated, no jugular venous distention, no thyromegaly.   SKIN: good turgor, no rashes, no suspicious lesions.   HEART: S1, S2 normal, no murmurs, rubs, gallops, regular rate and rhythm.   LUNGS: normal effort / no distress, normal breath sounds, clear  to auscultation bilaterally, no wheezes, rales, rhonchi.   ABDOMEN: bowel sounds present, no hepatosplenomegaly, soft, nontender, nondistended.   MUSCULOSKELETAL: full range of motion, no swelling or deformity.   EXTREMITIES: no edema, no clubbing, cyanosis, or edema.   PERIPHERAL PULSES: 2+ dorsalis pedis, 2+ posterior tibial.   NEUROLOGIC: nonfocal, cranial nerves 2-12 grossly intact, deep tendon reflexes 2+ symmetrical, normal strength, tone and reflexes, sensory exam intact.   PSYCH: cognitive function intact, mood/affect full range, speech clear.    Assessment/Plan:       1. Physical exam  - CBC and differential  - Comprehensive metabolic panel  - TSH  -  Lipid panel  - Urinalysis  - Hemoglobin A1C    2. Attention deficit hyperactivity disorder (ADHD), unspecified ADHD type  - Urine Drug Abuse Panel With Conf  - Concerta 18 MG CR tablet; Take 1 tablet (18 mg total) by mouth every morning  Dispense: 30 tablet; Refill: 0  Patient is using it in as needed basis to perform at his work.  He reports he has been using since childhood and is not interested to go to psychiatrist for any new testing.  I understand this is a controlled substance & he signed the contract.  He will closely follow-up with me.    3. Need for viral immunization  - Flu vacc QUAD PF 6 MOS & UP (FLULAVAL/FlUARIX)    4. Alcohol use, unspecified with unspecified alcohol-induced disorder  He is overall cutting down his alcohol use to minimal and days of week does not have any signs and symptoms of dependence    5. Elevated blood pressure reading  He has a blood pressure machine at home and will check and notify me through my chart  Recommended low-salt diet for the time being    Health Maintenance:  2019 lab results reviewed with pt.  Immunizations UTD. Vision screening UTD. Dental Screening UTD. Blood pressure is elevated.  Recommend obtaining ambulatory blood pressures 3-4x/week and communicate BP values via MyChart correspondence in 2-3 months.      Immunization History   Administered Date(s) Administered    COVID-19 Ad26 vaccine 18 years and above (Janssen) 0.5 mL 10/07/2019    Hepatitis B (Pediatric/Adolescent) 12/24/1993, 02/02/1994, 07/08/1994    Influenza (Im) Preserved TRIVALENT VACCINE 04/13/2013    Influenza quadrivalent (IM) PF 3 Yrs & greater 05/27/2017    Td 05/12/2001    Tdap 09/01/2012, 12/28/2015     .   No follow-ups on file.       I provided following Counseling/Anticipatory Guidance:    1. Importance of proper nutrition and maintaining healthy weight.       Increase vegetables, fruits, lentils, beans and fiber in your diet.       BMI goal; <30.   Blood Pressure goal: <140/90  2. Importance of aerobic/strengthening  Exercises and prevention of injuries.      Exercise at least 30 minutes 5 days per week.     Always wear your seatbelt in the car.     Wear sunscreen that is broad-spectrum (UVA/UVB) and at least SPF 30.  3. Importance of avoiding misuse of Tobacco, Alcohol, and mood altering drugs.       Avoid heavy drinking ( For Men < 14, and for Male < 7 drinks per week ).  4. Importance of proper dental and mental health.      Schedule dental exam and cleaning every 6 months      Have your vision checked every 1-2 years.  5. Importance of proper Immunization as recommended by ACIP.      Get a flu vaccine yearly in the fall.      Td or Tdap vaccine every 10 years.   6. Importance of proper Screening tests as recommended by USPSTF.      Colonoscopy test:  at least every 10 years starting at age 63.   7. Recommended screening Labs are ordered.    8. Suggest : Annual preventative visit.  This is different and separate from any problem-related visit.   9. Suggest  Website for medical information : [email or web address]   The [facility] website :  [email or web address]        Evlyn Courier, MD

## 2021-10-23 ENCOUNTER — Encounter (INDEPENDENT_AMBULATORY_CARE_PROVIDER_SITE_OTHER): Payer: Self-pay | Admitting: Internal Medicine

## 2021-10-23 ENCOUNTER — Ambulatory Visit (INDEPENDENT_AMBULATORY_CARE_PROVIDER_SITE_OTHER): Payer: No Typology Code available for payment source | Admitting: Internal Medicine

## 2021-10-23 VITALS — BP 123/84 | HR 72 | Temp 98.1°F | Wt 150.8 lb

## 2021-10-23 DIAGNOSIS — Z1211 Encounter for screening for malignant neoplasm of colon: Secondary | ICD-10-CM

## 2021-10-23 DIAGNOSIS — F909 Attention-deficit hyperactivity disorder, unspecified type: Secondary | ICD-10-CM

## 2021-10-23 DIAGNOSIS — G43809 Other migraine, not intractable, without status migrainosus: Secondary | ICD-10-CM

## 2021-10-23 DIAGNOSIS — Z0000 Encounter for general adult medical examination without abnormal findings: Secondary | ICD-10-CM

## 2021-10-23 LAB — CBC AND DIFFERENTIAL
Absolute NRBC: 0 10*3/uL (ref 0.00–0.00)
Basophils Absolute Automated: 0.06 10*3/uL (ref 0.00–0.08)
Basophils Automated: 1.1 %
Eosinophils Absolute Automated: 0.03 10*3/uL (ref 0.00–0.44)
Eosinophils Automated: 0.6 %
Hematocrit: 49.1 % (ref 37.6–49.6)
Hgb: 16.9 g/dL (ref 12.5–17.1)
Immature Granulocytes Absolute: 0.01 10*3/uL (ref 0.00–0.07)
Immature Granulocytes: 0.2 %
Instrument Absolute Neutrophil Count: 3.23 10*3/uL (ref 1.10–6.33)
Lymphocytes Absolute Automated: 1.66 10*3/uL (ref 0.42–3.22)
Lymphocytes Automated: 30.6 %
MCH: 30.7 pg (ref 25.1–33.5)
MCHC: 34.4 g/dL (ref 31.5–35.8)
MCV: 89.1 fL (ref 78.0–96.0)
MPV: 10.7 fL (ref 8.9–12.5)
Monocytes Absolute Automated: 0.44 10*3/uL (ref 0.21–0.85)
Monocytes: 8.1 %
Neutrophils Absolute: 3.23 10*3/uL (ref 1.10–6.33)
Neutrophils: 59.4 %
Nucleated RBC: 0 /100 WBC (ref 0.0–0.0)
Platelets: 288 10*3/uL (ref 142–346)
RBC: 5.51 10*6/uL (ref 4.20–5.90)
RDW: 11 % (ref 11–15)
WBC: 5.43 10*3/uL (ref 3.10–9.50)

## 2021-10-23 LAB — URINALYSIS, REFLEX TO MICROSCOPIC EXAM IF INDICATED
Bilirubin, UA: NEGATIVE
Blood, UA: NEGATIVE
Glucose, UA: NEGATIVE
Ketones UA: NEGATIVE
Leukocyte Esterase, UA: NEGATIVE
Nitrite, UA: NEGATIVE
Protein, UR: NEGATIVE
Specific Gravity UA: 1.013 (ref 1.001–1.035)
Urine pH: 7 (ref 5.0–8.0)
Urobilinogen, UA: NORMAL mg/dL

## 2021-10-23 LAB — LIPID PANEL
Cholesterol / HDL Ratio: 2.9 Index
Cholesterol: 168 mg/dL (ref 0–199)
HDL: 58 mg/dL (ref 40–9999)
LDL Calculated: 98 mg/dL (ref 0–99)
Triglycerides: 61 mg/dL (ref 34–149)
VLDL Calculated: 12 mg/dL (ref 10–40)

## 2021-10-23 LAB — COMPREHENSIVE METABOLIC PANEL
ALT: 19 U/L (ref 0–55)
AST (SGOT): 16 U/L (ref 5–41)
Albumin/Globulin Ratio: 1.6 (ref 0.9–2.2)
Albumin: 4.5 g/dL (ref 3.5–5.0)
Alkaline Phosphatase: 80 U/L (ref 37–117)
Anion Gap: 8 (ref 5.0–15.0)
BUN: 16 mg/dL (ref 9.0–28.0)
Bilirubin, Total: 0.8 mg/dL (ref 0.2–1.2)
CO2: 27 mEq/L (ref 17–29)
Calcium: 10.3 mg/dL (ref 8.5–10.5)
Chloride: 104 mEq/L (ref 99–111)
Creatinine: 1 mg/dL (ref 0.5–1.5)
Globulin: 2.8 g/dL (ref 2.0–3.6)
Glucose: 97 mg/dL (ref 70–100)
Potassium: 5.2 mEq/L (ref 3.5–5.3)
Protein, Total: 7.3 g/dL (ref 6.0–8.3)
Sodium: 139 mEq/L (ref 135–145)

## 2021-10-23 LAB — HEMOGLOBIN A1C
Average Estimated Glucose: 93.9 mg/dL
Hemoglobin A1C: 4.9 % (ref 4.6–5.6)

## 2021-10-23 LAB — PSA: Prostate Specific Antigen, Total: 0.356 ng/mL (ref 0.000–4.000)

## 2021-10-23 LAB — HEPATITIS C ANTIBODY: Hepatitis C, AB: NONREACTIVE

## 2021-10-23 LAB — TSH: TSH: 0.54 u[IU]/mL (ref 0.35–4.94)

## 2021-10-23 LAB — GFR: EGFR: >60

## 2021-10-23 LAB — HEMOLYSIS INDEX: Hemolysis Index: 11 Index (ref 0–24)

## 2021-10-23 NOTE — Progress Notes (Signed)
Have you seen any specialists/other providers since your last visit with [place]?    No      The patient was informed that the following HM items are still outstanding:   Health Maintenance Due   Topic Date Due    Colorectal Cancer Screening  Never done    HEPATITIS C SCREENING  Never done    COVID-19 Vaccine (2 - Booster for Janssen series) 12/02/2019    DEPRESSION SCREENING  05/20/2020

## 2021-11-01 ENCOUNTER — Other Ambulatory Visit (INDEPENDENT_AMBULATORY_CARE_PROVIDER_SITE_OTHER): Payer: Self-pay | Admitting: Student in an Organized Health Care Education/Training Program

## 2021-11-01 ENCOUNTER — Other Ambulatory Visit (INDEPENDENT_AMBULATORY_CARE_PROVIDER_SITE_OTHER): Payer: Self-pay | Admitting: Internal Medicine

## 2021-11-01 DIAGNOSIS — G43909 Migraine, unspecified, not intractable, without status migrainosus: Secondary | ICD-10-CM

## 2021-11-02 ENCOUNTER — Encounter (INDEPENDENT_AMBULATORY_CARE_PROVIDER_SITE_OTHER): Payer: Self-pay

## 2021-11-02 MED ORDER — SUMATRIPTAN SUCCINATE 100 MG PO TABS
100.0000 mg | ORAL_TABLET | ORAL | 11 refills | Status: DC | PRN
Start: 2021-11-02 — End: 2022-01-04

## 2021-11-02 MED ORDER — SUMATRIPTAN SUCCINATE 100 MG PO TABS
100.0000 mg | ORAL_TABLET | Freq: Every day | ORAL | 11 refills | Status: DC | PRN
Start: 2021-11-02 — End: 2022-02-11

## 2021-12-03 ENCOUNTER — Encounter (INDEPENDENT_AMBULATORY_CARE_PROVIDER_SITE_OTHER): Payer: Self-pay

## 2022-01-02 ENCOUNTER — Encounter (INDEPENDENT_AMBULATORY_CARE_PROVIDER_SITE_OTHER): Payer: Self-pay

## 2022-01-04 ENCOUNTER — Other Ambulatory Visit (INDEPENDENT_AMBULATORY_CARE_PROVIDER_SITE_OTHER): Payer: Self-pay | Admitting: Student in an Organized Health Care Education/Training Program

## 2022-01-04 MED ORDER — SUMATRIPTAN SUCCINATE 100 MG PO TABS
100.0000 mg | ORAL_TABLET | ORAL | 11 refills | Status: DC | PRN
Start: 2022-01-04 — End: 2022-02-18

## 2022-02-02 ENCOUNTER — Encounter (INDEPENDENT_AMBULATORY_CARE_PROVIDER_SITE_OTHER): Payer: Self-pay

## 2022-02-11 ENCOUNTER — Encounter: Payer: Self-pay | Admitting: No Specialty

## 2022-02-11 ENCOUNTER — Ambulatory Visit: Payer: No Typology Code available for payment source | Attending: No Specialty | Admitting: No Specialty

## 2022-02-11 VITALS — Ht 67.0 in | Wt 150.0 lb

## 2022-02-11 DIAGNOSIS — Z79899 Other long term (current) drug therapy: Secondary | ICD-10-CM

## 2022-02-11 DIAGNOSIS — G43009 Migraine without aura, not intractable, without status migrainosus: Secondary | ICD-10-CM

## 2022-02-11 MED ORDER — PROPRANOLOL HCL ER 60 MG PO CP24
60.0000 mg | ORAL_CAPSULE | Freq: Every day | ORAL | 1 refills | Status: DC
Start: 2022-02-11 — End: 2022-08-06

## 2022-02-11 NOTE — Patient Instructions (Signed)
Our plan:     Follow the SEEDS for success in headache management, including Sleep hygiene, Exercising regularly, Eating healthy and regular meals, Drinking water, keeping a headache Diary, and Stress reduction.  Limit acute medication use to no more than 3 days per week     Start propranolol to prevent headaches    Continue sumatriptan  Take 1 tab at headache onset. May repeat once as needed in 2 hours.Do not take more than 2 tabs in 24 hours.Do not take more than 3 time a week         Today's Visit:      In today's visit, I reviewed your medications and records relating your health - prior testing, blood work, reports of other health care providers present in your electronic medical record.     If you have pertinent records from any non-[facility] doctors that you would like to review, please have them sent to [place] or bring to them to your next office visit.     A copy of today's visit will be sent to your referring doctor and/or primary care doctor, if you have one listed in our system.    Let me know if there are things we could have done better for your office visit.    Patient satisfaction survey:      If you receive a patient satisfaction survey, I would greatly appreciate it if you would complete it. We value your feedback.     Contact me online:      Patient Portal online - Please sign up for MyChart -- this is the best way to communicate with your team here.  There is a messaging feature, where you can [place] messages anytime of day.  It is the best way to communicate with [place] and get test results, medication refills, or ask questions.     You can expect to get a response within 24-48 hours during weekdays.  If you do not receive a timely response, resend your request and inform us. My goal is for every question answered ever day.  Average response for a phone call maybe 3 days due to the volume we receive (which is why MyChart is preferred).    If you are having a medical emergency -- call 911, DO NOT SEND A MESSAGE  THROUGH MYCHART.     Coupons for medication:      If you have any trouble affording your medications, check out www.goodrx.com for coupons and competitive prices in your area.  If you need further assistance, let us know so we can work with you and your insurance to make sure you get the best care.    Thank you for trusting me with your health.      Take care,  Fraser Din, MD  [facility]   [facility]: [phone]  [place]: [phone]

## 2022-02-11 NOTE — Progress Notes (Signed)
02/11/2022    Verbal consent has been obtained from the patient to conduct a telemedicine visit encounter to minimize exposure to COVID-19: yes.    A two-way, synchronous, real- time, audio and visual interactive communication system between the patient and myself was utilized.     CC: migraine    History was obtained from patient   History of Present Illness:  Christopher Trevino is a 46 y.o. male who p/w migraine.     Headache hx: since 1996  Location:R sided  Described as throbbing, sharp  Occurs:2-3 per week  Duration:>4 hours  Intensity: moderate to severe  Aura:none  Associated with photo/phonophobia, nausea  Denies focal motor/sensory or visual changes.   No positional or autonomic symptoms  Exacerbating factors:exercise, EtOH, weather changes  Alleviating factors:Imitrex  Sleep:OK  Mood:OK    Previous Preventive Medications:  Gabapentin: ineffective    Previous Abortive Medications:  Sumatriptan: effective, takes 2 hours to work      HIT6:60    Records Reviewed:  Dr. Delane GingerGill notes, May 2022        Past Medical History:  Past Medical History:   Diagnosis Date    Attention deficit hyperactivity disorder (ADHD)     History of migraine     LAST MIGRANE Aug 17 2017  EVEN 1 BEER OR GLASS OF WINE BRINGS IT ON       Past Surgical History:  Past Surgical History:   Procedure Laterality Date    INCISION & DRAINAGE, RETROPHARYNGEAL ABSCESS Left 07/01/2017    Procedure: INCISION & DRAINAGE, RETROPHARYNGEAL ABSCESS;  Surgeon: Francis GainesAzadarmaki, Roya, MD;  Location: [facility];  Service: ENT;  Laterality: Left;    NASAL SEPTUM SURGERY  AGE 26    TONSILLECTOMY N/A 08/31/2017    Procedure: TONSILLECTOMY AND EXCISION OF FRENULUM LESION;  Surgeon: Francis GainesAzadarmaki, Roya, MD;  Location: [facility];  Service: ENT;  Laterality: N/A;       Allergies:  Patient has no known allergies.    Family History: Mom with migraines  Family History   Problem Relation Age of Onset    Hyperlipidemia Mother     Hypertension Mother     Hyperlipidemia Father      Hypertension Father     No known problems Brother      No other family history related to current complaints.    Social History:  Social History     Tobacco Use    Smoking status: Former     Packs/day: 0.50     Years: 3.00     Additional pack years: 0.00     Total pack years: 1.50     Types: Cigarettes     Quit date: 05/10/1999     Years since quitting: 22.7    Smokeless tobacco: Never   Vaping Use    Vaping Use: Never used   Substance Use Topics    Alcohol use: Yes     Alcohol/week: 0.0 - 2.0 standard drinks of alcohol     Comment: USED TO DO 6 BEE A WK DECREASED Jun 24 2017    Drug use: No       Medications:  Current Outpatient Medications   Medication Sig Dispense Refill    SUMAtriptan (IMITREX) 100 MG tablet Take 1 tablet (100 mg) by mouth every 2 (two) hours as needed for Migraine 9 tablet 11     No current facility-administered medications for this visit.       General Exam:  Ht 1.702 m (5'  7")   Wt 68 kg (150 lb)   BMI 23.49 kg/m   Respiratory rate wnl.   Gen:  Well-developed, well-nourished.  No acute distress.  Cooperative with exam.  HEENT:  NC/AT. No icterus or conjunctival hemorrhage noted.  Neck: Full range of motion.    Lungs: No audible wheezing or dyspnea at rest.   Skin:  No obvious rashes or lesions.   Extrem:  No obvious edema noted in hands.     NEUROLOGICAL EXAM    Mental Status: Awake, alert, oriented. Speech fluent without dysarthria. Follows commands well. Attention, memory, and concentration intact. Fund of knowledge appropriate for education.     Cranial Nerves:  Extraocular movements are full. No nystagmus seen. Face is symmetric. Hearing is intact to conversational speech. Cranial nerves II-XII otherwise appears intact.     Motor: Moves all extremities well. No obvious focal weakness noted. Bulk appears normal. No tremor noted.     Coordination:  No truncal ataxia.        Investigations:  Labs:  GFR>60, normal    Imaging:  IMRI BRAIN AND IAC WITHOUT AND WITH CONTRAST     HISTORY:  Hearing loss.     TECHNIQUE: Multiplanar MR imaging of the brain with attention to the  internal auditory canals was performed on a 3.0 Tesla MRI scanner. Images  were acquired prior to and following the intravenous administration of 6.5  cc of Gadavist.     FINDINGS: The internal auditory canals, seventh and eighth cranial nerves  appear unremarkable. No abnormal mass or enhancement is seen within the  internal auditory canals. No cerebellopontine angle masses are seen.     In the remainder of the brain, the cerebral parenchyma demonstrates normal  signal characteristics. There is no restricted diffusion to suggest acute  infarction. There is no evidence of acute intracranial hemorrhage. The  ventricles are normal without hydrocephalus. There is no mass effect or  midline shift. No extra-axial collections are seen. No areas of abnormal  enhancement are seen within the brain on the postcontrast images. The major  intracranial flow voids are preserved. The orbits appear unremarkable. The  visualized portions of the paranasal sinuses are clear.     IMPRESSION:    No acute intracranial abnormality. Normal MR examination of the  internal auditory canals and cerebellopontine angles without evidence of  acoustic schwannoma.     Electronically signed by: Georgann Housekeeper M.D.  [Interpreted at: [facility], [place] [facility] [place]     RC: 03/24/16        Recommendations:  1. Migraine without aura and without status migrainosus, not intractable    2. Medication management    Christopher Trevino is a 46 y.o. male who presents for episodic migraine without aura.     MRI brain from 2017 reported as normal    Plan:   Current frequency warrants a preventive. Given exertional trigger, will start propranolol 60mg . We did discuss possibility of reduced exercise tolerance.     Continue Sumatriptan 100mg  PRN abortive    If no improvement, will consider transition to CGRP MAB inhibitors.     F/up in 2-3 months with HA update    42   minutes were spent on the day of service including face to face time with patient, coordinating care, record review, and documentation.     Referring (see communications section)      The patient should seek emergent care if there is any change in the symptoms. Proper use  and all side effects of medications discussed    Discussed the importance of sleep hygiene, maintaining appropriate hydration, avoid overuse of caffeine and OTC medications for headache lifestyle modification      Please contact me with any questions. Patients and [facility] Providers can reach me via MyChart.    Fraser Din, MD  [facility]  [place]: [phone]  [place]: [phone]    *DISCLAIMER: This note was generated by the Epic EMR system/ Dragon speech recognition and may contain inherent errors or omissions not intended by the user. Grammatical errors, random word insertions, deletions, pronoun errors and incomplete sentences are occasional consequences of this technology due to software limitations. Not all errors are caught or corrected. If there are questions or concerns about the content of this note or information contained within the body of this dictation they should be addressed directly with the author for clarification.*

## 2022-02-18 ENCOUNTER — Other Ambulatory Visit (INDEPENDENT_AMBULATORY_CARE_PROVIDER_SITE_OTHER): Payer: Self-pay | Admitting: Student in an Organized Health Care Education/Training Program

## 2022-02-19 MED ORDER — SUMATRIPTAN SUCCINATE 100 MG PO TABS
100.0000 mg | ORAL_TABLET | ORAL | 11 refills | Status: DC | PRN
Start: 2022-02-19 — End: 2022-05-03

## 2022-03-05 ENCOUNTER — Encounter (INDEPENDENT_AMBULATORY_CARE_PROVIDER_SITE_OTHER): Payer: Self-pay

## 2022-04-04 ENCOUNTER — Encounter (INDEPENDENT_AMBULATORY_CARE_PROVIDER_SITE_OTHER): Payer: Self-pay

## 2022-04-22 ENCOUNTER — Encounter: Payer: Self-pay | Admitting: No Specialty

## 2022-04-22 ENCOUNTER — Ambulatory Visit: Payer: No Typology Code available for payment source | Attending: No Specialty | Admitting: No Specialty

## 2022-04-22 VITALS — Ht 67.0 in | Wt 150.0 lb

## 2022-04-22 DIAGNOSIS — T887XXA Unspecified adverse effect of drug or medicament, initial encounter: Secondary | ICD-10-CM

## 2022-04-22 DIAGNOSIS — G43009 Migraine without aura, not intractable, without status migrainosus: Secondary | ICD-10-CM

## 2022-04-22 DIAGNOSIS — Z79899 Other long term (current) drug therapy: Secondary | ICD-10-CM

## 2022-04-22 NOTE — Patient Instructions (Signed)
Our plan:     Continue current medications    F/up in 6-12 months      Today's Visit:      In today's visit, I reviewed your medications and records relating your health - prior testing, blood work, reports of other health care providers present in your electronic medical record.     If you have pertinent records from any non-[place] doctors that you would like to review, please have them sent to us or bring to them to your next office visit.     A copy of today's visit will be sent to your referring doctor and/or primary care doctor, if you have one listed in our system.    Let me know if there are things we could have done better for your office visit.    Patient satisfaction survey:      If you receive a patient satisfaction survey, I would greatly appreciate it if you would complete it. We value your feedback.     Contact me online:      Patient Portal online - Please sign up for MyChart -- this is the best way to communicate with your team here.  There is a messaging feature, where you can us messages anytime of day.  It is the best way to communicate with us and get test results, medication refills, or ask questions.     You can expect to get a response within 24-48 hours during weekdays.  If you do not receive a timely response, resend your request and inform us. My goal is for every question answered ever day.  Average response for a phone call maybe 3 days due to the volume we receive (which is why MyChart is preferred).    If you are having a medical emergency -- call 911, DO NOT SEND A MESSAGE THROUGH MYCHART.     Coupons for medication:      If you have any trouble affording your medications, check out www.goodrx.com for coupons and competitive prices in your area.  If you need further assistance, let us know so we can work with you and your insurance to make sure you get the best care.    Thank you for trusting me with your health.      Take care,  Michah Minton, MD  [facility]    [facility]: [phone]  [place]: [phone]

## 2022-04-22 NOTE — Progress Notes (Signed)
04/22/2022    Verbal consent has been obtained from the patient to conduct a telemedicine visit encounter to minimize exposure to COVID-19: yes.    A two-way, synchronous, real- time, audio and visual interactive communication system between the patient and myself was utilized.     CC: migraine    History was obtained from patient   History of Present Illness:  Christopher Trevino is a 46 y.o. male who p/w migraine.     Oct 2023:   After starting propranolol, did experience some exercise intolerance initially. Seemd to adjust and now can exercise closer to baseline.  He reports resolution of migraines over the past 1 month. Breakthrough migraines are less intense as well.   He has only used sumatriptan 3 times since our last visit.       Aug 2023  Headache hx: since 1996  Location:R sided  Described as throbbing, sharp  Occurs:2-3 per week  Duration:>4 hours  Intensity: moderate to severe  Aura:none  Associated with photo/phonophobia, nausea  Denies focal motor/sensory or visual changes.   No positional or autonomic symptoms  Exacerbating factors:exercise, EtOH, weather changes  Alleviating factors:Imitrex  Sleep:OK  Mood:OK    Previous Preventive Medications:  Gabapentin: ineffective  Propranolol: exercise intolerance initially, now tolerating well and effective    Previous Abortive Medications:  Sumatriptan: effective, takes 2 hours to work      Records Reviewed:  Dr. Delane Ginger notes, May 2022        Past Medical History:  Past Medical History:   Diagnosis Date    Attention deficit hyperactivity disorder (ADHD)     History of migraine     LAST MIGRANE Aug 17 2017  EVEN 1 BEER OR GLASS OF WINE BRINGS IT ON       Past Surgical History:  Past Surgical History:   Procedure Laterality Date    INCISION & DRAINAGE, RETROPHARYNGEAL ABSCESS Left 07/01/2017    Procedure: INCISION & DRAINAGE, RETROPHARYNGEAL ABSCESS;  Surgeon: Francis Gaines, MD;  Location: [facility] [facility];  Service: ENT;  Laterality: Left;    NASAL SEPTUM SURGERY   AGE 25    TONSILLECTOMY N/A 08/31/2017    Procedure: TONSILLECTOMY AND EXCISION OF FRENULUM LESION;  Surgeon: Francis Gaines, MD;  Location: [place] [facility];  Service: ENT;  Laterality: N/A;       Allergies:  Patient has no known allergies.    Family History: Mom with migraines  Family History   Problem Relation Age of Onset    Hyperlipidemia Mother     Hypertension Mother     Hyperlipidemia Father     Hypertension Father     No known problems Brother      No other family history related to current complaints.    Social History:  Social History     Tobacco Use    Smoking status: Former     Packs/day: 0.50     Years: 3.00     Additional pack years: 0.00     Total pack years: 1.50     Types: Cigarettes     Quit date: 05/10/1999     Years since quitting: 22.9    Smokeless tobacco: Never   Vaping Use    Vaping Use: Never used   Substance Use Topics    Alcohol use: Yes     Alcohol/week: 0.0 - 2.0 standard drinks of alcohol     Comment: USED TO DO 6 BEE A WK DECREASED Jun 24 2017    Drug  use: No       Medications:  Current Outpatient Medications   Medication Sig Dispense Refill    propranolol (INDERAL LA) 60 MG 24 hr capsule Take 1 capsule (60 mg) by mouth daily 90 capsule 1    SUMAtriptan (IMITREX) 100 MG tablet Take 1 tablet (100 mg) by mouth every 2 (two) hours as needed for Migraine 9 tablet 11     No current facility-administered medications for this visit.       General Exam:  Ht 1.702 m (5\' 7" )   Wt 68 kg (150 lb)   BMI 23.49 kg/m   Respiratory rate wnl.   Gen:  Well-developed, well-nourished.  No acute distress.  Cooperative with exam.  HEENT:  NC/AT. No icterus or conjunctival hemorrhage noted.  Neck: Full range of motion.    Lungs: No audible wheezing or dyspnea at rest.   Skin:  No obvious rashes or lesions.   Extrem:  No obvious edema noted in hands.     NEUROLOGICAL EXAM    Mental Status: Awake, alert, oriented. Speech fluent without dysarthria. Follows commands well. Attention, memory, and concentration  intact. Fund of knowledge appropriate for education.     Cranial Nerves:  Extraocular movements are full. No nystagmus seen. Face is symmetric. Hearing is intact to conversational speech. Cranial nerves II-XII otherwise appears intact.     Motor: Moves all extremities well. No obvious focal weakness noted. Bulk appears normal. No tremor noted.     Coordination:  No truncal ataxia.        Investigations:  Labs:  GFR>60, normal    Imaging:  IMRI BRAIN AND IAC WITHOUT AND WITH CONTRAST     HISTORY: Hearing loss.     TECHNIQUE: Multiplanar MR imaging of the brain with attention to the  internal auditory canals was performed on a 3.0 Tesla MRI scanner. Images  were acquired prior to and following the intravenous administration of 6.5  cc of Gadavist.     FINDINGS: The internal auditory canals, seventh and eighth cranial nerves  appear unremarkable. No abnormal mass or enhancement is seen within the  internal auditory canals. No cerebellopontine angle masses are seen.     In the remainder of the brain, the cerebral parenchyma demonstrates normal  signal characteristics. There is no restricted diffusion to suggest acute  infarction. There is no evidence of acute intracranial hemorrhage. The  ventricles are normal without hydrocephalus. There is no mass effect or  midline shift. No extra-axial collections are seen. No areas of abnormal  enhancement are seen within the brain on the postcontrast images. The major  intracranial flow voids are preserved. The orbits appear unremarkable. The  visualized portions of the paranasal sinuses are clear.     IMPRESSION:    No acute intracranial abnormality. Normal MR examination of the  internal auditory canals and cerebellopontine angles without evidence of  acoustic schwannoma.     Electronically signed by: Georgann Housekeeper M.D.  [Interpreted at: [facility], [facility]     RC: 03/24/16        Recommendations:  1. Migraine without aura and without status  migrainosus, not intractable    2. Medication side effect    3. Medication management      Christopher Trevino is a 46 y.o. male who presents for episodic migraine without aura.   Headaches improved. Tolerating propranolol better.     Plan:   Continue propranolol 60 mg daily for migraine prevention  Continue Sumatriptan 100mg  PRN abortive      F/up in 6-12 months with HA update    30 minutes were spent on the day of service including face to face time with patient, coordinating care, record review, and documentation.     Referring (see communications section)      The patient should seek emergent care if there is any change in the symptoms. Proper use and all side effects of medications discussed    Discussed the importance of sleep hygiene, maintaining appropriate hydration, avoid overuse of caffeine and OTC medications for headache lifestyle modification      Please contact me with any questions. Patients and [facility] Providers can reach me via MyChart.    Fraser Din, MD  [facility]  [place]: [phone]  Mackie Pai: [phone]    *DISCLAIMER: This note was generated by the Epic EMR system/ Dragon speech recognition and may contain inherent errors or omissions not intended by the user. Grammatical errors, random word insertions, deletions, pronoun errors and incomplete sentences are occasional consequences of this technology due to software limitations. Not all errors are caught or corrected. If there are questions or concerns about the content of this note or information contained within the body of this dictation they should be addressed directly with the author for clarification.*

## 2022-05-03 ENCOUNTER — Other Ambulatory Visit (INDEPENDENT_AMBULATORY_CARE_PROVIDER_SITE_OTHER): Payer: Self-pay | Admitting: Student in an Organized Health Care Education/Training Program

## 2022-05-03 ENCOUNTER — Other Ambulatory Visit: Payer: Self-pay | Admitting: No Specialty

## 2022-05-03 MED ORDER — SUMATRIPTAN SUCCINATE 100 MG PO TABS
100.0000 mg | ORAL_TABLET | ORAL | 11 refills | Status: DC | PRN
Start: 2022-05-03 — End: 2022-09-12

## 2022-05-05 ENCOUNTER — Encounter (INDEPENDENT_AMBULATORY_CARE_PROVIDER_SITE_OTHER): Payer: Self-pay

## 2022-06-04 ENCOUNTER — Encounter (INDEPENDENT_AMBULATORY_CARE_PROVIDER_SITE_OTHER): Payer: Self-pay

## 2022-07-03 ENCOUNTER — Other Ambulatory Visit (INDEPENDENT_AMBULATORY_CARE_PROVIDER_SITE_OTHER): Payer: Self-pay | Admitting: Student in an Organized Health Care Education/Training Program

## 2022-07-05 ENCOUNTER — Encounter (INDEPENDENT_AMBULATORY_CARE_PROVIDER_SITE_OTHER): Payer: Self-pay

## 2022-08-05 ENCOUNTER — Encounter (INDEPENDENT_AMBULATORY_CARE_PROVIDER_SITE_OTHER): Payer: Self-pay

## 2022-08-06 ENCOUNTER — Other Ambulatory Visit: Payer: Self-pay | Admitting: No Specialty

## 2022-09-03 ENCOUNTER — Encounter (INDEPENDENT_AMBULATORY_CARE_PROVIDER_SITE_OTHER): Payer: Self-pay

## 2022-09-12 ENCOUNTER — Other Ambulatory Visit (INDEPENDENT_AMBULATORY_CARE_PROVIDER_SITE_OTHER): Payer: Self-pay | Admitting: Student in an Organized Health Care Education/Training Program

## 2022-09-14 MED ORDER — SUMATRIPTAN SUCCINATE 100 MG PO TABS
100.0000 mg | ORAL_TABLET | ORAL | 11 refills | Status: DC | PRN
Start: 2022-09-14 — End: 2022-11-03

## 2022-10-04 ENCOUNTER — Encounter (INDEPENDENT_AMBULATORY_CARE_PROVIDER_SITE_OTHER): Payer: Self-pay

## 2022-10-28 NOTE — Progress Notes (Signed)
Subjective:      Patient ID: Christopher Trevino is a 47 y.o. male.    Chief Complaint:  Chief Complaint   Patient presents with    Annual Exam     Pt is currently fasting.      HPI   Christopher Trevino, is presenting in clinic today for annual physical examination.  Patient is overall doing well.  Reports frequency and intensity of headaches has improved with propranolol therapy.    Visit Type: Health Maintenance Visit  Work Status: working full-time [employer]  Reported Health: excellent health  Reported Diet: moderate compliance with well-balanced diet  Reported Exercise: regularly, 15-30 minutes/day, Biking, running and walking  Dental: regular dental visits twice a year  Vision: eye exam < 1 year ago  Hearing: normal hearing  Immunization Status: immunizations up to date COVID-19 booster due  Reproductive Health: sexually active  Prior Screening Tests: last colonoscopy in 2019 and 10 years follow-up recommended General Health Risks: no family history of prostate cancer and no family history of colon cancer  Safety Elements Used: uses seat belts, sunscreen use and does not text and drive    Problem List:  Patient Active Problem List   Diagnosis    Peritonsillar abscess    Hyperglycemia    History of migraine    Colitis    Rectal bleeding    Status post tonsillectomy    Attention deficit hyperactivity disorder (ADHD), unspecified ADHD type    Other migraine without status migrainosus, not intractable    Alcohol use, unspecified with unspecified alcohol-induced disorder    Numerous skin moles    Intractable migraine without aura and without status migrainosus       Current Medications:  Outpatient Medications Marked as Taking for the 10/29/22 encounter (Office Visit) with Cooper Moroney, Staci Acosta, MD   Medication Sig Dispense Refill    propranolol (INDERAL LA) 60 MG 24 hr capsule TAKE 1 CAPSULE BY MOUTH DAILY 90 capsule 1    SUMAtriptan (IMITREX) 100 MG tablet Take 1 tablet (100 mg) by mouth every 2 (two) hours as needed for Migraine  9 tablet 11       Allergies:  No Known Allergies    Past Medical History:  Past Medical History:   Diagnosis Date    Attention deficit hyperactivity disorder (ADHD)     History of migraine     LAST MIGRANE Aug 17 2017  EVEN 1 BEER OR GLASS OF WINE BRINGS IT ON       Past Surgical History:  Past Surgical History:   Procedure Laterality Date    INCISION & DRAINAGE, RETROPHARYNGEAL ABSCESS Left 07/01/2017    Procedure: INCISION & DRAINAGE, RETROPHARYNGEAL ABSCESS;  Surgeon: Francis Gaines, MD;  Location: [place] [facility];  Service: ENT;  Laterality: Left;    NASAL SEPTUM SURGERY  AGE 67    TONSILLECTOMY N/A 08/31/2017    Procedure: TONSILLECTOMY AND EXCISION OF FRENULUM LESION;  Surgeon: Francis Gaines, MD;  Location: [place] [facility];  Service: ENT;  Laterality: N/A;       Family History:  Family History   Problem Relation Age of Onset    Hyperlipidemia Mother     Hypertension Mother     Hyperlipidemia Father     Hypertension Father     No known problems Brother        Social History:  Social History     Tobacco Use    Smoking status: Former     Current packs/day: 0.00  Average packs/day: 0.5 packs/day for 3.0 years (1.5 ttl pk-yrs)     Types: Cigarettes     Start date: 05/09/1996     Quit date: 05/10/1999     Years since quitting: 23.4    Smokeless tobacco: Never   Vaping Use    Vaping status: Never Used   Substance Use Topics    Alcohol use: Yes     Alcohol/week: 0.0 - 2.0 standard drinks of alcohol     Comment: USED TO DO 6 BEE A WK DECREASED Jun 24 2017    Drug use: No          The following sections were reviewed this encounter by the provider:   Tobacco  Allergies  Meds  Problems  Med Hx  Surg Hx  Fam Hx           Review of Systems   General/Constitutional:   Denies Change in appetite. Denies Chills. Denies Fatigue. Denies Fever. Denies Weight gain. Denies Weight loss.   Ophthalmologic:   Denies Blurred vision. Denies Diminished visual acuity. Denies Eye Pain.   ENT:   Denies Hearing Loss. Denies Nasal  Discharge. Denies Hoarseness. Denies Ear pain. Denies Nosebleed. Denies Sinus pain. Denies Sore throat. Denies Sneezing.   Endocrine:   Denies Polydipsia. Denies Polyuria.   Cardiovascular:   Denies Chest pain. Denies Chest pain with exertion. Denies Leg Claudication. Denies Palpitations. Denies Swelling in hands/feet.   Respiratory:   Denies Paroxysmal Nocturnal Dyspnea. Denies Cough. Denies Orthopnea. Denies Shortness of breath. Denies Daytime Hypersomnolence. Denies Snoring. Denies Witness Apnea. Denies Wheezing.   Gastrointestinal:   Denies Abdominal pain. Denies Blood in stool. Denies Constipation. Denies Diarrhea. Denies Heartburn. Denies Nausea. Denies Vomiting.   Hematology:   Denies Easy bruising. Denies Easy Bleeding.   Genitourinary:   Denies Blood in urine. Denies Nocturia.   Musculoskeletal:   Denies Joint pain. Denies Leg cramps. Denies Weakness in LE. Denies Swollen joints.   Peripheral Vascular:   Denies Cold extremities. Denies Decreased sensation in extremities. Denies Painful extremities.   Skin:   Denies Itching. Denies Change in Mole(s). Denies Rash.   Neurologic:   Denies Balance difficulty. Denies Dizziness. Denies Headache. Denies Pre-Syncope. Denies Memory loss.   Psychiatric:   Denies Anxiety. Denies Depressed mood. Denies Difficulty sleeping. Denies Mood Swings.    Objective:   Vitals:  BP 128/89 (BP Site: Left arm, Patient Position: Sitting, Cuff Size: Medium)   Pulse (!) 55   Temp 97.9 F (36.6 C) (Temporal)   Ht 1.709 m (5' 7.3")   Wt 69.4 kg (153 lb)   SpO2 97%   BMI 23.75 kg/m     Physical Exam   GENERAL APPEARANCE: alert, in no acute distress, well developed, well nourished, oriented to time, place, and person.   HEAD: normal appearance, atraumatic.   EYES: extraocular movement intact (EOMI), pupils equal, round, reactive to light and accommodation, sclera anicteric, conjunctiva clear.   EARS: tympanic membranes normal bilaterally, external canals normal .   NOSE: normal  nasal mucosa, no lesions.   ORAL CAVITY: normal oropharynx, normal lips, mucosa moist,   THROAT: normal appearance, clear, no erythema.   NECK/THYROID: neck supple, no carotid bruit,  no cervical lymphadenopathy, no neck mass palpated, no jugular venous distention, no thyromegaly.   SKIN: good turgor, no rashes, no suspicious lesions.   HEART: S1, S2 normal, no murmurs, rubs, gallops, regular rate and rhythm.   LUNGS: normal effort / no distress, normal breath sounds, clear to auscultation  bilaterally, no wheezes, rales, rhonchi.   ABDOMEN: bowel sounds present, no hepatosplenomegaly, soft, nontender, nondistended.   MUSCULOSKELETAL: full range of motion, no swelling or deformity.   EXTREMITIES: no edema, no clubbing, cyanosis, or edema.   PERIPHERAL PULSES: 2+ dorsalis pedis, 2+ posterior tibial.   NEUROLOGIC: nonfocal, cranial nerves 2-12 grossly intact, deep tendon reflexes 2+ symmetrical, normal strength, tone and reflexes, sensory exam intact.   PSYCH: cognitive function intact, mood/affect full range, speech clear.    Assessment/Plan:    1. Physical exam  - CBC and differential  - Comprehensive metabolic panel  - TSH  - Lipid panel  - Hemoglobin A1C    2. Migraine without status migrainosus, not intractable, unspecified migraine type    3. Attention deficit hyperactivity disorder (ADHD), unspecified ADHD type     Remains active and in good health    Already had seen neurologist and is doing well on propranolol on frequency and intensity of headaches.  Sumatriptan has been helpful for flareups    History of attention deficit hyperactivity disorder (ADHD), unspecified ADHD type  He has not been taking stimulants and is managing well.      Health Maintenance:  2023 lab results reviewed with pt.  Immunizations UTD. Vision screening UTD. Dental Screening UTD. Colonoscopy is UTD. COVID vaccination recommended to pt.       Cscope Dr Francis Dowse [facility] 11/15/2017 f/u 10 years  Immunization History   Administered Date(s)  Administered    COVID-19 Ad26 vaccine 18 years and above (Janssen) 0.5 mL 10/07/2019    Hepatitis B (pediatric/adolescent) 12/24/1993, 02/02/1994, 07/08/1994    Influenza (Im) Preserved TRIVALENT VACCINE 04/13/2013    Influenza vaccine, quadrivalent, 3 years and older (FLUZONE), single-dose preservative free, 0.5 mL 05/27/2017    Tdap (tetanus, diphtheria reduced, acellular pertussis), adsorbed vaccine 09/01/2012, 12/28/2015    tetanus and diphtheria toxoids, adsorbed, preservative free, for adult use (2 Lf of tetanus toxoid and 2 Lf of diphtheria toxoid) 05/12/2001   He declines COVID boosters  .   Return in about 1 year (around 10/29/2023) for annual check up and earlier as needed.       I provided following Counseling/Anticipatory Guidance:    1. Importance of proper nutrition and maintaining healthy weight.       Increase vegetables, fruits, lentils, beans and fiber in your diet.       BMI goal; <30.   Blood Pressure goal: <140/90  2. Importance of aerobic/strengthening  Exercises and prevention of injuries.      Exercise at least 30 minutes 5 days per week.     Always wear your seatbelt in the car.     Wear sunscreen that is broad-spectrum (UVA/UVB) and at least SPF 30.  3. Importance of avoiding misuse of Tobacco, Alcohol, and mood altering drugs.       Avoid heavy drinking ( For Men < 14, and for Male < 7 drinks per week ).  4. Importance of proper dental and mental health.      Schedule dental exam and cleaning every 6 months      Have your vision checked every 1-2 years.  5. Importance of proper Immunization as recommended by ACIP.      Get a flu vaccine yearly in the fall.      Td or Tdap vaccine every 10 years.   6. Importance of proper Screening tests as recommended by USPSTF.      Colonoscopy test:  at least every 10 years starting at age  50.   7. Recommended screening Labs are ordered.    8. Suggest : Annual preventative visit.  This is different and separate from any problem-related visit.   9.  Suggest  Website for medical information : [email or web address]   [facility] website :   [email or web address]        Len Childs, MD

## 2022-10-29 ENCOUNTER — Encounter (INDEPENDENT_AMBULATORY_CARE_PROVIDER_SITE_OTHER): Payer: Self-pay | Admitting: Internal Medicine

## 2022-10-29 ENCOUNTER — Ambulatory Visit (INDEPENDENT_AMBULATORY_CARE_PROVIDER_SITE_OTHER): Payer: No Typology Code available for payment source | Admitting: Internal Medicine

## 2022-10-29 VITALS — BP 128/89 | HR 55 | Temp 97.9°F | Ht 67.3 in | Wt 153.0 lb

## 2022-10-29 DIAGNOSIS — G43909 Migraine, unspecified, not intractable, without status migrainosus: Secondary | ICD-10-CM

## 2022-10-29 DIAGNOSIS — Z0000 Encounter for general adult medical examination without abnormal findings: Secondary | ICD-10-CM

## 2022-10-29 DIAGNOSIS — F909 Attention-deficit hyperactivity disorder, unspecified type: Secondary | ICD-10-CM

## 2022-10-29 LAB — CBC AND DIFFERENTIAL
Absolute NRBC: 0 10*3/uL (ref 0.00–0.00)
Basophils Absolute Automated: 0.06 10*3/uL (ref 0.00–0.08)
Basophils Automated: 1.1 %
Eosinophils Absolute Automated: 0.12 10*3/uL (ref 0.00–0.44)
Eosinophils Automated: 2.3 %
Hematocrit: 46.1 % (ref 37.6–49.6)
Hgb: 15.8 g/dL (ref 12.5–17.1)
Immature Granulocytes Absolute: 0.02 10*3/uL (ref 0.00–0.07)
Immature Granulocytes: 0.4 %
Instrument Absolute Neutrophil Count: 2.86 10*3/uL (ref 1.10–6.33)
Lymphocytes Absolute Automated: 1.86 10*3/uL (ref 0.42–3.22)
Lymphocytes Automated: 34.9 %
MCH: 31.1 pg (ref 25.1–33.5)
MCHC: 34.3 g/dL (ref 31.5–35.8)
MCV: 90.7 fL (ref 78.0–96.0)
MPV: 11.4 fL (ref 8.9–12.5)
Monocytes Absolute Automated: 0.41 10*3/uL (ref 0.21–0.85)
Monocytes: 7.7 %
Neutrophils Absolute: 2.86 10*3/uL (ref 1.10–6.33)
Neutrophils: 53.6 %
Nucleated RBC: 0 /100 WBC (ref 0.0–0.0)
Platelets: 269 10*3/uL (ref 142–346)
RBC: 5.08 10*6/uL (ref 4.20–5.90)
RDW: 11 % (ref 11–15)
WBC: 5.33 10*3/uL (ref 3.10–9.50)

## 2022-10-29 LAB — URINALYSIS WITH REFLEX TO MICROSCOPIC EXAM IF INDICATED
Bilirubin, UA: NEGATIVE
Blood, UA: NEGATIVE
Glucose, UA: NEGATIVE
Ketones UA: NEGATIVE
Leukocyte Esterase, UA: NEGATIVE
Nitrite, UA: NEGATIVE
Protein, UR: NEGATIVE
Specific Gravity UA: 1.007 (ref 1.001–1.035)
Urine pH: 6.5 (ref 5.0–8.0)
Urobilinogen, UA: NORMAL mg/dL

## 2022-10-29 LAB — COMPREHENSIVE METABOLIC PANEL
ALT: 17 U/L (ref 0–55)
AST (SGOT): 15 U/L (ref 5–41)
Albumin/Globulin Ratio: 1.5 (ref 0.9–2.2)
Albumin: 4.3 g/dL (ref 3.5–5.0)
Alkaline Phosphatase: 73 U/L (ref 37–117)
Anion Gap: 6 (ref 5.0–15.0)
BUN: 20 mg/dL (ref 9.0–28.0)
Bilirubin, Total: 0.9 mg/dL (ref 0.2–1.2)
CO2: 28 mEq/L (ref 17–29)
Calcium: 9.7 mg/dL (ref 8.5–10.5)
Chloride: 105 mEq/L (ref 99–111)
Creatinine: 1.1 mg/dL (ref 0.5–1.5)
Globulin: 2.8 g/dL (ref 2.0–3.6)
Glucose: 105 mg/dL — ABNORMAL HIGH (ref 70–100)
Potassium: 4.3 mEq/L (ref 3.5–5.3)
Protein, Total: 7.1 g/dL (ref 6.0–8.3)
Sodium: 139 mEq/L (ref 135–145)
eGFR: >60 mL/min/{1.73_m2} (ref >=60)

## 2022-10-29 LAB — HEMOLYSIS INDEX(SOFT): Hemolysis Index: 4 Index (ref 0–24)

## 2022-10-29 LAB — LIPID PANEL
Cholesterol / HDL Ratio: 3.4 Index
Cholesterol: 189 mg/dL (ref 0–199)
HDL: 56 mg/dL (ref 40–9999)
LDL Calculated: 114 mg/dL — ABNORMAL HIGH (ref 0–99)
Triglycerides: 93 mg/dL (ref 34–149)
VLDL Calculated: 19 mg/dL (ref 10–40)

## 2022-10-29 LAB — HEMOGLOBIN A1C
Average Estimated Glucose: 102.5 mg/dL
Hemoglobin A1C: 5.2 % (ref 4.6–5.6)

## 2022-10-29 LAB — TSH: TSH: 0.59 u[IU]/mL (ref 0.35–4.94)

## 2022-10-29 NOTE — Progress Notes (Signed)
Have you seen any specialists/other providers since your last visit with us?    No      The patient was informed that the following HM items are still outstanding:   Health Maintenance Due   Topic Date Due    COVID-19 Vaccine (2 - 2023-24 season) 02/26/2022

## 2022-11-03 ENCOUNTER — Other Ambulatory Visit (INDEPENDENT_AMBULATORY_CARE_PROVIDER_SITE_OTHER): Payer: Self-pay | Admitting: Student in an Organized Health Care Education/Training Program

## 2022-11-03 ENCOUNTER — Encounter (INDEPENDENT_AMBULATORY_CARE_PROVIDER_SITE_OTHER): Payer: Self-pay

## 2022-11-04 MED ORDER — SUMATRIPTAN SUCCINATE 100 MG PO TABS
100.0000 mg | ORAL_TABLET | ORAL | 11 refills | Status: DC | PRN
Start: 2022-11-04 — End: 2023-07-19

## 2022-12-04 ENCOUNTER — Encounter (INDEPENDENT_AMBULATORY_CARE_PROVIDER_SITE_OTHER): Payer: Self-pay

## 2022-12-23 ENCOUNTER — Other Ambulatory Visit (INDEPENDENT_AMBULATORY_CARE_PROVIDER_SITE_OTHER): Payer: Self-pay | Admitting: Student in an Organized Health Care Education/Training Program

## 2023-01-03 ENCOUNTER — Encounter (INDEPENDENT_AMBULATORY_CARE_PROVIDER_SITE_OTHER): Payer: Self-pay

## 2023-01-29 ENCOUNTER — Other Ambulatory Visit: Payer: Self-pay | Admitting: No Specialty

## 2023-02-03 ENCOUNTER — Encounter (INDEPENDENT_AMBULATORY_CARE_PROVIDER_SITE_OTHER): Payer: Self-pay

## 2023-03-03 ENCOUNTER — Other Ambulatory Visit (INDEPENDENT_AMBULATORY_CARE_PROVIDER_SITE_OTHER): Payer: Self-pay | Admitting: Student in an Organized Health Care Education/Training Program

## 2023-03-06 ENCOUNTER — Encounter (INDEPENDENT_AMBULATORY_CARE_PROVIDER_SITE_OTHER): Payer: Self-pay

## 2023-04-05 ENCOUNTER — Encounter (INDEPENDENT_AMBULATORY_CARE_PROVIDER_SITE_OTHER): Payer: Self-pay

## 2023-04-18 ENCOUNTER — Encounter (INDEPENDENT_AMBULATORY_CARE_PROVIDER_SITE_OTHER): Payer: Self-pay | Admitting: Internal Medicine

## 2023-04-18 ENCOUNTER — Ambulatory Visit (INDEPENDENT_AMBULATORY_CARE_PROVIDER_SITE_OTHER): Payer: No Typology Code available for payment source | Admitting: Internal Medicine

## 2023-04-18 VITALS — BP 123/79 | Temp 98.0°F | Wt 157.2 lb

## 2023-04-18 DIAGNOSIS — S60552A Superficial foreign body of left hand, initial encounter: Secondary | ICD-10-CM

## 2023-04-18 DIAGNOSIS — Z23 Encounter for immunization: Secondary | ICD-10-CM

## 2023-04-18 NOTE — Progress Notes (Signed)
Subjective:                 Christopher Trevino               Patient ID: Christopher Trevino is a 47 y.o. male     Chief Complaint   Patient presents with    Foreign Body in Skin     C/o on palm of left hand for about 2 weeks.         HPI     Patient like some splinter injury of the left palm of hand about 2 weeks ago.  He has been having pain and was unable to get rid with the use of forceps/tweezers at home.  He is not sure whether he had would splinter or not.     The following sections were reviewed this encounter by the provider:        Review of Systems   Constitutional: Negative for chills, fatigue and fever.   HENT: Negative for rhinorrhea and sore throat or any sinus symptoms  Respiratory: Negative for cough and shortness of breath.    Cardiovascular: Negative for chest pain, palpitations and leg swelling.   Gastrointestinal: Negative for abdominal pain, nausea and vomiting. Declines diarrhea  Genitourinary: Negative for dysuria, frequency and hematuria.   Skin: Negative for rash or any new lesions  Neurological: Negative for light-headedness, numbness and headaches.   Hematological: Negative for adenopathy. Does not bruise/bleed easily.    Objective:   Physical Exam  BP 123/79 (BP Site: Left arm, Cuff Size: Medium)   Temp 98 F (36.7 C)   Wt 71.3 kg (157 lb 3.2 oz)   SpO2 98%   BMI 24.40 kg/m   Wt Readings from Last 6 Encounters:   04/18/23 71.3 kg (157 lb 3.2 oz)   10/29/22 69.4 kg (153 lb)   04/22/22 68 kg (150 lb)   02/11/22 68 kg (150 lb)   10/23/21 68.4 kg (150 lb 12.8 oz)   10/30/20 67.1 kg (148 lb)      BP Readings from Last 3 Encounters:   04/18/23 123/79   10/29/22 128/89   10/23/21 123/84      General: awake, alert X3, No acute distress  Mucosa moist,  No icterus, no pallor, No thyromegaly no JVD  Cardiovascular: regular rate and rhythm, no murmurs, rubs or gallops  Lungs: clear to auscultation bilaterally, without wheezing, rhonchi, or rales  Abdomen: soft, non-tender,  non-distended; no palpable masses,  normoactive bowel sounds  Extremities: no edema, No cyanosis or clubbing  Skin dry and warm  Neuro Grossly non-focal  Assessment/Plan:   1. Splinter of hand, left, initial encounter    2. Flu vaccine need     Left palm of the hand with scabbed round small bump  An cross incision was made at that location under aseptic precautions with the use of povidone iodine  Lidocaine spray was used for anesthesia  Exploration for foreign body done  There was some bleeding but hemostasis was achieved at the end  The wound was explored and no obvious foreign body was seen but patient did feel pain relieved    Advised to use antiseptic cream along with Band-Aid  Apply pressure if bleeding recurs    Could use Tylenol/NSAIDs as needed for pain    If continues to have pain would refer him to hand surgeon for deep exploration but present interventions should be sufficient     Risk & Benefits of the  new medication were explained to the patient who verbalized understanding of potential side effects and agreed to the treatment plan. Patient was encouraged to contact me/clinical staff with any new questions/concerns.     Call if you develop any new or worsening symptoms  Proceed to urgent care or ED after hours if necessary     Return in about 7 months (around 11/16/2023) for annual check up and earlier as needed.  Christopher Childs, MD    Total time spent 31 minutes in medical decision making with this patient today which includes reviewing prior notes, pre and post charting and face to face encounter with evaluation and discussion about diagnosis and treatment options with the patient plus aseptic exploration of the foreign body/splinter of the left palm of the hand.     This note was generated by the Epic EMR system/ Dragon speech recognition and may contain inherent errors or omissions not intended by the user. Grammatical errors, random word insertions, deletions, pronoun errors and incomplete sentences  are occasional consequences of this technology due to software limitations. Not all errors are caught or corrected. If there are questions or concerns about the content of this note or information contained within the body of this dictation they should be addressed directly with the author for clarification.

## 2023-04-18 NOTE — Progress Notes (Signed)
Patient presented to the office for influenza vaccine administration.  Received injection in the Left arm.  No reaction was noted and patient left in good condition.   Have you seen any specialists/other providers since your last visit with [place]?    No      The patient was informed that the following HM items are still outstanding:   Health Maintenance Due   Topic Date Due    INFLUENZA VACCINE  01/27/2023

## 2023-04-19 ENCOUNTER — Ambulatory Visit: Payer: No Typology Code available for payment source | Attending: No Specialty | Admitting: No Specialty

## 2023-04-19 ENCOUNTER — Telehealth: Payer: Self-pay

## 2023-04-19 ENCOUNTER — Encounter: Payer: Self-pay | Admitting: No Specialty

## 2023-04-19 VITALS — Ht 67.0 in | Wt 152.0 lb

## 2023-04-19 DIAGNOSIS — G43009 Migraine without aura, not intractable, without status migrainosus: Secondary | ICD-10-CM

## 2023-04-19 DIAGNOSIS — Z79899 Other long term (current) drug therapy: Secondary | ICD-10-CM

## 2023-04-19 MED ORDER — GALCANEZUMAB-GNLM 120 MG/ML SC SOAJ
SUBCUTANEOUS | 11 refills | Status: DC
Start: 2023-04-19 — End: 2023-07-19

## 2023-04-19 MED ORDER — PROPRANOLOL HCL ER 60 MG PO CP24
60.0000 mg | ORAL_CAPSULE | Freq: Every day | ORAL | 0 refills | Status: AC
Start: 2023-04-19 — End: ?

## 2023-04-19 NOTE — Telephone Encounter (Signed)
Medication: Emgality 120mg     status:PA submitted    (Key: [id number])

## 2023-04-19 NOTE — Patient Instructions (Signed)
Our plan:     Start Emgality    Continue propranolol        Today's Visit:      In today's visit, I reviewed your medications and records relating your health - prior testing, blood work, reports of other health care providers present in your electronic medical record.     If you have pertinent records from any non-[place] doctors that you would like to review, please have them sent to [place] or bring to them to your next office visit.     A copy of today's visit will be sent to your referring doctor and/or primary care doctor, if you have one listed in our system.    Let me know if there are things we could have done better for your office visit.    Patient satisfaction survey:      If you receive a patient satisfaction survey, I would greatly appreciate it if you would complete it. We value your feedback.     Contact me online:      Patient Portal online - Please sign up for MyChart -- this is the best way to communicate with your team here.  There is a messaging feature, where you can [place] messages anytime of day.  It is the best way to communicate with [place] and get test results, medication refills, or ask questions.     You can expect to get a response within 24-48 hours during weekdays.  If you do not receive a timely response, resend your request and inform us. My goal is for every question answered ever day.  Average response for a phone call maybe 3 days due to the volume we receive (which is why MyChart is preferred).    If you are having a medical emergency -- call 911, DO NOT SEND A MESSAGE THROUGH MYCHART.     Coupons for medication:      If you have any trouble affording your medications, check out [email or web address] for coupons and competitive prices in your area.  If you need further assistance, let us know so we can work with you and your insurance to make sure you get the best care.    Thank you for trusting me with your health.      Take care,  Fraser Din, MD  [facility]   [facility]: [phone]  [place]: [phone]

## 2023-04-19 NOTE — Progress Notes (Signed)
04/19/2023    Verbal consent has been obtained from the patient to conduct a telemedicine visit encounter to minimize exposure to COVID-19: yes.    A two-way, synchronous, real- time, audio and visual interactive communication system between the patient and myself was utilized.     CC: migraine    History was obtained from patient   History of Present Illness:  Christopher Trevino is a 47 y.o. male who p/w migraine.     Oct 2024:   He presents for f/up.   He reports some improvement in migraine with propranolol. Frequency decreased to 2-4 times per month. Then over the summer headaches increased in frequency.   He estimates the headache occur twice per week.   Storms remain a trigger. As does EtOH.   Migraines can be disabling for him.   Sumatriptan 100mg  is effective.     Oct 2023:   After starting propranolol, did experience some exercise intolerance initially. Seemd to adjust and now can exercise closer to baseline.  He reports resolution of migraines over the past 1 month. Breakthrough migraines are less intense as well.   He has only used sumatriptan 3 times since our last visit.       Aug 2023  Headache hx: since 1996  Location:R sided  Described as throbbing, sharp  Occurs:2-3 per week  Duration:>4 hours  Intensity: moderate to severe  Aura:none  Associated with photo/phonophobia, nausea  Denies focal motor/sensory or visual changes.   No positional or autonomic symptoms  Exacerbating factors:exercise, EtOH, weather changes  Alleviating factors:Imitrex  Sleep:OK  Mood:OK    Previous Preventive Medications:  Gabapentin: ineffective  Propranolol: ineffective    Previous Abortive Medications:  Sumatriptan: effective, takes 2 hours to work    Records Reviewed:  Dr. Delane Ginger notes, May 2022    Past Medical History:  Past Medical History:   Diagnosis Date    Attention deficit hyperactivity disorder (ADHD)     History of migraine     LAST MIGRANE Aug 17 2017  EVEN 1 BEER OR GLASS OF WINE BRINGS IT ON       Past Surgical  History:  Past Surgical History:   Procedure Laterality Date    INCISION & DRAINAGE, RETROPHARYNGEAL ABSCESS Left 07/01/2017    Procedure: INCISION & DRAINAGE, RETROPHARYNGEAL ABSCESS;  Surgeon: Francis Gaines, MD;  Location: [place] [facility] OR;  Service: ENT;  Laterality: Left;    NASAL SEPTUM SURGERY  AGE 89    TONSILLECTOMY N/A 08/31/2017    Procedure: TONSILLECTOMY AND EXCISION OF FRENULUM LESION;  Surgeon: Francis Gaines, MD;  Location: [place] [facility] OR;  Service: ENT;  Laterality: N/A;       Allergies:  Patient has no known allergies.    Family History: Mom with migraines  Family History   Problem Relation Age of Onset    Hyperlipidemia Mother     Hypertension Mother     Hyperlipidemia Father     Hypertension Father     No known problems Brother      No other family history related to current complaints.    Social History:  Social History     Tobacco Use    Smoking status: Former     Current packs/day: 0.00     Average packs/day: 0.5 packs/day for 3.0 years (1.5 ttl pk-yrs)     Types: Cigarettes     Start date: 05/09/1996     Quit date: 05/10/1999     Years since quitting: 23.9  Smokeless tobacco: Never   Vaping Use    Vaping status: Never Used   Substance Use Topics    Alcohol use: Yes     Alcohol/week: 0.0 - 2.0 standard drinks of alcohol     Comment: weekly    Drug use: No       Medications:  Current Outpatient Medications   Medication Sig Dispense Refill    propranolol (INDERAL LA) 60 MG 24 hr capsule TAKE 1 CAPSULE BY MOUTH DAILY 90 capsule 0    SUMAtriptan (IMITREX) 100 MG tablet Take 1 tablet (100 mg) by mouth every 2 (two) hours as needed for Migraine 9 tablet 11     No current facility-administered medications for this visit.       General Exam:  Ht 1.702 m (5\' 7" )   Wt 68.9 kg (152 lb)   BMI 23.81 kg/m   Respiratory rate wnl.   Gen:  Well-developed, well-nourished.  No acute distress.  Cooperative with exam.  HEENT:  NC/AT. No icterus or conjunctival hemorrhage noted.  Neck: Full range of motion.     Lungs: No audible wheezing or dyspnea at rest.   Skin:  No obvious rashes or lesions.   Extrem:  No obvious edema noted in hands.     NEUROLOGICAL EXAM    Mental Status: Awake, alert, oriented. Speech fluent without dysarthria. Follows commands well. Attention, memory, and concentration intact. Fund of knowledge appropriate for education.     Cranial Nerves:  Extraocular movements are full. No nystagmus seen. Face is symmetric. Hearing is intact to conversational speech. Cranial nerves II-XII otherwise appears intact.     Motor: Moves all extremities well. No obvious focal weakness noted. Bulk appears normal. No tremor noted.     Coordination:  No truncal ataxia.        Investigations:  Labs:  GFR>60, normal    Imaging:  IMRI BRAIN AND IAC WITHOUT AND WITH CONTRAST     HISTORY: Hearing loss.     TECHNIQUE: Multiplanar MR imaging of the brain with attention to the  internal auditory canals was performed on a 3.0 Tesla MRI scanner. Images  were acquired prior to and following the intravenous administration of 6.5  cc of Gadavist.     FINDINGS: The internal auditory canals, seventh and eighth cranial nerves  appear unremarkable. No abnormal mass or enhancement is seen within the  internal auditory canals. No cerebellopontine angle masses are seen.     In the remainder of the brain, the cerebral parenchyma demonstrates normal  signal characteristics. There is no restricted diffusion to suggest acute  infarction. There is no evidence of acute intracranial hemorrhage. The  ventricles are normal without hydrocephalus. There is no mass effect or  midline shift. No extra-axial collections are seen. No areas of abnormal  enhancement are seen within the brain on the postcontrast images. The major  intracranial flow voids are preserved. The orbits appear unremarkable. The  visualized portions of the paranasal sinuses are clear.     IMPRESSION:    No acute intracranial abnormality. Normal MR examination of the  internal  auditory canals and cerebellopontine angles without evidence of  acoustic schwannoma.     Electronically signed by: Georgann Housekeeper M.D.  [Interpreted at: [facility], [facility]     RC: 03/24/16        Recommendations:  1. Migraine without aura and without status migrainosus, not intractable  - Galcanezumab-gnlm 120 MG/ML Solution Auto-injector; Inject 2 .pens into the skin  every 30 (thirty) days for 30 days, THEN 1 .pen every 30 (thirty) days.  Dispense: 2 each; Refill: 11  - propranolol (INDERAL LA) 60 MG 24 hr capsule; Take 1 capsule (60 mg) by mouth daily  Dispense: 90 capsule; Refill: 0    2. Medication management  - Galcanezumab-gnlm 120 MG/ML Solution Auto-injector; Inject 2 .pens into the skin every 30 (thirty) days for 30 days, THEN 1 .pen every 30 (thirty) days.  Dispense: 2 each; Refill: 11  - propranolol (INDERAL LA) 60 MG 24 hr capsule; Take 1 capsule (60 mg) by mouth daily  Dispense: 90 capsule; Refill: 0        Christopher Trevino is a 47 y.o. male who presents for episodic migraine without aura.   Migraine is worsening in frequency.     Plan:   Start Emgality for [place] prevention    Continue propranolol 60 mg daily for migraine prevention. If he shows imporveemnt with Emgality will taper at next visit.     Continue Sumatriptan 100mg  PRN abortive      F/up in 3  months with HA update    40  minutes were spent on the day of service including face to face time with patient, coordinating care, record review, and documentation. Patient has a chronic neurologic condition which will require continued longitudinal follow-up to evaluate and treat it.    Referring (see communications section)      The patient should seek emergent care if there is any change in the symptoms. Proper use and all side effects of medications discussed    Discussed the importance of sleep hygiene, maintaining appropriate hydration, avoid overuse of caffeine and OTC medications for headache lifestyle  modification      Please contact me with any questions. Patients and [place] Providers can reach me via MyChart.    Fraser Din, MD  [facility]  : [phone]  Mackie Pai: [phone]    *DISCLAIMER: This note was generated by the Epic EMR system/ Dragon speech recognition and may contain inherent errors or omissions not intended by the user. Grammatical errors, random word insertions, deletions, pronoun errors and incomplete sentences are occasional consequences of this technology due to software limitations. Not all errors are caught or corrected. If there are questions or concerns about the content of this note or information contained within the body of this dictation they should be addressed directly with the author for clarification.*

## 2023-04-20 ENCOUNTER — Encounter: Payer: Self-pay | Admitting: No Specialty

## 2023-04-20 ENCOUNTER — Other Ambulatory Visit: Payer: Self-pay | Admitting: No Specialty

## 2023-04-20 NOTE — Telephone Encounter (Signed)
Medication: Emgality 120mg  pen    status: PA denied    Insurance: [employer]    Reason: This medicine is covered only if:     You have failed (after a trial of at least two months) or cannot use one of the following preventive therapies (document name and date tried):     -Amitriptyline  -Candesartan  -Divalproex sodium (Depakote/Depakote ER)   -Botox  -Topiramate  -Venlafaxine    Please advise

## 2023-04-21 NOTE — Telephone Encounter (Signed)
Medication: Emgality 120mg     status: submitted new PA through [facility]     (Key: [id number])

## 2023-04-22 NOTE — Telephone Encounter (Signed)
Medication: Emgality 120mg  pen    status: PA approved     Insurance: [employer]    PA on file from 04/21/2023 to 04/20/2024    2/30 days $35.00 co pay    pt can fill at [facility]    eligible for savings card

## 2023-05-06 ENCOUNTER — Encounter (INDEPENDENT_AMBULATORY_CARE_PROVIDER_SITE_OTHER): Payer: Self-pay

## 2023-05-31 ENCOUNTER — Encounter: Payer: Self-pay | Admitting: No Specialty

## 2023-06-05 ENCOUNTER — Encounter (INDEPENDENT_AMBULATORY_CARE_PROVIDER_SITE_OTHER): Payer: Self-pay

## 2023-07-06 ENCOUNTER — Encounter (INDEPENDENT_AMBULATORY_CARE_PROVIDER_SITE_OTHER): Payer: Self-pay

## 2023-07-15 ENCOUNTER — Telehealth: Payer: Self-pay

## 2023-07-15 NOTE — Telephone Encounter (Signed)
Received PA request, beginning benefits investigation for Emgality 120mg /ml    PA required--initiated in [facility] ([id number])    Will check back in 24-72 hours

## 2023-07-19 ENCOUNTER — Other Ambulatory Visit (HOSPITAL_BASED_OUTPATIENT_CLINIC_OR_DEPARTMENT_OTHER): Payer: Self-pay

## 2023-07-19 ENCOUNTER — Telehealth: Payer: Self-pay

## 2023-07-19 DIAGNOSIS — Z79899 Other long term (current) drug therapy: Secondary | ICD-10-CM

## 2023-07-19 DIAGNOSIS — G43009 Migraine without aura, not intractable, without status migrainosus: Secondary | ICD-10-CM

## 2023-07-19 MED ORDER — GALCANEZUMAB-GNLM 120 MG/ML SC SOAJ
1.0000 | SUBCUTANEOUS | 5 refills | Status: DC
Start: 2023-07-19 — End: 2023-10-23

## 2023-07-19 MED ORDER — SUMATRIPTAN SUCCINATE 100 MG PO TABS
100.0000 mg | ORAL_TABLET | ORAL | 5 refills | Status: AC | PRN
Start: 2023-07-19 — End: ?

## 2023-07-19 NOTE — Telephone Encounter (Signed)
 Patient contacted the call center requesting medication refill for Sumatriptan  100mg , propranolol  60 mg, galcanezumab -gnlm 120mg  injector.   Patient is requesting a call back to further discuss or for the medication refill to be forwarded to their pharmacy.    Per Pt, Pharmacy information in medical record is up to date.     New pharmacy information- [facility] - [address], [place] [address]. Phone number [phone]    Patient was  informed that medication refill requests should be called in up to one week prior to running out of medication.    Can someone please follow-up. Thank you

## 2023-07-19 NOTE — Telephone Encounter (Signed)
 PA APPROVED     Emgality 120mg /ml   #1/30 $20.00 copay     [employer]  PA#: [id number]   Effective: 07/15/2023-07/14/2024    Eligible for FA/fills with  Karin Golden #[id number]

## 2023-08-06 ENCOUNTER — Encounter (INDEPENDENT_AMBULATORY_CARE_PROVIDER_SITE_OTHER): Payer: Self-pay

## 2023-09-03 ENCOUNTER — Encounter (INDEPENDENT_AMBULATORY_CARE_PROVIDER_SITE_OTHER): Payer: Self-pay

## 2023-10-04 ENCOUNTER — Encounter (INDEPENDENT_AMBULATORY_CARE_PROVIDER_SITE_OTHER): Payer: Self-pay

## 2023-10-23 ENCOUNTER — Other Ambulatory Visit (HOSPITAL_BASED_OUTPATIENT_CLINIC_OR_DEPARTMENT_OTHER): Payer: Self-pay | Admitting: No Specialty

## 2023-10-23 DIAGNOSIS — Z79899 Other long term (current) drug therapy: Secondary | ICD-10-CM

## 2023-10-23 DIAGNOSIS — G43009 Migraine without aura, not intractable, without status migrainosus: Secondary | ICD-10-CM

## 2023-10-24 ENCOUNTER — Other Ambulatory Visit: Payer: Self-pay

## 2023-10-24 MED ORDER — GALCANEZUMAB-GNLM 120 MG/ML SC SOAJ
1.0000 | SUBCUTANEOUS | 4 refills | Status: AC
Start: 2023-10-24 — End: 2023-11-23

## 2023-11-03 NOTE — Progress Notes (Signed)
 Subjective:      Patient ID: Christopher Trevino is a 48 y.o. male.    Chief Complaint:  Chief Complaint   Patient presents with    Annual Exam     Pt is currently fasting.      HPI   Christopher Trevino, is presenting in clinic today for annual physical examination.  Patient is overall doing well.  Reports frequency and intensity of headaches has improved with Emgality  treatment.    Visit Type: Health Maintenance Visit  Work Status: working full-time [employer]  Reported Health: excellent health  Reported Diet: moderate compliance with well-balanced diet  Reported Exercise: regularly, 15-30 minutes/day, Biking, running and walking  Dental: regular dental visits twice a year  Vision: eye exam < 1 year ago  Hearing: normal hearing  Immunization Status: immunizations up to date COVID-19 booster due  Reproductive Health: sexually active  Prior Screening Tests: last colonoscopy in 2019 and 10 years follow-up recommended General Health Risks: no family history of prostate cancer and no family history of colon cancer  Safety Elements Used: uses seat belts, sunscreen use and does not text and drive    Problem List:  Patient Active Problem List   Diagnosis    Peritonsillar abscess    Hyperglycemia    History of migraine    Colitis    Rectal bleeding    Status post tonsillectomy    Attention deficit hyperactivity disorder (ADHD), unspecified ADHD type    Other migraine without status migrainosus, not intractable    Alcohol use, unspecified with unspecified alcohol-induced disorder    Numerous skin moles    Intractable migraine without aura and without status migrainosus       Current Medications:  Outpatient Medications Marked as Taking for the 11/04/23 encounter (Office Visit) with Marquisha Nikolov K, MD   Medication Sig Dispense Refill    Galcanezumab -gnlm (Emgality ) 120 MG/ML Solution Auto-injector Inject 1 .pen as directed every 30 (thirty) days      SUMAtriptan  (IMITREX ) 100 MG tablet Take 1 tablet (100 mg) by mouth every 2 (two) hours  as needed for Migraine 9 tablet 5       Allergies:  No Known Allergies    Past Medical History:  Past Medical History:   Diagnosis Date    Attention deficit hyperactivity disorder (ADHD)     History of migraine     LAST MIGRANE Aug 17 2017  EVEN 1 BEER OR GLASS OF WINE BRINGS IT ON       Past Surgical History:  Past Surgical History:   Procedure Laterality Date    INCISION & DRAINAGE, RETROPHARYNGEAL ABSCESS Left 07/01/2017    Procedure: INCISION & DRAINAGE, RETROPHARYNGEAL ABSCESS;  Surgeon: Romayne Barren, MD;  Location: [place] [facility] OR;  Service: ENT;  Laterality: Left;    NASAL SEPTUM SURGERY  AGE 43    TONSILLECTOMY N/A 08/31/2017    Procedure: TONSILLECTOMY AND EXCISION OF FRENULUM LESION;  Surgeon: Romayne Barren, MD;  Location: [place] [facility] OR;  Service: ENT;  Laterality: N/A;       Family History:  Family History   Problem Relation Name Age of Onset    Hyperlipidemia Mother      Hypertension Mother      Migraines Mother      Hyperlipidemia Father      Hypertension Father      No known problems Brother         Social History:  Social History     Tobacco Use  Smoking status: Former     Current packs/day: 0.00     Average packs/day: 0.5 packs/day for 3.0 years (1.5 ttl pk-yrs)     Types: Cigarettes     Start date: 05/09/1996     Quit date: 05/10/1999     Years since quitting: 24.5    Smokeless tobacco: Never   Vaping Use    Vaping status: Never Used   Substance Use Topics    Alcohol use: Yes     Alcohol/week: 0.0 - 2.0 standard drinks of alcohol     Comment: weekly    Drug use: No          The following sections were reviewed this encounter by the provider:   Tobacco  Allergies  Meds  Problems  Med Hx  Surg Hx  Fam Hx           Review of Systems   General/Constitutional:   Denies Change in appetite. Denies Chills. Denies Fatigue. Denies Fever. Denies Weight gain. Denies Weight loss.   Ophthalmologic:   Denies Blurred vision. Denies Diminished visual acuity. Denies Eye Pain.   ENT:   Denies Hearing  Loss. Denies Nasal Discharge. Denies Hoarseness. Denies Ear pain. Denies Nosebleed. Denies Sinus pain. Denies Sore throat. Denies Sneezing.   Endocrine:   Denies Polydipsia. Denies Polyuria.   Cardiovascular:   Denies Chest pain. Denies Chest pain with exertion. Denies Leg Claudication. Denies Palpitations. Denies Swelling in hands/feet.   Respiratory:   Denies Paroxysmal Nocturnal Dyspnea. Denies Cough. Denies Orthopnea. Denies Shortness of breath. Denies Daytime Hypersomnolence. Denies Snoring. Denies Witness Apnea. Denies Wheezing.   Gastrointestinal:   Denies Abdominal pain. Denies Blood in stool. Denies Constipation. Denies Diarrhea. Denies Heartburn. Denies Nausea. Denies Vomiting.   Hematology:   Denies Easy bruising. Denies Easy Bleeding.   Genitourinary:   Denies Blood in urine. Denies Nocturia.   Musculoskeletal:   Denies Joint pain. Denies Leg cramps. Denies Weakness in LE. Denies Swollen joints.   Peripheral Vascular:   Denies Cold extremities. Denies Decreased sensation in extremities. Denies Painful extremities.   Skin:   Denies Itching. Denies Change in Mole(s). Denies Rash.   Neurologic:   Denies Balance difficulty. Denies Dizziness. Denies Headache. Denies Pre-Syncope. Denies Memory loss.   Psychiatric:   Denies Anxiety. Denies Depressed mood. Denies Difficulty sleeping. Denies Mood Swings.    Objective:   Vitals:  BP 118/83 (BP Site: Left arm, Patient Position: Sitting, Cuff Size: Medium)   Pulse 74   Temp 98.2 F (36.8 C)   Ht 1.702 m (5' 7)   Wt 69 kg (152 lb 3.2 oz)   SpO2 98%   BMI 23.84 kg/m     Physical Exam   GENERAL APPEARANCE: alert, in no acute distress, well developed, well nourished, oriented to time, place, and person.   HEAD: normal appearance, atraumatic.   EYES: extraocular movement intact (EOMI), pupils equal, round, reactive to light and accommodation, sclera anicteric, conjunctiva clear.   EARS: tympanic membranes normal bilaterally, external canals normal .   NOSE:  normal nasal mucosa, no lesions.   ORAL CAVITY: normal oropharynx, normal lips, mucosa moist,   THROAT: normal appearance, clear, no erythema.   NECK/THYROID: neck supple, no carotid bruit,  no cervical lymphadenopathy, no neck mass palpated, no jugular venous distention, no thyromegaly.   SKIN: good turgor, no rashes, no suspicious lesions.   HEART: S1, S2 normal, no murmurs, rubs, gallops, regular rate and rhythm.   LUNGS: normal effort / no distress, normal breath  sounds, clear to auscultation bilaterally, no wheezes, rales, rhonchi.   ABDOMEN: bowel sounds present, no hepatosplenomegaly, soft, nontender, nondistended.   MUSCULOSKELETAL: full range of motion, no swelling or deformity.   EXTREMITIES: no edema, no clubbing, cyanosis, or edema.   PERIPHERAL PULSES: 2+ dorsalis pedis, 2+ posterior tibial.   NEUROLOGIC: nonfocal, cranial nerves 2-12 grossly intact, deep tendon reflexes 2+ symmetrical, normal strength, tone and reflexes, sensory exam intact.   PSYCH: cognitive function intact, mood/affect full range, speech clear.    Assessment/Plan:    1. Physical exam  - CBC with Differential (Order); Future  - Comprehensive Metabolic Panel; Future  - TSH; Future  - Lipid Panel; Future  - Hemoglobin A1C; Future  - PSA Total; Future  - CBC with Differential (Order)  - Comprehensive Metabolic Panel  - TSH  - Lipid Panel  - Hemoglobin A1C  - PSA Total  - Follow Up In Primary Care; Future    2. Migraine without status migrainosus, not intractable, unspecified migraine type    3. Attention deficit hyperactivity disorder (ADHD), unspecified ADHD type       Remains very active and in good health    He was on propranolol  before and recently been put on Emgality  which has been very helpful for prevention of migraines.  He is also on Imitrex  as needed  Follow-up with neurology    History of attention deficit hyperactivity disorder (ADHD), unspecified ADHD type  He is not on any treatment and is managing well.      Health  Maintenance:  2024 lab results reviewed with pt.  Immunizations UTD. Vision screening UTD. Dental Screening UTD. Colonoscopy is UTD. COVID vaccination recommended to pt.       [facility] 11/15/2017 f/u 10 years  Immunization History   Administered Date(s) Administered    COVID-19 Ad26 vaccine 18 years and above (Janssen) 0.5 mL 10/07/2019    Hepatitis B (pediatric/adolescent) 12/24/1993, 02/02/1994, 07/08/1994    INFLUENZA TRIVALENT PF SD 6 MO AND OLDER (FLUARIX /FLULAVAL ) 04/18/2023    INFLUENZA TRIVALENT PRESERVED MD 6 MO AND OLDER 04/13/2013    Influenza vaccine, quadrivalent, 3 years and older (FLUZONE), single-dose preservative free, 0.5 mL 05/27/2017    Td vaccine, tetanus and diphtheria toxoids, adsorbed, PF, adult, (2-2 Lf) 05/12/2001    Tdap (tetanus, diphtheria reduced, acellular pertussis), adsorbed vaccine 09/01/2012, 12/28/2015   He declines COVID boosters  .   Return in about 1 year (around 11/03/2024) for annual check up and earlier as needed.       I provided following Counseling/Anticipatory Guidance:    1. Importance of proper nutrition and maintaining healthy weight.       Increase vegetables, fruits, lentils, beans and fiber in your diet.       BMI goal; <30.   Blood Pressure goal: <140/90  2. Importance of aerobic/strengthening  Exercises and prevention of injuries.      Exercise at least 30 minutes 5 days per week.     Always wear your seatbelt in the car.     Wear sunscreen that is broad-spectrum (UVA/UVB) and at least SPF 30.  3. Importance of avoiding misuse of Tobacco, Alcohol, and mood altering drugs.       Avoid heavy drinking ( For Men < 14, and for Male < 7 drinks per week ).  4. Importance of proper dental and mental health.      Schedule dental exam and cleaning every 6 months      Have your vision  checked every 1-2 years.  5. Importance of proper Immunization as recommended by ACIP.      Get a flu vaccine yearly in the fall.      Td or Tdap vaccine every 10 years.   6.  Importance of proper Screening tests as recommended by USPSTF.      Colonoscopy test:  at least every 10 years starting at age 62.   7. Recommended screening Labs are ordered.    8. Suggest : Annual preventative visit.  This is different and separate from any problem-related visit.   9. Suggest  Website for medical information : [email or web address]   [facility] website :   [email or web address]        Bobbyjoe Pabst K Yoniel Arkwright, MD

## 2023-11-04 ENCOUNTER — Encounter (INDEPENDENT_AMBULATORY_CARE_PROVIDER_SITE_OTHER): Payer: Self-pay | Admitting: Internal Medicine

## 2023-11-04 ENCOUNTER — Ambulatory Visit (INDEPENDENT_AMBULATORY_CARE_PROVIDER_SITE_OTHER): Payer: Commercial Managed Care - POS | Admitting: Internal Medicine

## 2023-11-04 VITALS — BP 118/83 | HR 74 | Temp 98.2°F | Ht 67.0 in | Wt 152.2 lb

## 2023-11-04 DIAGNOSIS — G43909 Migraine, unspecified, not intractable, without status migrainosus: Secondary | ICD-10-CM

## 2023-11-04 DIAGNOSIS — Z0000 Encounter for general adult medical examination without abnormal findings: Secondary | ICD-10-CM

## 2023-11-04 DIAGNOSIS — F909 Attention-deficit hyperactivity disorder, unspecified type: Secondary | ICD-10-CM

## 2023-11-04 LAB — LAB USE ONLY - CBC WITH DIFFERENTIAL
Absolute Basophils: 0.04 x10 3/uL (ref 0.00–0.08)
Absolute Eosinophils: 0.07 x10 3/uL (ref 0.00–0.44)
Absolute Immature Granulocytes: 0.02 x10 3/uL (ref 0.00–0.07)
Absolute Lymphocytes: 1.26 x10 3/uL (ref 0.42–3.22)
Absolute Monocytes: 0.72 x10 3/uL (ref 0.21–0.85)
Absolute Neutrophils: 4.52 x10 3/uL (ref 1.10–6.33)
Absolute nRBC: 0 x10 3/uL (ref <=0.00)
Basophils %: 0.6 %
Eosinophils %: 1.1 %
Hematocrit: 46.9 % (ref 37.6–49.6)
Hemoglobin: 15.4 g/dL (ref 12.5–17.1)
Immature Granulocytes %: 0.3 %
Lymphocytes %: 19 %
MCH: 30.4 pg (ref 25.1–33.5)
MCHC: 32.8 g/dL (ref 31.5–35.8)
MCV: 92.7 fL (ref 78.0–96.0)
MPV: 11.4 fL (ref 8.9–12.5)
Monocytes %: 10.9 %
Neutrophils %: 68.1 %
Platelet Count: 224 x10 3/uL (ref 142–346)
Preliminary Absolute Neutrophil Count: 4.52 x10 3/uL (ref 1.10–6.33)
RBC: 5.06 x10 6/uL (ref 4.20–5.90)
RDW: 12 % (ref 11–15)
WBC: 6.63 x10 3/uL (ref 3.10–9.50)
nRBC %: 0 /100{WBCs} (ref <=0.0)

## 2023-11-04 LAB — COMPREHENSIVE METABOLIC PANEL
ALT: 26 U/L (ref <=55)
AST (SGOT): 22 U/L (ref <=41)
Albumin/Globulin Ratio: 1.3 (ref 0.9–2.2)
Albumin: 3.9 g/dL (ref 3.5–5.0)
Alkaline Phosphatase: 82 U/L (ref 37–117)
Anion Gap: 9 (ref 5.0–15.0)
BUN: 20 mg/dL (ref 9–28)
Bilirubin, Total: 0.6 mg/dL (ref 0.2–1.2)
CO2: 28 meq/L (ref 17–29)
Calcium: 9.8 mg/dL (ref 8.5–10.5)
Chloride: 103 meq/L (ref 99–111)
Creatinine: 1.1 mg/dL (ref 0.5–1.5)
GFR: >60 mL/min/1.73 m2 (ref >=60.0)
Globulin: 3 g/dL (ref 2.0–3.6)
Glucose: 96 mg/dL (ref 70–100)
Hemolysis Index: 5 {index}
Potassium: 4.8 meq/L (ref 3.5–5.3)
Protein, Total: 6.9 g/dL (ref 6.0–8.3)
Sodium: 140 meq/L (ref 135–145)

## 2023-11-04 LAB — HEMOGLOBIN A1C
Average Estimated Glucose: 99.7 mg/dL
Hemoglobin A1C: 5.1 % (ref 4.6–5.6)

## 2023-11-04 LAB — PSA TOTAL: Prostate Specific Antigen, Total: 0.3 ng/mL (ref 0.000–4.000)

## 2023-11-04 LAB — LIPID PANEL
Cholesterol / HDL Ratio: 2.1 {index}
Cholesterol: 157 mg/dL (ref <=199)
HDL: 74 mg/dL (ref >=40)
LDL Calculated: 72 mg/dL (ref 0–99)
Triglycerides: 54 mg/dL (ref 34–149)
VLDL Calculated: 11 mg/dL (ref 10–40)

## 2023-11-04 LAB — TSH: TSH: 0.6 u[IU]/mL (ref 0.35–4.94)

## 2023-11-05 ENCOUNTER — Ambulatory Visit (INDEPENDENT_AMBULATORY_CARE_PROVIDER_SITE_OTHER): Payer: Self-pay | Admitting: Internal Medicine

## 2023-12-12 ENCOUNTER — Ambulatory Visit (INDEPENDENT_AMBULATORY_CARE_PROVIDER_SITE_OTHER): Admitting: Family Medicine

## 2023-12-12 ENCOUNTER — Encounter (INDEPENDENT_AMBULATORY_CARE_PROVIDER_SITE_OTHER): Payer: Self-pay | Admitting: Family Medicine

## 2023-12-12 VITALS — BP 139/88 | HR 71 | Temp 98.0°F | Resp 18 | Wt 154.8 lb

## 2023-12-12 DIAGNOSIS — R3915 Urgency of urination: Secondary | ICD-10-CM

## 2023-12-12 LAB — URINALYSIS WITH MICROSCOPIC EXAM
Urine Bilirubin: NEGATIVE
Urine Blood: NEGATIVE
Urine Glucose: NEGATIVE
Urine Ketones: NEGATIVE mg/dL
Urine Leukocyte Esterase: NEGATIVE
Urine Nitrite: NEGATIVE
Urine Protein: NEGATIVE
Urine Specific Gravity: 1.008 (ref 1.001–1.035)
Urine Urobilinogen: NORMAL mg/dL (ref 0.2–2.0)
Urine pH: 7 (ref 5.0–8.0)

## 2023-12-12 NOTE — Progress Notes (Signed)
 Subjective:      Patient ID: Christopher Trevino  is a 48 y.o.  male.     Christopher Trevino is a 48 y.o. male presenting for   Chief Complaint   Patient presents with    Urinary Tract Infection Symptoms     Since yesterday pt reports back to back urgency, no burning when urinating, does report had higher salt in take over the weekend.        HPI    Christopher Trevino is here to follow-up on following concern.    Urinary urgency, patient reports since last night he has been experiencing frequent urinary urgency.  - He woke up 4-5 times last night almost every hours to urinate.  - No dysuria, no difficulties while urinating, no bladder pain or spasm, no flank pain, no low back pain.  - Good amount of urination, urine color normal, no hematuria, no strong odor.  -No urinary incontinent.  -No fever or chills.  No nausea or vomiting.  No diaphoresis.  - He has not been drinking excessive fluid prior to symptoms started.  - No history of BPH or overactive bladder.  - Urine dip done at our office unremarkable.  No infection noted.    The following portions of the patient's history were reviewed and updated as appropriate: current medications, allergies, past family history, past medical history, past social history, past surgical history and problem list.     Review of Systems   Constitutional:  Negative for activity change, chills, fatigue and fever.   HENT:  Negative for congestion, trouble swallowing and voice change.    Eyes:  Negative for photophobia and visual disturbance.   Respiratory:  Negative for chest tightness and shortness of breath.    Cardiovascular:  Negative for chest pain, palpitations and leg swelling.   Gastrointestinal:  Negative for abdominal pain, nausea and vomiting.   Genitourinary:  Positive for frequency and urgency. Negative for decreased urine volume, difficulty urinating, dysuria, flank pain, genital sores, hematuria, penile discharge, penile pain, penile swelling, scrotal swelling and testicular  pain.   Musculoskeletal:  Negative for back pain and gait problem.   Skin:  Negative for rash.   Neurological:  Negative for dizziness, syncope, light-headedness and headaches.   Hematological:  Negative for adenopathy.          BP 139/88 (BP Site: Left arm, Patient Position: Sitting, Cuff Size: Large)   Pulse 71   Temp 98 F (36.7 C) (Oral)   Resp 18   Wt 70.2 kg (154 lb 12.8 oz)   SpO2 98%   BMI 24.25 kg/m     Objective:     Physical Exam  Constitutional:       General: He is not in acute distress.  HENT:      Head: Normocephalic.      Nose: Nose normal.      Mouth/Throat:      Mouth: Mucous membranes are moist.   Eyes:      General: No scleral icterus.     Extraocular Movements: Extraocular movements intact.      Conjunctiva/sclera: Conjunctivae normal.      Pupils: Pupils are equal, round, and reactive to light.   Neck:      Thyroid: No thyromegaly.   Cardiovascular:      Rate and Rhythm: Normal rate and regular rhythm.      Heart sounds: Normal heart sounds.   Pulmonary:      Effort: Pulmonary effort is  normal. No respiratory distress.      Breath sounds: Normal breath sounds. No wheezing.   Abdominal:      General: Bowel sounds are normal. There is no distension.      Palpations: Abdomen is soft.      Tenderness: There is no abdominal tenderness. There is no right CVA tenderness, left CVA tenderness, guarding or rebound.      Comments: No suprapubic bladder fullness or tenderness.  Abdomen and pelvic area soft, nontender, not distended.  Bowel sound positive.   Musculoskeletal:         General: Normal range of motion.      Cervical back: Normal range of motion and neck supple.   Lymphadenopathy:      Cervical: No cervical adenopathy.   Skin:     General: Skin is warm.      Comments: Skin turgor normal   Neurological:      General: No focal deficit present.      Mental Status: He is alert and oriented to person, place, and time.      Deep Tendon Reflexes: Reflexes are normal and symmetric.    Psychiatric:         Mood and Affect: Mood normal.         Behavior: Behavior normal.         Thought Content: Thought content normal.         Judgment: Judgment normal.           Assessment/Plan:     Urinary urgency since last night, no dysuria or any other associated symptoms  - Unlikely UTI.  Overactive bladder versus others  - Urine dip done at our office unremarkable.  Will follow-up UA and urine culture.  -No antibiotic prescribed, will wait for urine culture to come back.  Patient agreeable with plan.  - Patient will continue to monitor symptoms.  If no improvement or worsening symptom patient will follow-up.  - Reviewed signs symptom in detail that necessitate urgent evaluation.  Patient verbally understands.    Risk & Benefits of the new medication(s) were explained to the patient (and family) who verbalized understanding & agreed to the treatment plan. Patient (family) encouraged to contact me/clinical staff with any questions/concerns.    Call if you develop any new or worsening symptoms  Proceed to urgent care or ED after hours if necessary    Arty Lantzy, MD        Return if symptoms worsen or fail to improve.

## 2023-12-12 NOTE — Progress Notes (Signed)
 Have you seen any specialists/other providers since your last visit with [place]?    No      The patient was informed that the following HM items are still outstanding:   There are no preventive care reminders to display for this patient.

## 2023-12-13 ENCOUNTER — Ambulatory Visit (INDEPENDENT_AMBULATORY_CARE_PROVIDER_SITE_OTHER): Payer: Self-pay | Admitting: Family Medicine

## 2023-12-13 NOTE — Progress Notes (Signed)
 Hi      Urine analysis unremarkable.  No infection noted.  Will follow-up urine culture.

## 2023-12-14 LAB — CULTURE, URINE: Culture Urine: NO GROWTH

## 2023-12-14 NOTE — Progress Notes (Signed)
 Hi    Your urine culture negative for any bacterial infection.  If you continue urinary symptoms, please follow-up with us .

## 2024-01-26 ENCOUNTER — Other Ambulatory Visit: Payer: Self-pay

## 2024-01-26 ENCOUNTER — Encounter: Payer: Self-pay | Admitting: No Specialty

## 2024-01-26 MED ORDER — EMGALITY 120 MG/ML SC SOAJ: 1.0000 | SUBCUTANEOUS | 5 refills | Status: DC

## 2024-03-29 ENCOUNTER — Other Ambulatory Visit (INDEPENDENT_AMBULATORY_CARE_PROVIDER_SITE_OTHER): Payer: Self-pay | Admitting: Internal Medicine

## 2024-04-23 ENCOUNTER — Other Ambulatory Visit: Payer: Self-pay | Admitting: No Specialty

## 2024-05-12 ENCOUNTER — Other Ambulatory Visit (INDEPENDENT_AMBULATORY_CARE_PROVIDER_SITE_OTHER): Payer: Self-pay | Admitting: Family Nurse Practitioner

## 2024-05-12 ENCOUNTER — Ambulatory Visit (INDEPENDENT_AMBULATORY_CARE_PROVIDER_SITE_OTHER): Admitting: Family Nurse Practitioner

## 2024-05-12 ENCOUNTER — Encounter (INDEPENDENT_AMBULATORY_CARE_PROVIDER_SITE_OTHER): Payer: Self-pay | Admitting: Family Nurse Practitioner

## 2024-05-12 VITALS — BP 132/81 | HR 80 | Temp 98.7°F | Resp 18 | Ht 67.0 in | Wt 150.0 lb

## 2024-05-12 DIAGNOSIS — B9689 Other specified bacterial agents as the cause of diseases classified elsewhere: Secondary | ICD-10-CM

## 2024-05-12 DIAGNOSIS — J019 Acute sinusitis, unspecified: Secondary | ICD-10-CM

## 2024-05-12 MED ORDER — ONDANSETRON 4 MG PO TBDP
4.0000 mg | ORAL_TABLET | Freq: Four times a day (QID) | ORAL | 0 refills | Status: AC | PRN
Start: 2024-05-12 — End: 2024-05-19

## 2024-05-12 MED ORDER — CEFDINIR 300 MG PO CAPS
300.0000 mg | ORAL_CAPSULE | Freq: Two times a day (BID) | ORAL | 0 refills | Status: AC
Start: 2024-05-12 — End: 2024-05-19

## 2024-05-12 MED ORDER — AMOXICILLIN-POT CLAVULANATE 875-125 MG PO TABS
1.0000 | ORAL_TABLET | Freq: Two times a day (BID) | ORAL | 0 refills | Status: DC
Start: 2024-05-12 — End: 2024-05-12

## 2024-05-12 NOTE — Progress Notes (Signed)
 [facility]  OFFICE NOTE         Subjective   Historian: Patient      Chief Complaint   Patient presents with    Nasal Congestion     Patient c/o sinus congestion/pain,headache, bilateral ear fullness x 1 week. Hx of sinus infections. Tylenol  cold/sinus OTC this morning.      Patient is a 48 year old male who presents to urgent care with complaints of possible sinus infection. Patient reports for the past week has had nasal congestion and post nasal drainage. He started to feel better then symptoms worsened few days ago with headache, sinus pressure and ear pressure. Patient reports he has been taking OTC mucinex , cold/sinus medication. Patient did neti-pot this morning which did provide some relief. No fever, chills, sore throat, cough.          History:  Medications and Allergies reviewed.   Pertinent Past Medical, Surgical, Family and Social History were reviewed.        Objective     Vitals:    05/12/24 1026   BP: 132/81   BP Site: Right arm   Patient Position: Sitting   Cuff Size: Medium   Pulse: 80   Resp: 18   Temp: 98.7 F (37.1 C)   TempSrc: Tympanic   SpO2: 98%   Weight: 68 kg (150 lb)   Height: 1.702 m (5' 7)      Body mass index is 23.49 kg/m.          Physical Exam  Constitutional:       General: He is not in acute distress.     Appearance: Normal appearance. He is not ill-appearing, toxic-appearing or diaphoretic.   HENT:      Head: Normocephalic and atraumatic.      Right Ear: Tympanic membrane, ear canal and external ear normal.      Left Ear: Ear canal and external ear normal. A middle ear effusion is present.      Nose: Congestion present.      Mouth/Throat:      Lips: Pink.      Mouth: Mucous membranes are moist.      Pharynx: Oropharynx is clear. Uvula midline. Postnasal drip present. No pharyngeal swelling, oropharyngeal exudate, posterior oropharyngeal erythema or uvula swelling.   Cardiovascular:      Rate and Rhythm: Normal rate and regular rhythm.      Pulses: Normal  pulses.      Heart sounds: Normal heart sounds. No murmur heard.  Pulmonary:      Effort: Pulmonary effort is normal. No respiratory distress.      Breath sounds: Normal breath sounds. No stridor. No wheezing, rhonchi or rales.   Musculoskeletal:      Cervical back: Neck supple.   Neurological:      General: No focal deficit present.      Mental Status: He is alert and oriented to person, place, and time.   Skin:     General: Skin is warm and dry.   Psychiatric:         Behavior: Behavior normal.     Urgent Care Course   There were no labs reviewed with this patient during the visit.    There were no x-rays reviewed with this patient during the visit.      Procedures   Procedures     Assessment / Plan     Differential Diagnoses including but not limited to: viral sinusitis vs bacterial sinusitis vs  viral uri vs allergic rhinitis vs otitis media vs middle ear effusion    Acute bacterial sinusitis  Rx for Augmentin e-prescribed to patient's pharmacy on file  Continue neti-pot saline nasal rinses for congestion relief  Recommend warm steamy showers for congestion relief  Recommend rest, optimize oral hydration with warm fluids such as hot tea honey with ginger  Recommend over-the-counter ibuprofen or tylenol  PRN for pain/fever  Follow up with primary care provider or return to urgent care for any persistent or worsening symptoms  Patient verbalized understanding and is in agreement with plan of care  All questions addressed      [place] was seen today for nasal congestion.    Diagnoses and all orders for this visit:    Acute bacterial sinusitis  -     amoxicillin -clavulanate (AUGMENTIN) 875-125 MG per tablet; Take 1 tablet by mouth 2 (two) times daily for 7 days         The indications for early follow-up with PCP and return to UC were discussed. Patient/family received education on the working diagnosis, diagnostic uncertainties, and proposed treatment plan. Indications for emergency evaluation in the ED were reviewed.  Written and verbal discharge instructions were provided and discussed and all questions from the patient/family were addressed, with no apparent barriers.

## 2024-06-25 ENCOUNTER — Other Ambulatory Visit: Payer: Self-pay

## 2024-07-22 ENCOUNTER — Other Ambulatory Visit: Payer: Self-pay | Admitting: No Specialty

## 2024-07-23 ENCOUNTER — Other Ambulatory Visit: Payer: Self-pay | Admitting: No Specialty

## 2024-07-31 ENCOUNTER — Ambulatory Visit: Attending: Neurology | Admitting: No Specialty

## 2024-07-31 ENCOUNTER — Encounter: Payer: Self-pay | Admitting: No Specialty

## 2024-07-31 ENCOUNTER — Other Ambulatory Visit: Payer: Self-pay

## 2024-07-31 VITALS — BP 152/87 | HR 71 | Temp 98.0°F | Resp 12 | Ht 67.0 in | Wt 155.0 lb

## 2024-07-31 DIAGNOSIS — Z79899 Other long term (current) drug therapy: Secondary | ICD-10-CM

## 2024-07-31 DIAGNOSIS — G43009 Migraine without aura, not intractable, without status migrainosus: Secondary | ICD-10-CM

## 2024-07-31 MED ORDER — EMGALITY 120 MG/ML SC SOAJ: 1.0000 | SUBCUTANEOUS | 11 refills | Status: AC

## 2024-07-31 NOTE — Patient Instructions (Signed)
Our plan:     Continue current medications        Today's Visit:      In today's visit, I reviewed your medications and records relating your health - prior testing, blood work, reports of other health care providers present in your electronic medical record.     If you have pertinent records from any non-[facility] doctors that you would like to review, please have them sent to us or bring to them to your next office visit.     A copy of today's visit will be sent to your referring doctor and/or primary care doctor, if you have one listed in our system.    Let me know if there are things we could have done better for your office visit.    Patient satisfaction survey:      If you receive a patient satisfaction survey, I would greatly appreciate it if you would complete it. We value your feedback.     Contact me online:      Patient Portal online - Please sign up for MyChart -- this is the best way to communicate with your team here.  There is a messaging feature, where you can us messages anytime of day.  It is the best way to communicate with us and get test results, medication refills, or ask questions.     You can expect to get a response within 24-48 hours during weekdays.  If you do not receive a timely response, resend your request and inform us. My goal is for every question answered ever day.  Average response for a phone call maybe 3 days due to the volume we receive (which is why MyChart is preferred).    If you are having a medical emergency -- call 911, DO NOT SEND A MESSAGE THROUGH MYCHART.     Coupons for medication:      If you have any trouble affording your medications, check out www.goodrx.com for coupons and competitive prices in your area.  If you need further assistance, let us know so we can work with you and your insurance to make sure you get the best care.    Thank you for trusting me with your health.      Take care,  Allina Riches, MD  [facility]   [facility]: [phone]  [place]: [phone]

## 2024-07-31 NOTE — Progress Notes (Signed)
 Prior Authorization Details    Which medication is the prior authorization for? Emgality  120 auto injector       What is the patient's pharmacy benefit? [employer]       Prior Auth Status: Submitted       Submission Method: [facility])

## 2024-07-31 NOTE — Progress Notes (Signed)
 The prescription has been received by the Pharmacy Care Team. A benefit investigation is currently in process.    To follow up on the outcome of the benefit investigation, please check Episodes, Referrals or specialty pharmacy encounter section in the Patient chart review

## 2024-11-07 ENCOUNTER — Encounter (INDEPENDENT_AMBULATORY_CARE_PROVIDER_SITE_OTHER): Admitting: Internal Medicine
